# Patient Record
Sex: Male | Born: 1940 | Race: White | Hispanic: No | Marital: Married | Smoking: Former smoker
Health system: Southern US, Community
[De-identification: ages and names within clinical notes are randomized; demographics above are authoritative.]

## PROBLEM LIST (undated history)

## (undated) DIAGNOSIS — N400 Enlarged prostate without lower urinary tract symptoms: Secondary | ICD-10-CM

## (undated) DIAGNOSIS — K589 Irritable bowel syndrome without diarrhea: Secondary | ICD-10-CM

## (undated) DIAGNOSIS — K219 Gastro-esophageal reflux disease without esophagitis: Secondary | ICD-10-CM

## (undated) DIAGNOSIS — T7840XA Allergy, unspecified, initial encounter: Secondary | ICD-10-CM

## (undated) DIAGNOSIS — M109 Gout, unspecified: Secondary | ICD-10-CM

## (undated) DIAGNOSIS — F32A Depression, unspecified: Secondary | ICD-10-CM

## (undated) DIAGNOSIS — E785 Hyperlipidemia, unspecified: Secondary | ICD-10-CM

## (undated) DIAGNOSIS — M810 Age-related osteoporosis without current pathological fracture: Secondary | ICD-10-CM

## (undated) DIAGNOSIS — R718 Other abnormality of red blood cells: Secondary | ICD-10-CM

## (undated) DIAGNOSIS — F419 Anxiety disorder, unspecified: Secondary | ICD-10-CM

## (undated) DIAGNOSIS — Z8619 Personal history of other infectious and parasitic diseases: Secondary | ICD-10-CM

## (undated) DIAGNOSIS — N529 Male erectile dysfunction, unspecified: Secondary | ICD-10-CM

## (undated) DIAGNOSIS — M1990 Unspecified osteoarthritis, unspecified site: Secondary | ICD-10-CM

## (undated) DIAGNOSIS — H269 Unspecified cataract: Secondary | ICD-10-CM

## (undated) DIAGNOSIS — K635 Polyp of colon: Secondary | ICD-10-CM

## (undated) HISTORY — DX: Personal history of other infectious and parasitic diseases: Z86.19

## (undated) HISTORY — DX: Polyp of colon: K63.5

## (undated) HISTORY — DX: Anxiety disorder, unspecified: F41.9

## (undated) HISTORY — DX: Unspecified osteoarthritis, unspecified site: M19.90

## (undated) HISTORY — PX: POLYPECTOMY: SHX149

## (undated) HISTORY — DX: Gout, unspecified: M10.9

## (undated) HISTORY — DX: Unspecified cataract: H26.9

## (undated) HISTORY — PX: FRACTURE SURGERY: SHX138

## (undated) HISTORY — PX: BUNIONECTOMY: SHX129

## (undated) HISTORY — PX: CATARACT EXTRACTION, BILATERAL: SHX1313

## (undated) HISTORY — DX: Age-related osteoporosis without current pathological fracture: M81.0

## (undated) HISTORY — PX: COSMETIC SURGERY: SHX468

## (undated) HISTORY — PX: SHOULDER ARTHROSCOPY: SHX128

## (undated) HISTORY — PX: JOINT REPLACEMENT: SHX530

## (undated) HISTORY — PX: EYE SURGERY: SHX253

## (undated) HISTORY — DX: Irritable bowel syndrome, unspecified: K58.9

## (undated) HISTORY — DX: Hyperlipidemia, unspecified: E78.5

## (undated) HISTORY — PX: PROSTATE SURGERY: SHX751

## (undated) HISTORY — DX: Gastro-esophageal reflux disease without esophagitis: K21.9

## (undated) HISTORY — DX: Allergy, unspecified, initial encounter: T78.40XA

## (undated) HISTORY — DX: Depression, unspecified: F32.A

## (undated) HISTORY — PX: UMBILICAL HERNIA REPAIR: SHX196

## (undated) HISTORY — PX: INGUINAL HERNIA REPAIR: SUR1180

## (undated) HISTORY — PX: NOSE SURGERY: SHX723

## (undated) HISTORY — DX: Benign prostatic hyperplasia without lower urinary tract symptoms: N40.0

## (undated) HISTORY — PX: COLONOSCOPY: SHX174

## (undated) HISTORY — DX: Other abnormality of red blood cells: R71.8

---

## 1982-11-19 HISTORY — PX: OTHER SURGICAL HISTORY: SHX169

## 1985-11-19 HISTORY — PX: OTHER SURGICAL HISTORY: SHX169

## 1995-11-20 HISTORY — PX: CHOLECYSTECTOMY: SHX55

## 1998-11-19 DIAGNOSIS — A4902 Methicillin resistant Staphylococcus aureus infection, unspecified site: Secondary | ICD-10-CM

## 1998-11-19 HISTORY — DX: Methicillin resistant Staphylococcus aureus infection, unspecified site: A49.02

## 2000-11-19 HISTORY — PX: TONSILLECTOMY AND ADENOIDECTOMY: SUR1326

## 2006-02-05 ENCOUNTER — Ambulatory Visit: Payer: Self-pay | Admitting: Unknown Physician Specialty

## 2007-02-18 ENCOUNTER — Ambulatory Visit: Payer: Self-pay | Admitting: Otolaryngology

## 2007-02-18 ENCOUNTER — Other Ambulatory Visit: Payer: Self-pay

## 2007-02-25 ENCOUNTER — Ambulatory Visit: Payer: Self-pay | Admitting: Otolaryngology

## 2009-03-15 DIAGNOSIS — M109 Gout, unspecified: Secondary | ICD-10-CM | POA: Insufficient documentation

## 2009-03-29 ENCOUNTER — Ambulatory Visit: Payer: Self-pay | Admitting: Family Medicine

## 2009-03-29 DIAGNOSIS — R5381 Other malaise: Secondary | ICD-10-CM | POA: Insufficient documentation

## 2009-03-29 IMAGING — CR DG RIBS 2V*R*
1 series · 4 of 4 positions shown · non-contrast
Comparison: none

REASON FOR EXAM: Rib Pain
COMMENTS:

PROCEDURE:     KDR - KDXR RIBS RIGHT UNILATERAL  - [DATE] [DATE]
RESULT:     The images of the right ribs demonstrate no fracture,
dislocation or destructive bony lesion.

[Series 1: view not recorded · 0.17mm/px · 4 of 4 slices shown]
[im 1/4]
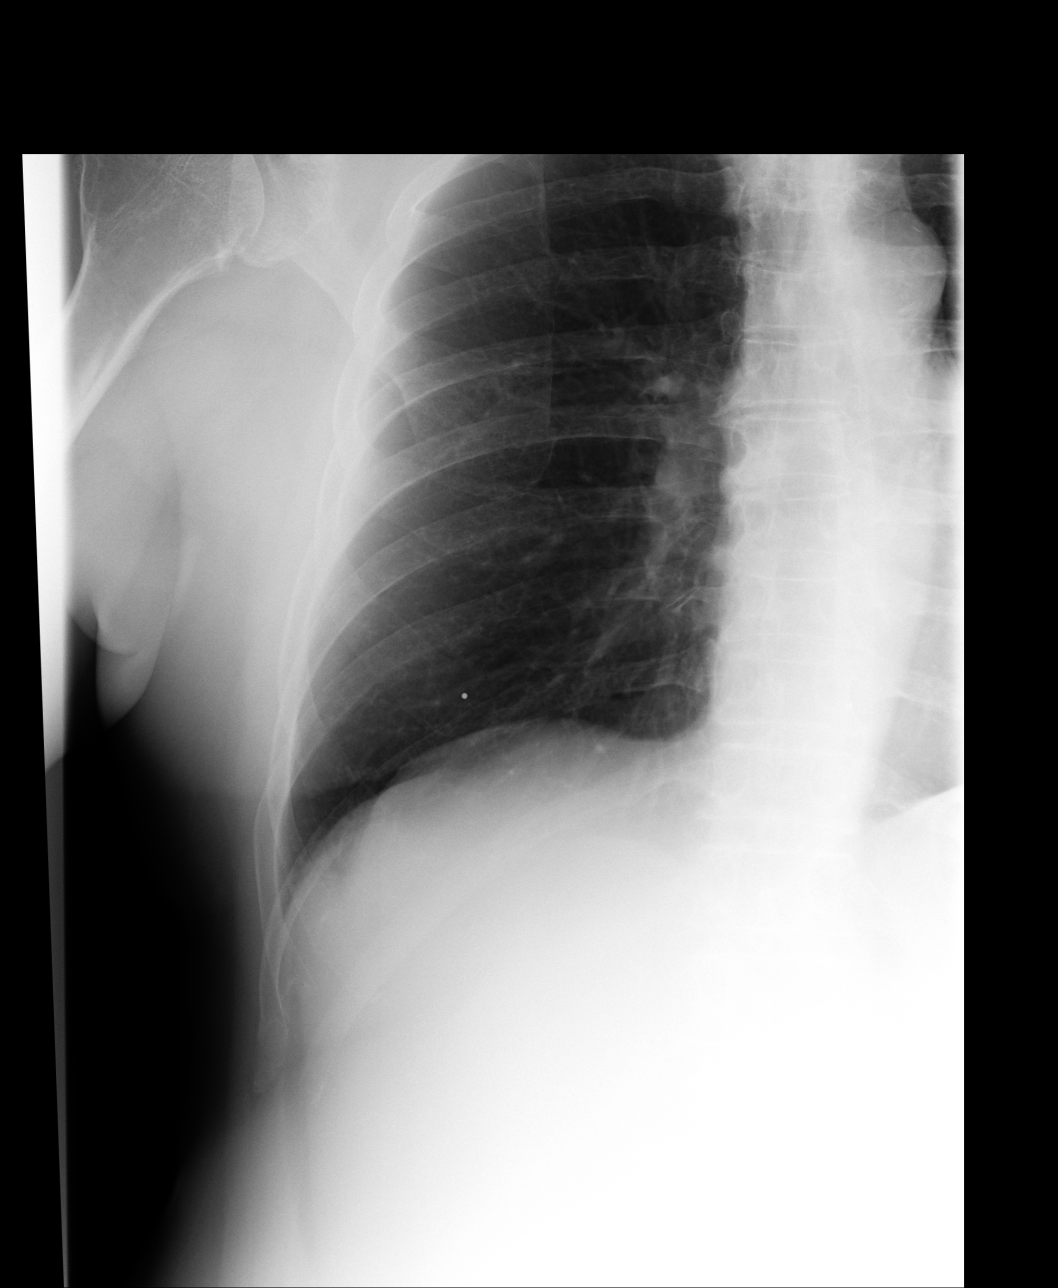
[im 2/4]
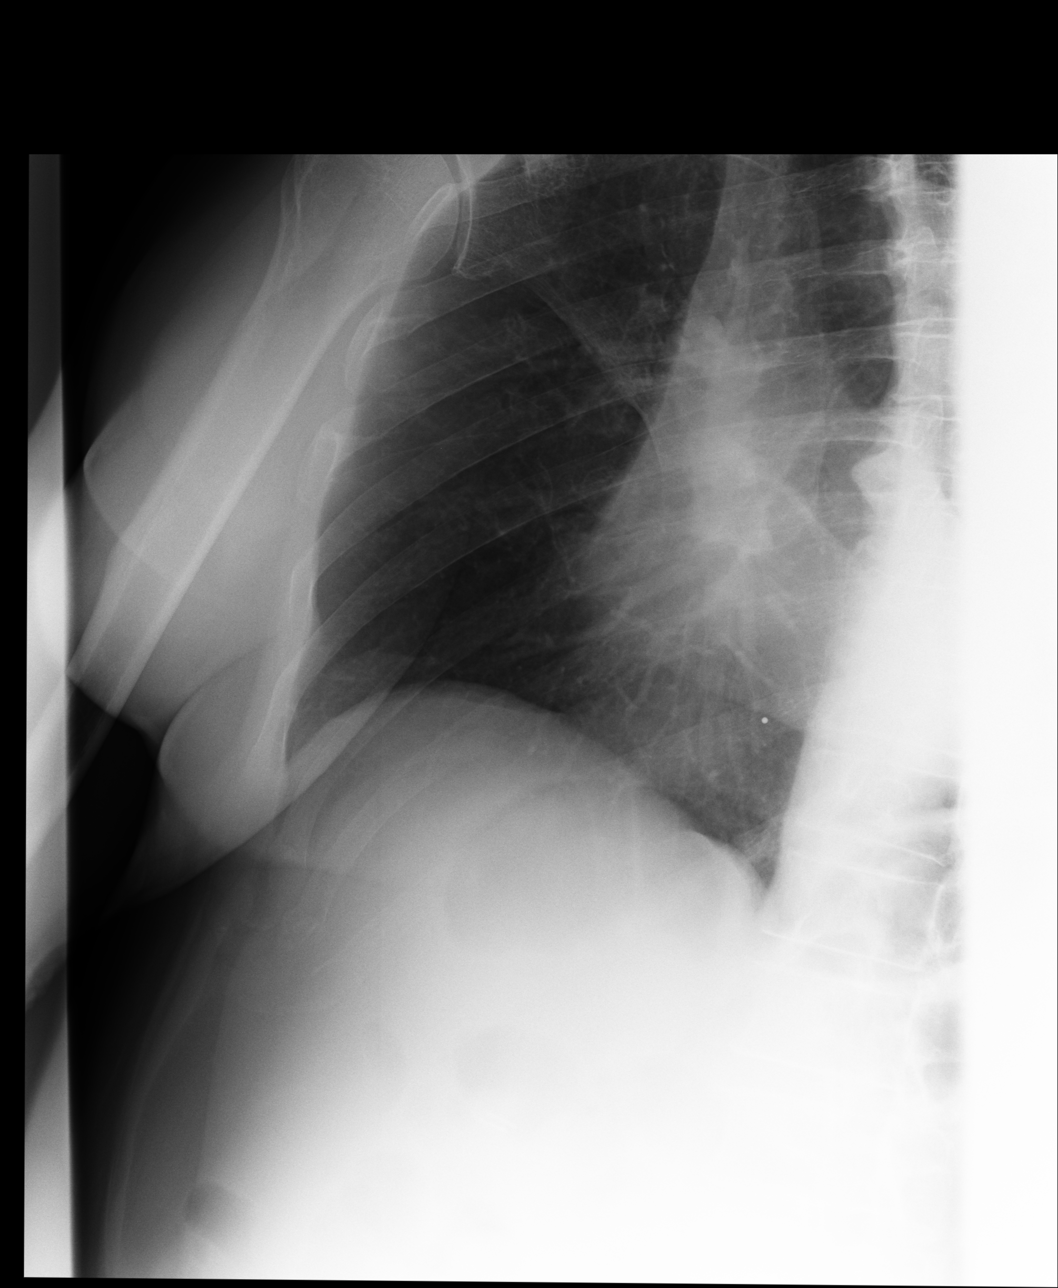
[im 3/4]
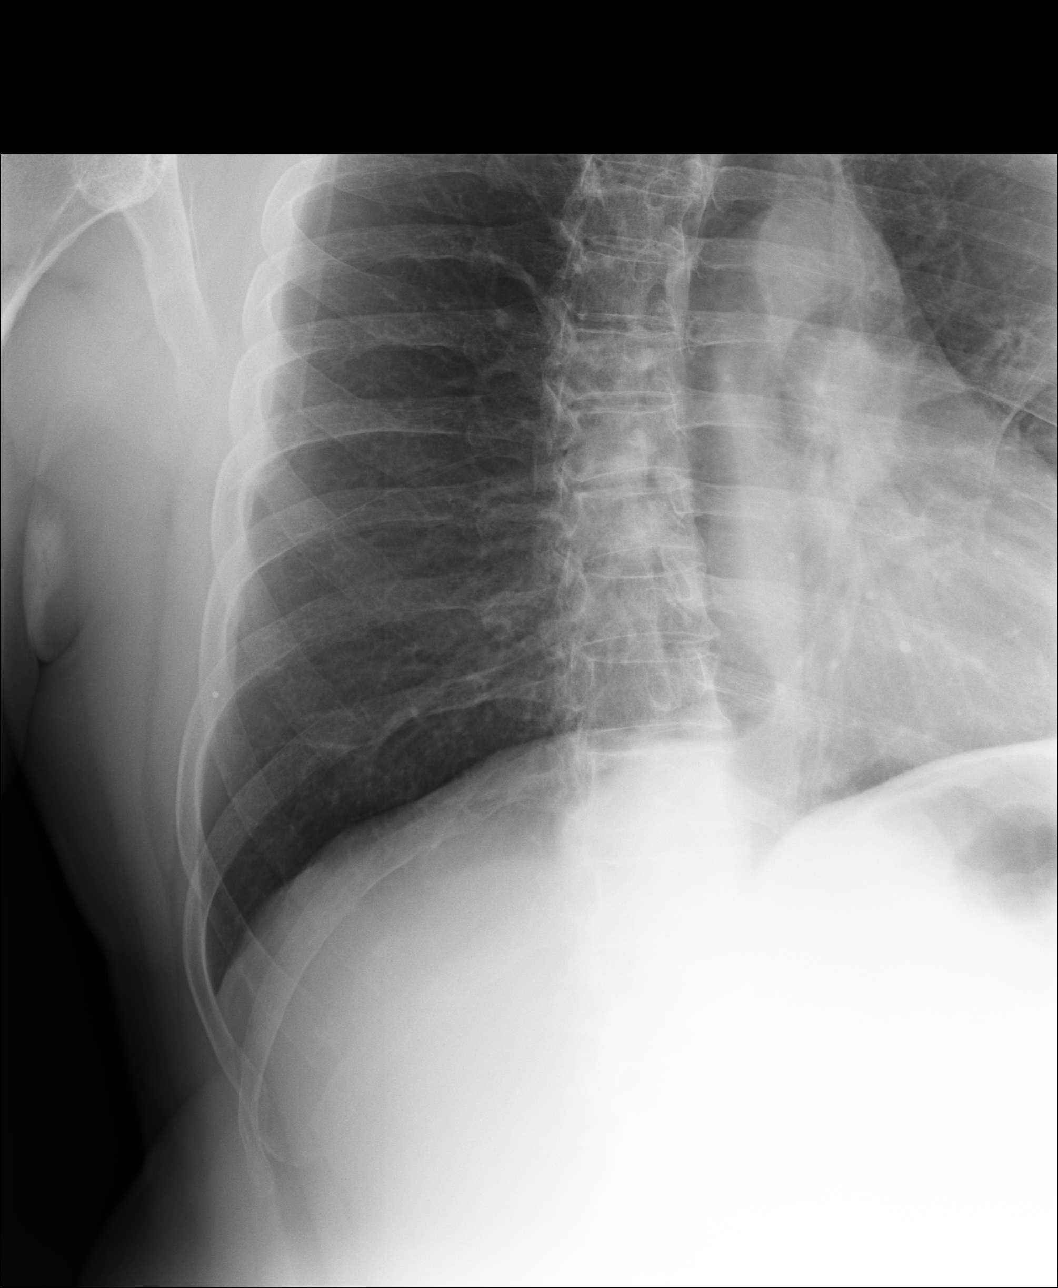
[im 4/4]
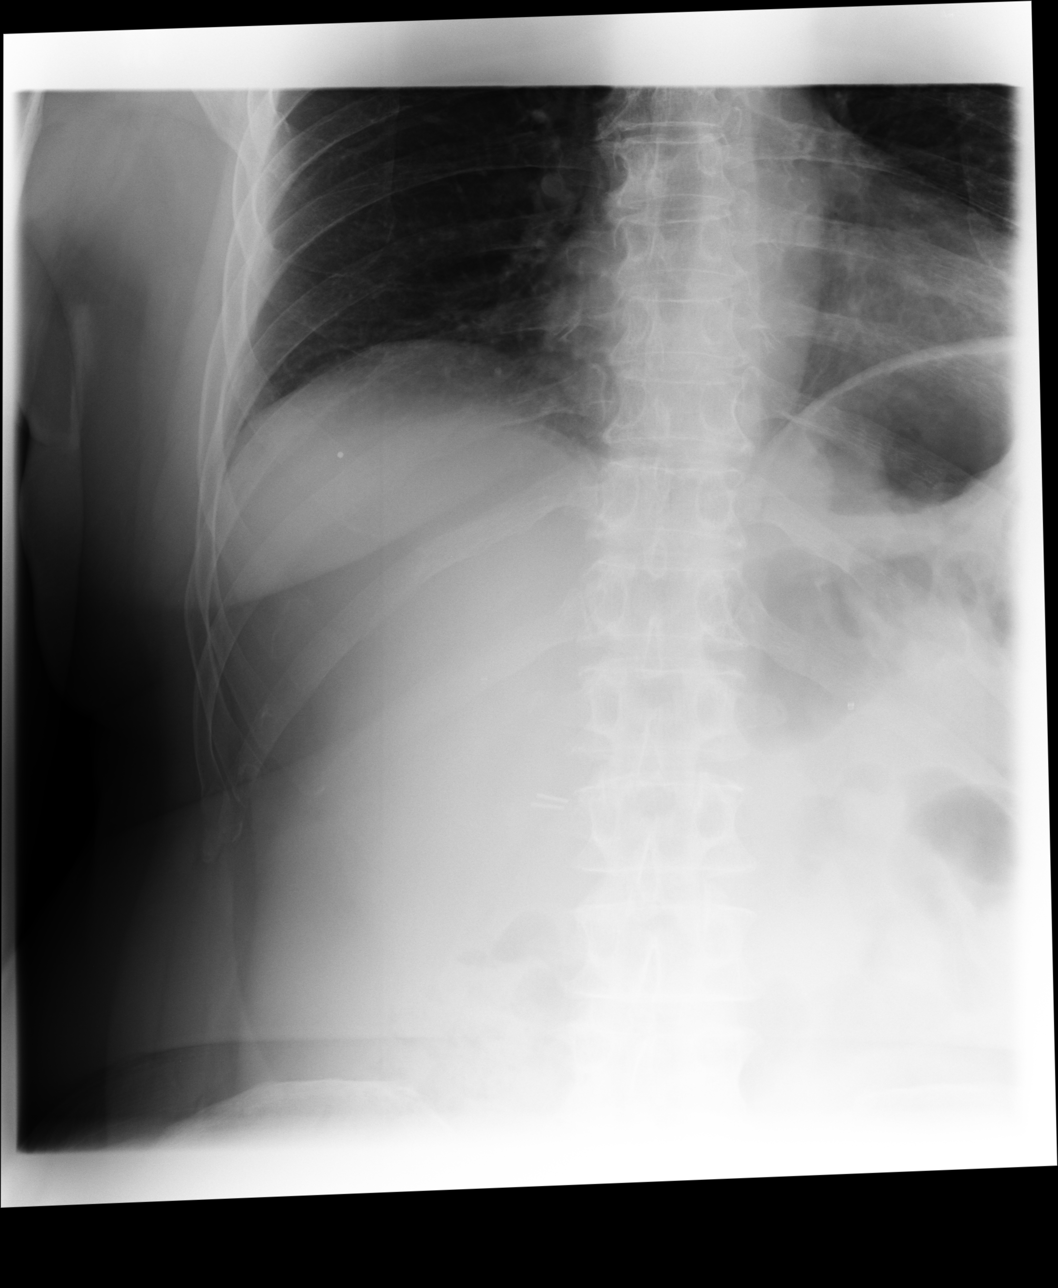

[4 of 4 positions shown; findings below may reference images not displayed]

IMPRESSION: No acute bony abnormality evident.

## 2009-03-29 IMAGING — CR DG THORACIC SPINE 2-3V
2 series · 3 of 3 positions shown · non-contrast
Comparison: none

REASON FOR EXAM: Back Pain
COMMENTS:

PROCEDURE:     KDR - KDXR THORACIC AP AND LATERAL  - [DATE] [DATE]
RESULT:     AP and lateral projections of the thoracic spine demonstrate no
compression fracture or subluxation. Hypertrophic degenerative endplate
spurring is demonstrated.

[view not recorded (1 of 2)]
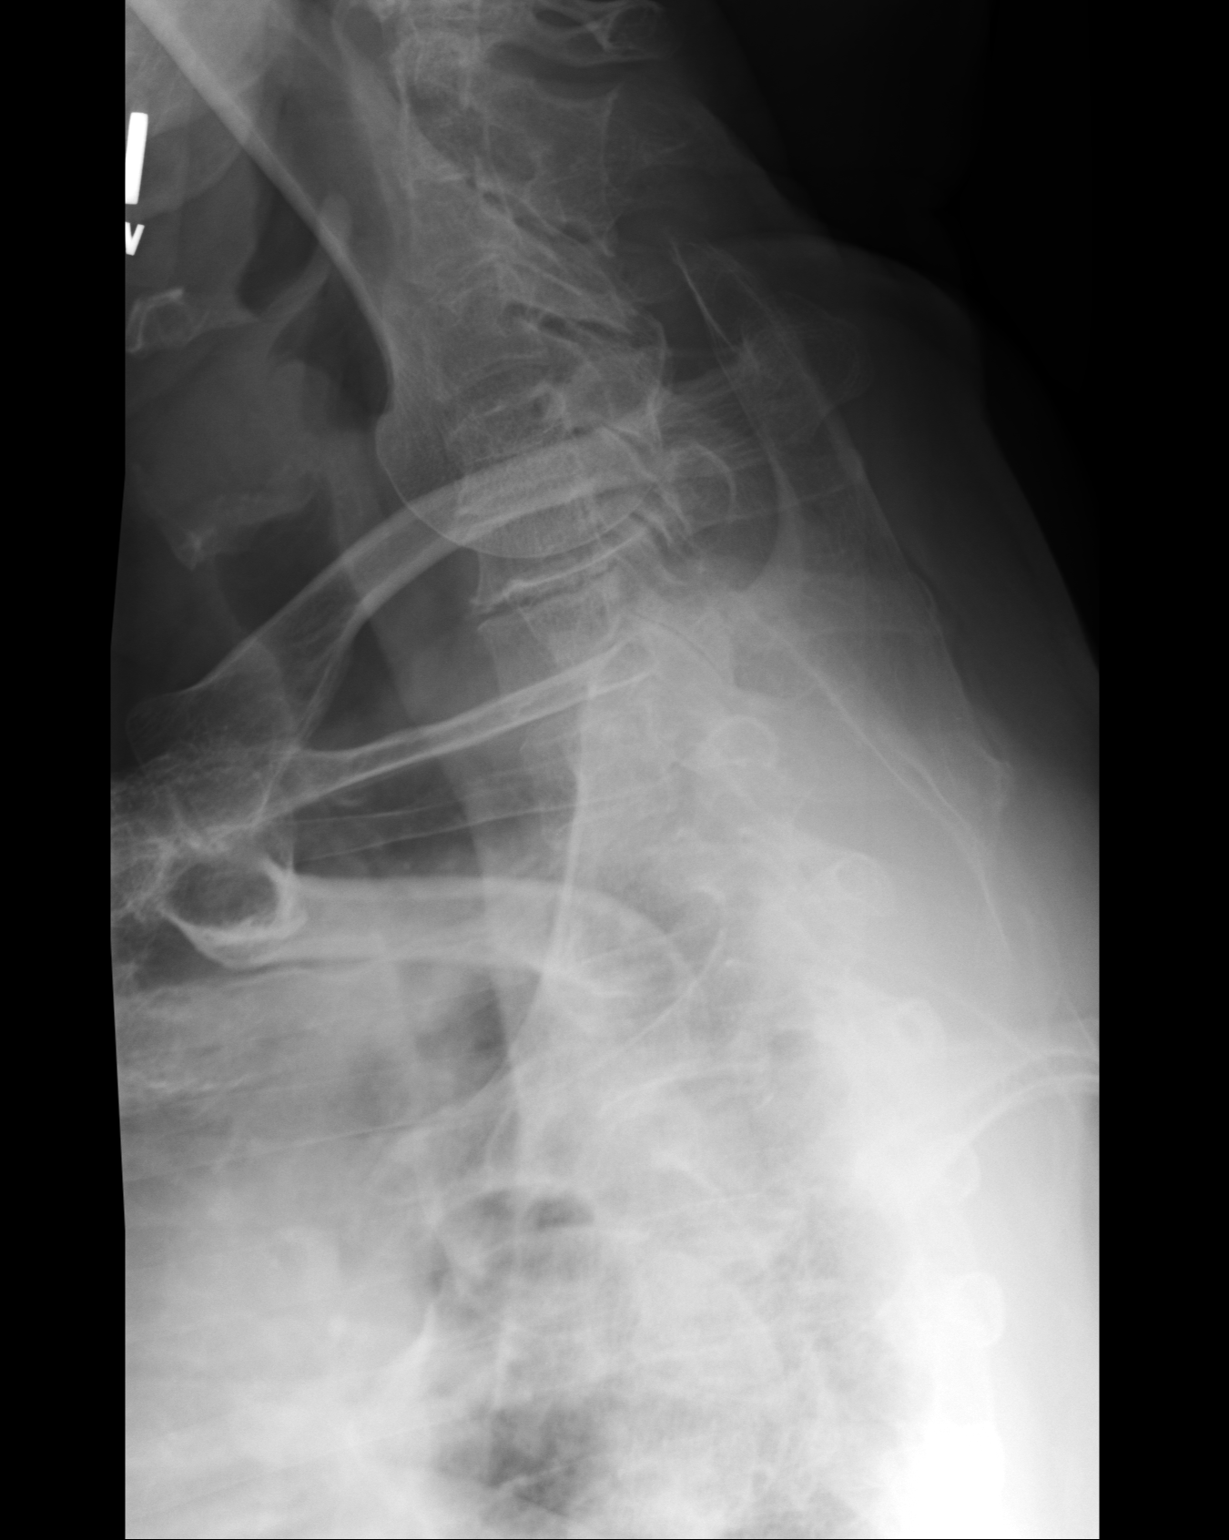

[Series 3: view not recorded · 0.17mm/px · 2 of 2 slices shown (2 of 2)]
[im 1/2]
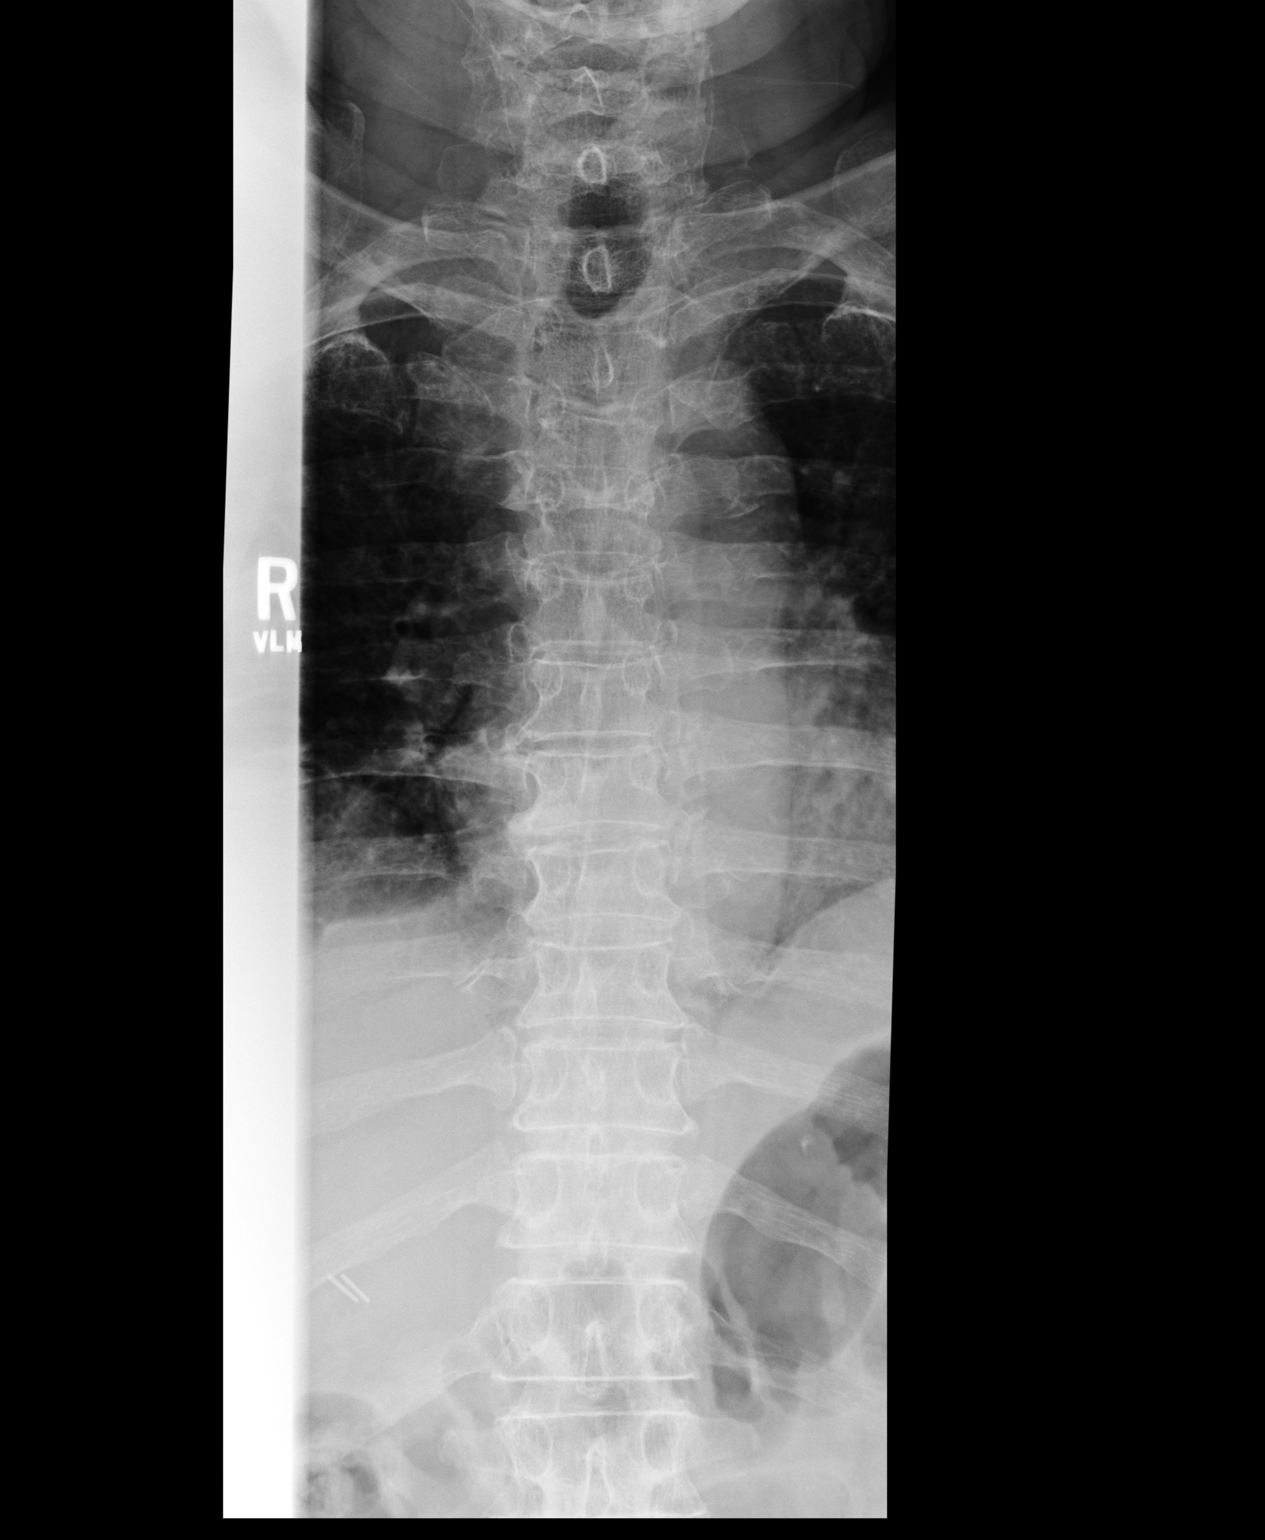
[im 2/2]
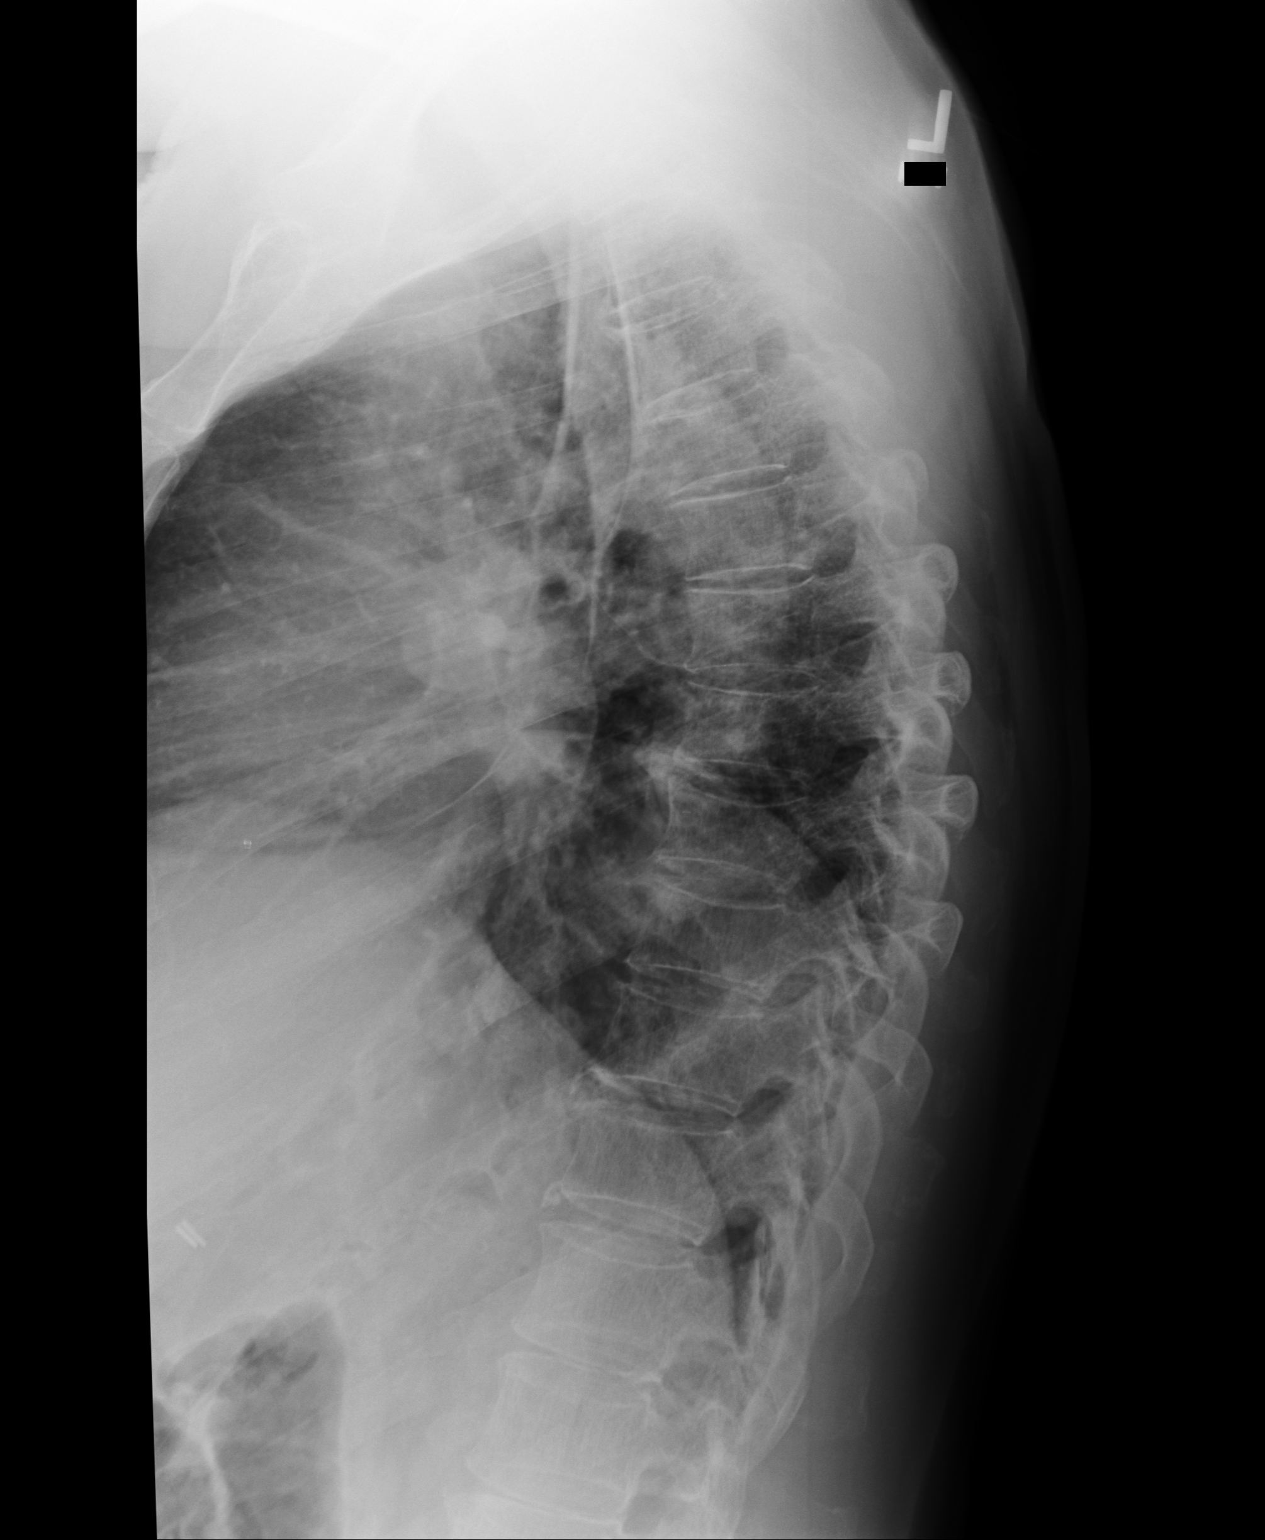

[3 of 3 positions shown; findings below may reference images not displayed]

IMPRESSION: Degenerative changes. No acute bony abnormality. MRI is
available for further investigation and to evaluate for early compression
fracture if this is a concern.

## 2009-03-31 DIAGNOSIS — E291 Testicular hypofunction: Secondary | ICD-10-CM | POA: Insufficient documentation

## 2009-06-16 DIAGNOSIS — N63 Unspecified lump in unspecified breast: Secondary | ICD-10-CM | POA: Insufficient documentation

## 2010-05-08 DIAGNOSIS — IMO0001 Reserved for inherently not codable concepts without codable children: Secondary | ICD-10-CM | POA: Insufficient documentation

## 2011-04-20 ENCOUNTER — Encounter: Payer: Self-pay | Admitting: Gastroenterology

## 2011-05-01 ENCOUNTER — Ambulatory Visit: Payer: Self-pay | Admitting: Urology

## 2011-05-18 ENCOUNTER — Ambulatory Visit (AMBULATORY_SURGERY_CENTER): Payer: Medicare Other | Admitting: *Deleted

## 2011-05-18 VITALS — Ht 68.0 in | Wt 188.0 lb

## 2011-05-18 DIAGNOSIS — Z86010 Personal history of colonic polyps: Secondary | ICD-10-CM

## 2011-05-18 MED ORDER — PEG-KCL-NACL-NASULF-NA ASC-C 100 G PO SOLR: ORAL | Status: DC

## 2011-05-18 NOTE — Progress Notes (Signed)
Request for health information form given to Marchelle Folks to obtain path report for colon done at [facility].

## 2011-06-05 ENCOUNTER — Telehealth: Payer: Self-pay | Admitting: Gastroenterology

## 2011-06-05 ENCOUNTER — Encounter: Payer: Self-pay | Admitting: Gastroenterology

## 2011-06-05 ENCOUNTER — Ambulatory Visit (AMBULATORY_SURGERY_CENTER): Payer: Medicare Other | Admitting: Gastroenterology

## 2011-06-05 VITALS — BP 157/79 | HR 70 | Temp 98.1°F | Resp 20 | Ht 68.0 in | Wt 185.0 lb

## 2011-06-05 DIAGNOSIS — K5730 Diverticulosis of large intestine without perforation or abscess without bleeding: Secondary | ICD-10-CM

## 2011-06-05 DIAGNOSIS — Z1211 Encounter for screening for malignant neoplasm of colon: Secondary | ICD-10-CM

## 2011-06-05 DIAGNOSIS — Z86010 Personal history of colonic polyps: Secondary | ICD-10-CM

## 2011-06-05 LAB — HM COLONOSCOPY

## 2011-06-05 MED ORDER — SODIUM CHLORIDE 0.9 % IV SOLN: 500.0000 mL | INTRAVENOUS | Status: DC

## 2011-06-05 NOTE — Patient Instructions (Signed)
Please follow the green and blue discharge instructions sheets the rest of the day.  See the picture page for your findings from the colon exam.  Call if any questions or concerns. MAW

## 2011-06-05 NOTE — Telephone Encounter (Signed)
Forwarded to Dr. Stark for Review. °

## 2011-06-05 NOTE — Progress Notes (Signed)
No complaints on discharge or in the recovery room. MAW

## 2011-06-06 ENCOUNTER — Telehealth: Payer: Self-pay

## 2011-06-06 NOTE — Telephone Encounter (Signed)

## 2011-11-20 HISTORY — PX: OTHER SURGICAL HISTORY: SHX169

## 2011-11-20 HISTORY — PX: LIPOMA EXCISION: SHX5283

## 2011-12-13 DIAGNOSIS — D1739 Benign lipomatous neoplasm of skin and subcutaneous tissue of other sites: Secondary | ICD-10-CM | POA: Diagnosis not present

## 2011-12-13 DIAGNOSIS — D235 Other benign neoplasm of skin of trunk: Secondary | ICD-10-CM | POA: Diagnosis not present

## 2011-12-13 DIAGNOSIS — D179 Benign lipomatous neoplasm, unspecified: Secondary | ICD-10-CM | POA: Diagnosis not present

## 2011-12-25 DIAGNOSIS — D485 Neoplasm of uncertain behavior of skin: Secondary | ICD-10-CM | POA: Diagnosis not present

## 2011-12-25 DIAGNOSIS — D235 Other benign neoplasm of skin of trunk: Secondary | ICD-10-CM | POA: Diagnosis not present

## 2011-12-25 DIAGNOSIS — L723 Sebaceous cyst: Secondary | ICD-10-CM | POA: Diagnosis not present

## 2012-01-14 DIAGNOSIS — N320 Bladder-neck obstruction: Secondary | ICD-10-CM | POA: Diagnosis not present

## 2012-01-14 DIAGNOSIS — R972 Elevated prostate specific antigen [PSA]: Secondary | ICD-10-CM | POA: Diagnosis not present

## 2012-01-14 DIAGNOSIS — N529 Male erectile dysfunction, unspecified: Secondary | ICD-10-CM | POA: Diagnosis not present

## 2012-04-21 DIAGNOSIS — N529 Male erectile dysfunction, unspecified: Secondary | ICD-10-CM | POA: Diagnosis not present

## 2012-04-21 DIAGNOSIS — R972 Elevated prostate specific antigen [PSA]: Secondary | ICD-10-CM | POA: Diagnosis not present

## 2012-04-21 DIAGNOSIS — N320 Bladder-neck obstruction: Secondary | ICD-10-CM | POA: Diagnosis not present

## 2012-04-24 DIAGNOSIS — H2510 Age-related nuclear cataract, unspecified eye: Secondary | ICD-10-CM | POA: Diagnosis not present

## 2012-05-19 DIAGNOSIS — L570 Actinic keratosis: Secondary | ICD-10-CM | POA: Diagnosis not present

## 2012-05-19 DIAGNOSIS — Z85828 Personal history of other malignant neoplasm of skin: Secondary | ICD-10-CM | POA: Diagnosis not present

## 2012-08-25 DIAGNOSIS — R972 Elevated prostate specific antigen [PSA]: Secondary | ICD-10-CM | POA: Insufficient documentation

## 2012-08-26 DIAGNOSIS — Z0000 Encounter for general adult medical examination without abnormal findings: Secondary | ICD-10-CM | POA: Diagnosis not present

## 2012-08-26 DIAGNOSIS — Z23 Encounter for immunization: Secondary | ICD-10-CM | POA: Diagnosis not present

## 2012-08-28 DIAGNOSIS — E781 Pure hyperglyceridemia: Secondary | ICD-10-CM | POA: Diagnosis not present

## 2012-08-28 DIAGNOSIS — E559 Vitamin D deficiency, unspecified: Secondary | ICD-10-CM | POA: Diagnosis not present

## 2013-02-05 DIAGNOSIS — J0100 Acute maxillary sinusitis, unspecified: Secondary | ICD-10-CM | POA: Diagnosis not present

## 2013-02-12 DIAGNOSIS — E781 Pure hyperglyceridemia: Secondary | ICD-10-CM | POA: Diagnosis not present

## 2013-02-16 DIAGNOSIS — E291 Testicular hypofunction: Secondary | ICD-10-CM | POA: Diagnosis not present

## 2013-02-16 DIAGNOSIS — R972 Elevated prostate specific antigen [PSA]: Secondary | ICD-10-CM | POA: Diagnosis not present

## 2013-02-18 DIAGNOSIS — F329 Major depressive disorder, single episode, unspecified: Secondary | ICD-10-CM | POA: Diagnosis not present

## 2013-02-18 DIAGNOSIS — E782 Mixed hyperlipidemia: Secondary | ICD-10-CM | POA: Diagnosis not present

## 2013-02-18 DIAGNOSIS — M109 Gout, unspecified: Secondary | ICD-10-CM | POA: Diagnosis not present

## 2013-03-11 DIAGNOSIS — M9981 Other biomechanical lesions of cervical region: Secondary | ICD-10-CM | POA: Diagnosis not present

## 2013-03-11 DIAGNOSIS — M5030 Other cervical disc degeneration, unspecified cervical region: Secondary | ICD-10-CM | POA: Diagnosis not present

## 2013-03-11 DIAGNOSIS — M999 Biomechanical lesion, unspecified: Secondary | ICD-10-CM | POA: Diagnosis not present

## 2013-03-11 DIAGNOSIS — IMO0002 Reserved for concepts with insufficient information to code with codable children: Secondary | ICD-10-CM | POA: Diagnosis not present

## 2013-03-18 DIAGNOSIS — M9981 Other biomechanical lesions of cervical region: Secondary | ICD-10-CM | POA: Diagnosis not present

## 2013-03-18 DIAGNOSIS — M999 Biomechanical lesion, unspecified: Secondary | ICD-10-CM | POA: Diagnosis not present

## 2013-03-18 DIAGNOSIS — IMO0002 Reserved for concepts with insufficient information to code with codable children: Secondary | ICD-10-CM | POA: Diagnosis not present

## 2013-03-18 DIAGNOSIS — M5030 Other cervical disc degeneration, unspecified cervical region: Secondary | ICD-10-CM | POA: Diagnosis not present

## 2013-04-21 DIAGNOSIS — H2510 Age-related nuclear cataract, unspecified eye: Secondary | ICD-10-CM | POA: Diagnosis not present

## 2013-05-14 DIAGNOSIS — L821 Other seborrheic keratosis: Secondary | ICD-10-CM | POA: Diagnosis not present

## 2013-05-14 DIAGNOSIS — D485 Neoplasm of uncertain behavior of skin: Secondary | ICD-10-CM | POA: Diagnosis not present

## 2013-05-14 DIAGNOSIS — L570 Actinic keratosis: Secondary | ICD-10-CM | POA: Diagnosis not present

## 2013-06-22 DIAGNOSIS — N400 Enlarged prostate without lower urinary tract symptoms: Secondary | ICD-10-CM | POA: Diagnosis not present

## 2013-06-22 DIAGNOSIS — M109 Gout, unspecified: Secondary | ICD-10-CM | POA: Diagnosis not present

## 2013-08-17 DIAGNOSIS — R972 Elevated prostate specific antigen [PSA]: Secondary | ICD-10-CM | POA: Diagnosis not present

## 2013-08-17 DIAGNOSIS — N529 Male erectile dysfunction, unspecified: Secondary | ICD-10-CM | POA: Diagnosis not present

## 2013-08-19 DIAGNOSIS — E291 Testicular hypofunction: Secondary | ICD-10-CM | POA: Diagnosis not present

## 2013-08-19 DIAGNOSIS — E559 Vitamin D deficiency, unspecified: Secondary | ICD-10-CM | POA: Diagnosis not present

## 2013-08-19 DIAGNOSIS — R5381 Other malaise: Secondary | ICD-10-CM | POA: Diagnosis not present

## 2013-08-19 DIAGNOSIS — M109 Gout, unspecified: Secondary | ICD-10-CM | POA: Diagnosis not present

## 2013-08-19 DIAGNOSIS — E782 Mixed hyperlipidemia: Secondary | ICD-10-CM | POA: Diagnosis not present

## 2013-08-19 DIAGNOSIS — Z23 Encounter for immunization: Secondary | ICD-10-CM | POA: Diagnosis not present

## 2013-09-08 DIAGNOSIS — L570 Actinic keratosis: Secondary | ICD-10-CM | POA: Diagnosis not present

## 2013-09-08 DIAGNOSIS — L538 Other specified erythematous conditions: Secondary | ICD-10-CM | POA: Diagnosis not present

## 2013-09-08 DIAGNOSIS — D235 Other benign neoplasm of skin of trunk: Secondary | ICD-10-CM | POA: Diagnosis not present

## 2013-10-28 DIAGNOSIS — R972 Elevated prostate specific antigen [PSA]: Secondary | ICD-10-CM | POA: Diagnosis not present

## 2013-10-28 DIAGNOSIS — N529 Male erectile dysfunction, unspecified: Secondary | ICD-10-CM | POA: Diagnosis not present

## 2013-11-27 DIAGNOSIS — N529 Male erectile dysfunction, unspecified: Secondary | ICD-10-CM | POA: Diagnosis not present

## 2013-11-27 DIAGNOSIS — N390 Urinary tract infection, site not specified: Secondary | ICD-10-CM | POA: Diagnosis not present

## 2013-12-10 DIAGNOSIS — Z5181 Encounter for therapeutic drug level monitoring: Secondary | ICD-10-CM | POA: Diagnosis not present

## 2013-12-10 DIAGNOSIS — Z79899 Other long term (current) drug therapy: Secondary | ICD-10-CM | POA: Diagnosis not present

## 2013-12-10 DIAGNOSIS — N529 Male erectile dysfunction, unspecified: Secondary | ICD-10-CM | POA: Diagnosis not present

## 2013-12-10 DIAGNOSIS — Z01818 Encounter for other preprocedural examination: Secondary | ICD-10-CM | POA: Diagnosis not present

## 2013-12-17 DIAGNOSIS — E291 Testicular hypofunction: Secondary | ICD-10-CM | POA: Diagnosis not present

## 2013-12-17 DIAGNOSIS — N529 Male erectile dysfunction, unspecified: Secondary | ICD-10-CM | POA: Diagnosis not present

## 2013-12-17 DIAGNOSIS — R972 Elevated prostate specific antigen [PSA]: Secondary | ICD-10-CM | POA: Diagnosis not present

## 2013-12-17 HISTORY — PX: PENILE PROSTHESIS IMPLANT: SHX240

## 2013-12-18 DIAGNOSIS — E291 Testicular hypofunction: Secondary | ICD-10-CM | POA: Diagnosis not present

## 2013-12-18 DIAGNOSIS — N529 Male erectile dysfunction, unspecified: Secondary | ICD-10-CM | POA: Diagnosis not present

## 2014-02-08 DIAGNOSIS — IMO0002 Reserved for concepts with insufficient information to code with codable children: Secondary | ICD-10-CM | POA: Diagnosis not present

## 2014-02-08 DIAGNOSIS — M75100 Unspecified rotator cuff tear or rupture of unspecified shoulder, not specified as traumatic: Secondary | ICD-10-CM | POA: Diagnosis not present

## 2014-02-12 DIAGNOSIS — M25519 Pain in unspecified shoulder: Secondary | ICD-10-CM | POA: Diagnosis not present

## 2014-02-12 DIAGNOSIS — M6281 Muscle weakness (generalized): Secondary | ICD-10-CM | POA: Diagnosis not present

## 2014-02-16 DIAGNOSIS — M25519 Pain in unspecified shoulder: Secondary | ICD-10-CM | POA: Diagnosis not present

## 2014-02-16 DIAGNOSIS — M6281 Muscle weakness (generalized): Secondary | ICD-10-CM | POA: Diagnosis not present

## 2014-02-18 DIAGNOSIS — M6281 Muscle weakness (generalized): Secondary | ICD-10-CM | POA: Diagnosis not present

## 2014-02-18 DIAGNOSIS — M25519 Pain in unspecified shoulder: Secondary | ICD-10-CM | POA: Diagnosis not present

## 2014-02-23 DIAGNOSIS — M6281 Muscle weakness (generalized): Secondary | ICD-10-CM | POA: Diagnosis not present

## 2014-02-23 DIAGNOSIS — M25519 Pain in unspecified shoulder: Secondary | ICD-10-CM | POA: Diagnosis not present

## 2014-04-20 DIAGNOSIS — H2510 Age-related nuclear cataract, unspecified eye: Secondary | ICD-10-CM | POA: Diagnosis not present

## 2014-04-26 DIAGNOSIS — H2510 Age-related nuclear cataract, unspecified eye: Secondary | ICD-10-CM | POA: Diagnosis not present

## 2014-04-26 DIAGNOSIS — H18419 Arcus senilis, unspecified eye: Secondary | ICD-10-CM | POA: Diagnosis not present

## 2014-04-26 DIAGNOSIS — H04129 Dry eye syndrome of unspecified lacrimal gland: Secondary | ICD-10-CM | POA: Diagnosis not present

## 2014-04-26 DIAGNOSIS — H02839 Dermatochalasis of unspecified eye, unspecified eyelid: Secondary | ICD-10-CM | POA: Diagnosis not present

## 2014-04-27 DIAGNOSIS — N529 Male erectile dysfunction, unspecified: Secondary | ICD-10-CM | POA: Diagnosis not present

## 2014-04-29 DIAGNOSIS — N529 Male erectile dysfunction, unspecified: Secondary | ICD-10-CM | POA: Diagnosis not present

## 2014-04-29 DIAGNOSIS — R972 Elevated prostate specific antigen [PSA]: Secondary | ICD-10-CM | POA: Diagnosis not present

## 2014-05-11 DIAGNOSIS — M109 Gout, unspecified: Secondary | ICD-10-CM | POA: Diagnosis not present

## 2014-05-11 DIAGNOSIS — R5381 Other malaise: Secondary | ICD-10-CM | POA: Diagnosis not present

## 2014-05-11 DIAGNOSIS — E559 Vitamin D deficiency, unspecified: Secondary | ICD-10-CM | POA: Diagnosis not present

## 2014-05-11 DIAGNOSIS — Z23 Encounter for immunization: Secondary | ICD-10-CM | POA: Diagnosis not present

## 2014-05-11 DIAGNOSIS — E782 Mixed hyperlipidemia: Secondary | ICD-10-CM | POA: Diagnosis not present

## 2014-05-11 DIAGNOSIS — Z0000 Encounter for general adult medical examination without abnormal findings: Secondary | ICD-10-CM | POA: Diagnosis not present

## 2014-05-11 DIAGNOSIS — R5383 Other fatigue: Secondary | ICD-10-CM | POA: Diagnosis not present

## 2014-05-11 DIAGNOSIS — F331 Major depressive disorder, recurrent, moderate: Secondary | ICD-10-CM | POA: Diagnosis not present

## 2014-05-13 DIAGNOSIS — E782 Mixed hyperlipidemia: Secondary | ICD-10-CM | POA: Diagnosis not present

## 2014-05-13 DIAGNOSIS — Z131 Encounter for screening for diabetes mellitus: Secondary | ICD-10-CM | POA: Diagnosis not present

## 2014-05-13 DIAGNOSIS — M109 Gout, unspecified: Secondary | ICD-10-CM | POA: Diagnosis not present

## 2014-05-13 DIAGNOSIS — E559 Vitamin D deficiency, unspecified: Secondary | ICD-10-CM | POA: Diagnosis not present

## 2014-05-13 LAB — LIPID PANEL
CHOLESTEROL: 222 mg/dL — AB (ref 0–200)
HDL: 43 mg/dL (ref 35–70)
LDL CALC: 149 mg/dL
TRIGLYCERIDES: 149 mg/dL (ref 40–160)

## 2014-05-25 DIAGNOSIS — IMO0001 Reserved for inherently not codable concepts without codable children: Secondary | ICD-10-CM | POA: Diagnosis not present

## 2014-05-25 DIAGNOSIS — E782 Mixed hyperlipidemia: Secondary | ICD-10-CM | POA: Diagnosis not present

## 2014-05-25 LAB — CBC AND DIFFERENTIAL
HEMATOCRIT: 38 % — AB (ref 41–53)
HEMOGLOBIN: 12.8 g/dL — AB (ref 13.5–17.5)
WBC: 7.9 10*3/mL

## 2014-05-25 LAB — BASIC METABOLIC PANEL
BUN: 22 mg/dL — AB (ref 4–21)
CREATININE: 1.3 mg/dL (ref 0.6–1.3)
Glucose: 85 mg/dL
Potassium: 4.8 mmol/L (ref 3.4–5.3)
Sodium: 134 mmol/L — AB (ref 137–147)

## 2014-05-25 LAB — HEPATIC FUNCTION PANEL
ALT: 17 U/L (ref 10–40)
AST: 19 U/L (ref 14–40)

## 2014-06-07 DIAGNOSIS — H269 Unspecified cataract: Secondary | ICD-10-CM | POA: Diagnosis not present

## 2014-06-07 DIAGNOSIS — H2510 Age-related nuclear cataract, unspecified eye: Secondary | ICD-10-CM | POA: Diagnosis not present

## 2014-06-08 DIAGNOSIS — H2510 Age-related nuclear cataract, unspecified eye: Secondary | ICD-10-CM | POA: Diagnosis not present

## 2014-06-21 DIAGNOSIS — H2510 Age-related nuclear cataract, unspecified eye: Secondary | ICD-10-CM | POA: Diagnosis not present

## 2014-06-21 DIAGNOSIS — H269 Unspecified cataract: Secondary | ICD-10-CM | POA: Diagnosis not present

## 2014-08-25 DIAGNOSIS — E782 Mixed hyperlipidemia: Secondary | ICD-10-CM | POA: Diagnosis not present

## 2014-08-25 DIAGNOSIS — Z23 Encounter for immunization: Secondary | ICD-10-CM | POA: Diagnosis not present

## 2014-08-25 DIAGNOSIS — F331 Major depressive disorder, recurrent, moderate: Secondary | ICD-10-CM | POA: Diagnosis not present

## 2014-09-21 DIAGNOSIS — Z79899 Other long term (current) drug therapy: Secondary | ICD-10-CM | POA: Diagnosis not present

## 2014-09-21 DIAGNOSIS — Z960 Presence of urogenital implants: Secondary | ICD-10-CM | POA: Diagnosis not present

## 2014-09-21 DIAGNOSIS — N342 Other urethritis: Secondary | ICD-10-CM | POA: Diagnosis not present

## 2014-11-25 ENCOUNTER — Ambulatory Visit: Payer: Self-pay | Admitting: Family Medicine

## 2014-11-25 DIAGNOSIS — M19041 Primary osteoarthritis, right hand: Secondary | ICD-10-CM | POA: Diagnosis not present

## 2014-11-25 DIAGNOSIS — H9319 Tinnitus, unspecified ear: Secondary | ICD-10-CM | POA: Diagnosis not present

## 2014-11-25 DIAGNOSIS — Z23 Encounter for immunization: Secondary | ICD-10-CM | POA: Diagnosis not present

## 2014-11-25 DIAGNOSIS — M79641 Pain in right hand: Secondary | ICD-10-CM | POA: Diagnosis not present

## 2014-11-25 DIAGNOSIS — F331 Major depressive disorder, recurrent, moderate: Secondary | ICD-10-CM | POA: Diagnosis not present

## 2014-12-23 DIAGNOSIS — L570 Actinic keratosis: Secondary | ICD-10-CM | POA: Diagnosis not present

## 2014-12-23 DIAGNOSIS — L821 Other seborrheic keratosis: Secondary | ICD-10-CM | POA: Diagnosis not present

## 2014-12-23 DIAGNOSIS — L814 Other melanin hyperpigmentation: Secondary | ICD-10-CM | POA: Diagnosis not present

## 2014-12-23 DIAGNOSIS — D225 Melanocytic nevi of trunk: Secondary | ICD-10-CM | POA: Diagnosis not present

## 2015-02-04 IMAGING — CR RIGHT HAND - COMPLETE 3+ VIEW
1 series · 3 of 3 positions shown · non-contrast
Comparison: None.

CLINICAL DATA: Right hand pain mostly in the second digit, no known
injury, initial encounter

EXAM:
RIGHT HAND - COMPLETE 3+ VIEW

[Series 1: kdxr hand rt complete w/obliques · 0.14mm/px · 3 of 3 slices shown]
[im 1/3]
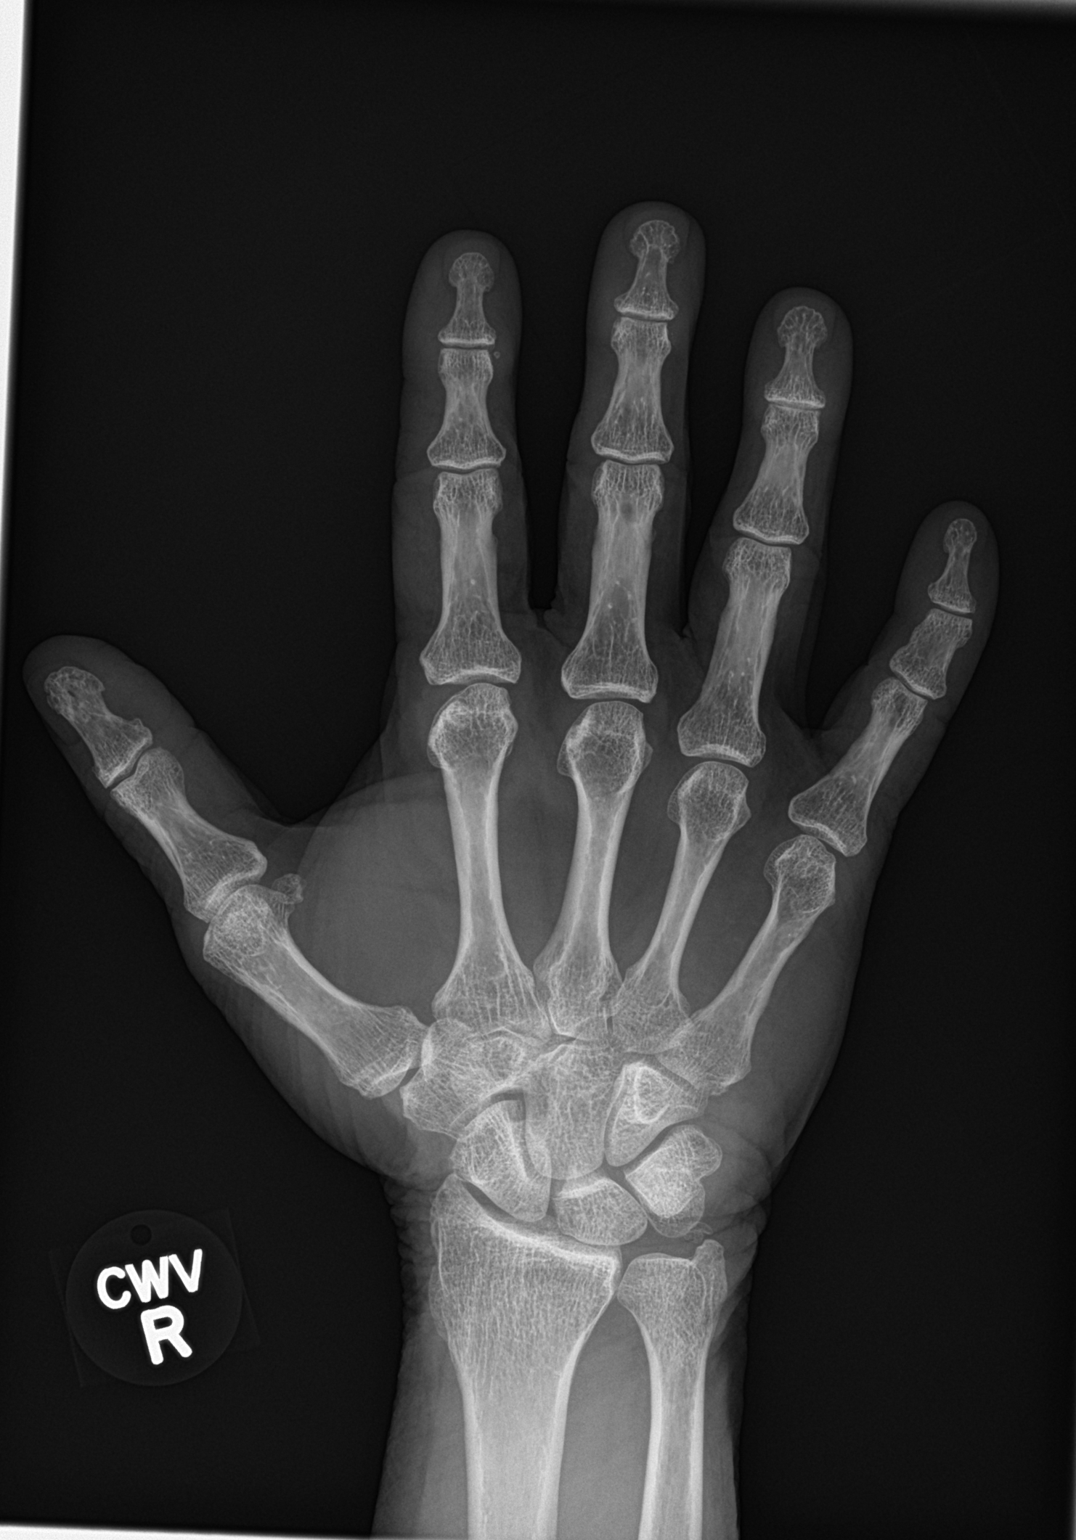
[im 2/3]
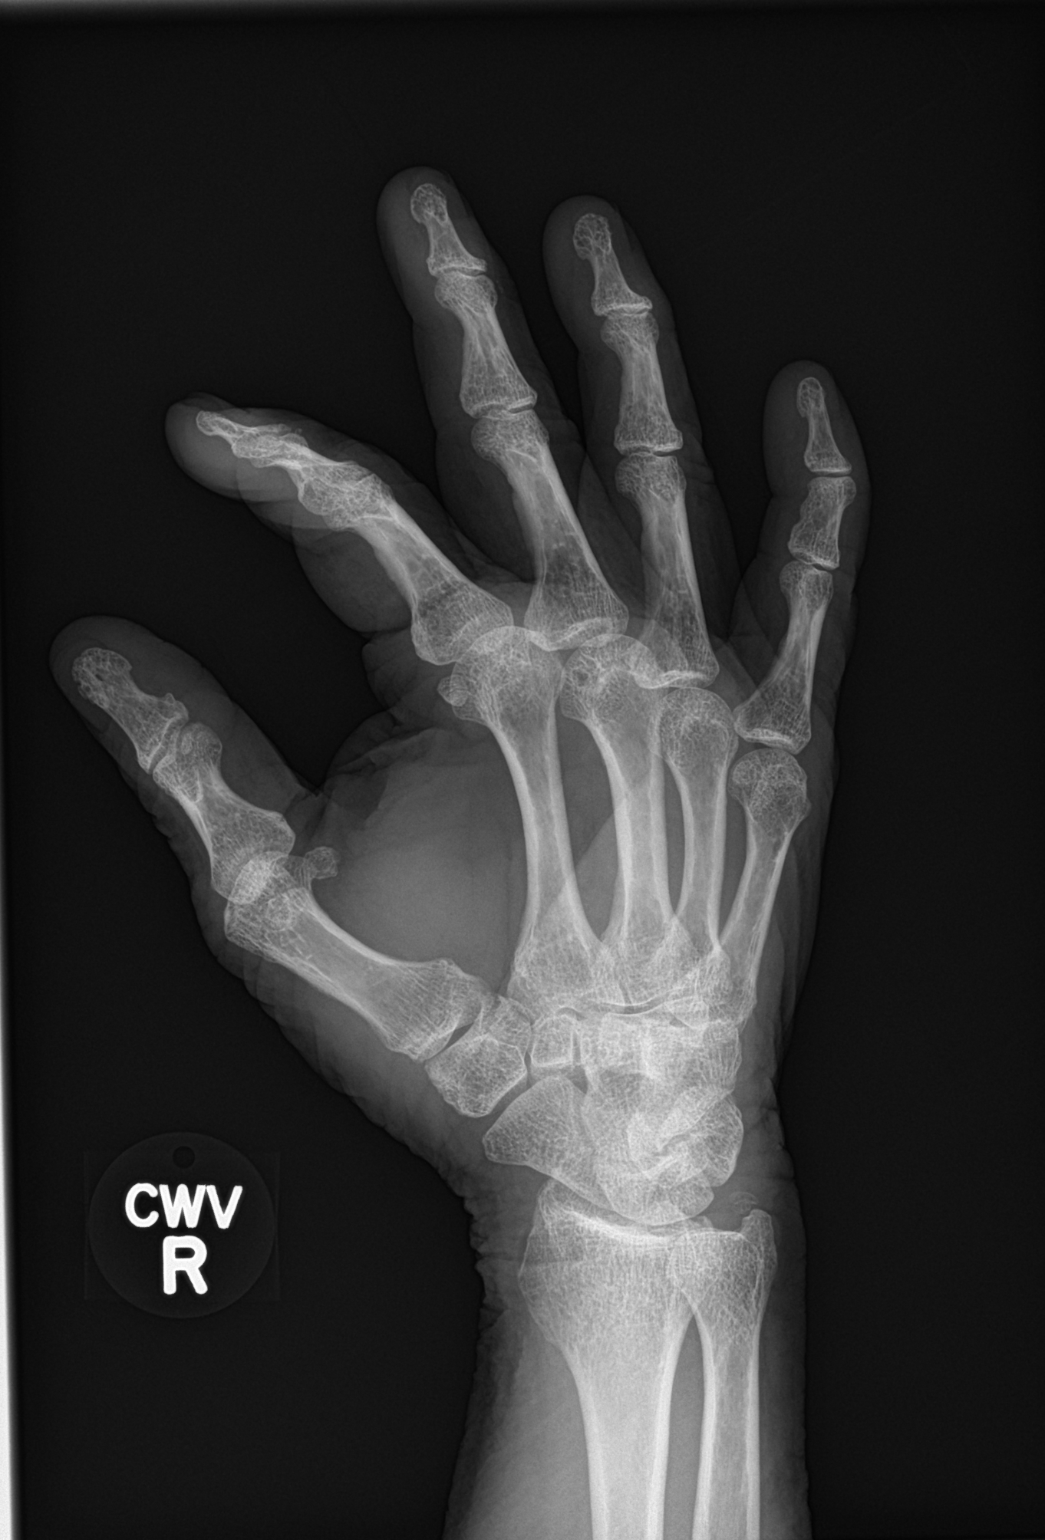
[im 3/3]
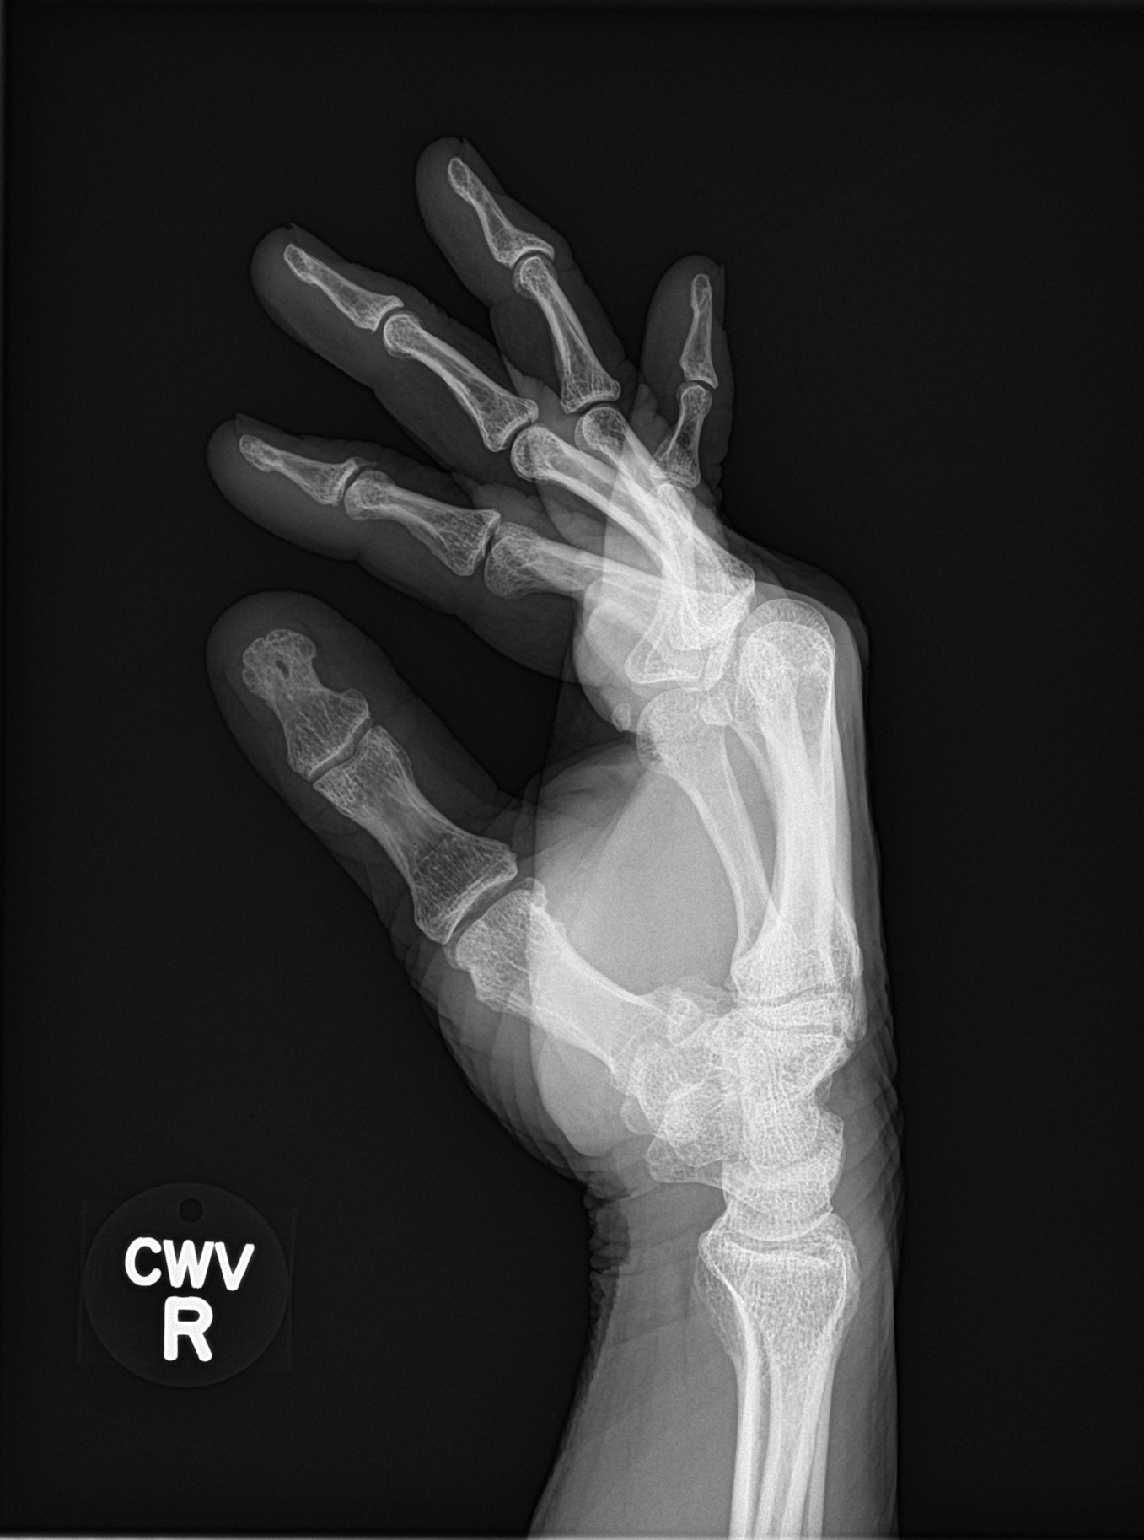

[3 of 3 positions shown; findings below may reference images not displayed]

FINDINGS: No acute fracture or dislocation is noted. Mild degenerative changes
are seen in the interphalangeal joints particularly of the fourth
DIP joint. No gross soft tissue abnormality is seen.
IMPRESSION: No acute abnormality noted.

## 2015-02-17 DIAGNOSIS — M545 Low back pain: Secondary | ICD-10-CM | POA: Diagnosis not present

## 2015-02-17 DIAGNOSIS — M5416 Radiculopathy, lumbar region: Secondary | ICD-10-CM | POA: Diagnosis not present

## 2015-02-21 DIAGNOSIS — M545 Low back pain: Secondary | ICD-10-CM | POA: Diagnosis not present

## 2015-02-23 DIAGNOSIS — M545 Low back pain: Secondary | ICD-10-CM | POA: Diagnosis not present

## 2015-03-01 DIAGNOSIS — M545 Low back pain: Secondary | ICD-10-CM | POA: Diagnosis not present

## 2015-03-03 DIAGNOSIS — M545 Low back pain: Secondary | ICD-10-CM | POA: Diagnosis not present

## 2015-03-07 DIAGNOSIS — M545 Low back pain: Secondary | ICD-10-CM | POA: Diagnosis not present

## 2015-03-10 DIAGNOSIS — M545 Low back pain: Secondary | ICD-10-CM | POA: Diagnosis not present

## 2015-03-14 DIAGNOSIS — M545 Low back pain: Secondary | ICD-10-CM | POA: Diagnosis not present

## 2015-03-17 DIAGNOSIS — M545 Low back pain: Secondary | ICD-10-CM | POA: Diagnosis not present

## 2015-03-20 DIAGNOSIS — M545 Low back pain: Secondary | ICD-10-CM | POA: Diagnosis not present

## 2015-03-22 DIAGNOSIS — M545 Low back pain: Secondary | ICD-10-CM | POA: Diagnosis not present

## 2015-04-11 DIAGNOSIS — N342 Other urethritis: Secondary | ICD-10-CM | POA: Diagnosis not present

## 2015-04-11 DIAGNOSIS — R399 Unspecified symptoms and signs involving the genitourinary system: Secondary | ICD-10-CM | POA: Diagnosis not present

## 2015-04-14 DIAGNOSIS — M8580 Other specified disorders of bone density and structure, unspecified site: Secondary | ICD-10-CM | POA: Diagnosis not present

## 2015-04-14 DIAGNOSIS — M545 Low back pain: Secondary | ICD-10-CM | POA: Diagnosis not present

## 2015-04-14 DIAGNOSIS — M25552 Pain in left hip: Secondary | ICD-10-CM | POA: Diagnosis not present

## 2015-04-14 DIAGNOSIS — M5136 Other intervertebral disc degeneration, lumbar region: Secondary | ICD-10-CM | POA: Diagnosis not present

## 2015-05-10 DIAGNOSIS — Z961 Presence of intraocular lens: Secondary | ICD-10-CM | POA: Diagnosis not present

## 2015-05-10 DIAGNOSIS — H35341 Macular cyst, hole, or pseudohole, right eye: Secondary | ICD-10-CM | POA: Diagnosis not present

## 2015-05-11 DIAGNOSIS — M47817 Spondylosis without myelopathy or radiculopathy, lumbosacral region: Secondary | ICD-10-CM | POA: Diagnosis not present

## 2015-09-26 DIAGNOSIS — M25541 Pain in joints of right hand: Secondary | ICD-10-CM | POA: Insufficient documentation

## 2015-09-26 DIAGNOSIS — Z8659 Personal history of other mental and behavioral disorders: Secondary | ICD-10-CM | POA: Insufficient documentation

## 2015-09-26 DIAGNOSIS — M7552 Bursitis of left shoulder: Secondary | ICD-10-CM | POA: Insufficient documentation

## 2015-09-26 DIAGNOSIS — E559 Vitamin D deficiency, unspecified: Secondary | ICD-10-CM | POA: Insufficient documentation

## 2015-09-26 DIAGNOSIS — N400 Enlarged prostate without lower urinary tract symptoms: Secondary | ICD-10-CM | POA: Insufficient documentation

## 2015-09-26 DIAGNOSIS — H9319 Tinnitus, unspecified ear: Secondary | ICD-10-CM | POA: Insufficient documentation

## 2015-09-26 DIAGNOSIS — F331 Major depressive disorder, recurrent, moderate: Secondary | ICD-10-CM | POA: Insufficient documentation

## 2015-09-26 DIAGNOSIS — L659 Nonscarring hair loss, unspecified: Secondary | ICD-10-CM | POA: Insufficient documentation

## 2015-09-28 ENCOUNTER — Encounter: Payer: Self-pay | Admitting: Family Medicine

## 2015-09-28 ENCOUNTER — Ambulatory Visit
Admission: RE | Admit: 2015-09-28 | Discharge: 2015-09-28 | Disposition: A | Discharge status: Home / Self Care | Payer: Medicare Other | Source: Ambulatory Visit | Attending: Family Medicine | Admitting: Family Medicine

## 2015-09-28 ENCOUNTER — Ambulatory Visit (INDEPENDENT_AMBULATORY_CARE_PROVIDER_SITE_OTHER): Payer: Medicare Other | Admitting: Family Medicine

## 2015-09-28 VITALS — BP 100/64 | HR 97 | Temp 97.6°F | Resp 16 | Wt 190.0 lb

## 2015-09-28 DIAGNOSIS — J309 Allergic rhinitis, unspecified: Secondary | ICD-10-CM | POA: Diagnosis not present

## 2015-09-28 DIAGNOSIS — M25561 Pain in right knee: Secondary | ICD-10-CM | POA: Diagnosis not present

## 2015-09-28 DIAGNOSIS — M179 Osteoarthritis of knee, unspecified: Secondary | ICD-10-CM | POA: Diagnosis not present

## 2015-09-28 DIAGNOSIS — Z23 Encounter for immunization: Secondary | ICD-10-CM

## 2015-09-28 IMAGING — CR DG KNEE COMPLETE 4+V*R*
1 series · 4 of 4 positions shown · non-contrast
Comparison: No prior exams available for comparison.

CLINICAL DATA: Chronic right knee pain. No known injury. Initial
evaluation.

EXAM:
RIGHT KNEE - COMPLETE 4+ VIEW

[Series 1: dg knee complete 4 views right · 0.14mm/px · 4 of 4 slices shown]
[im 1/4]
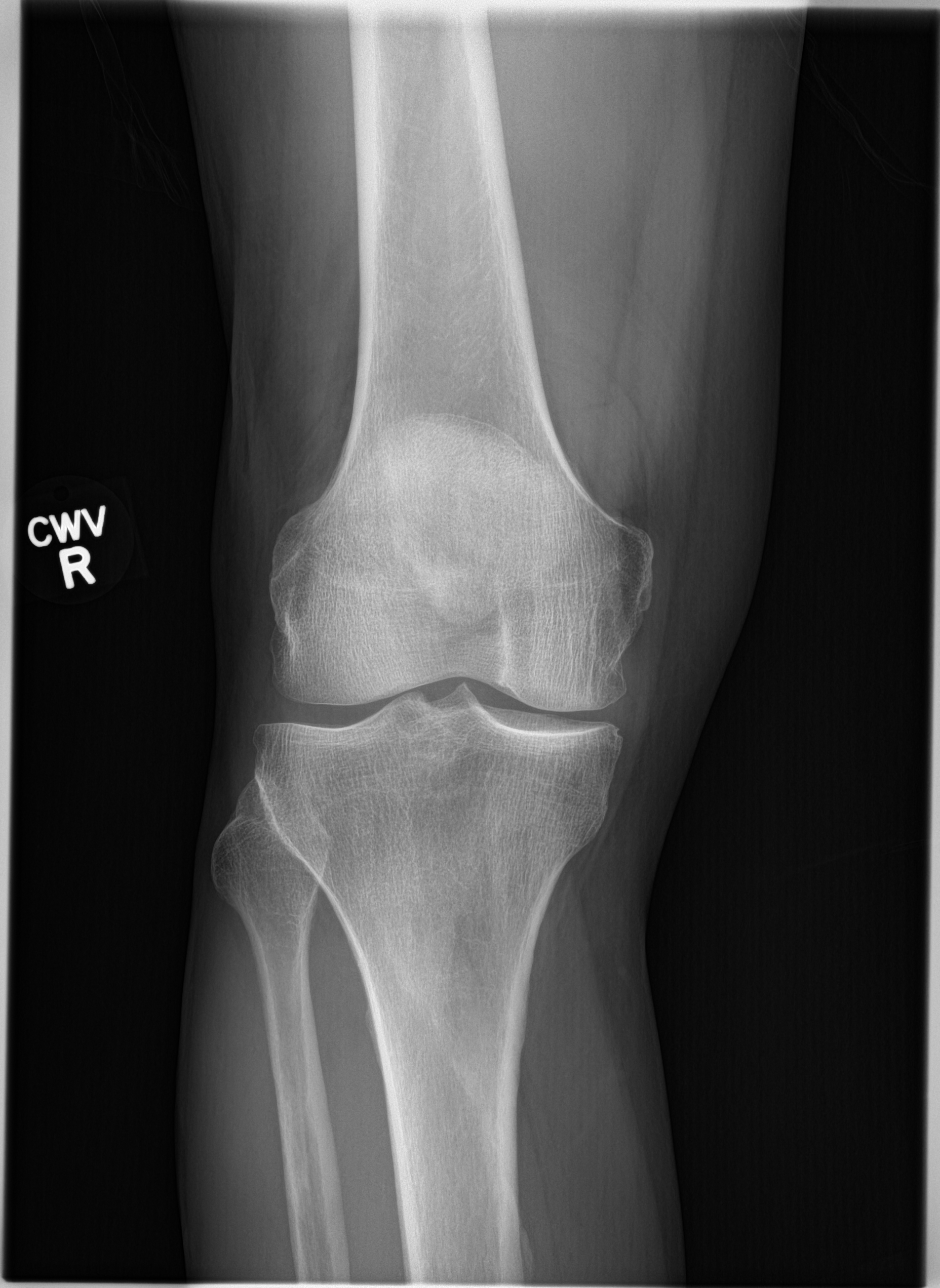
[im 2/4]
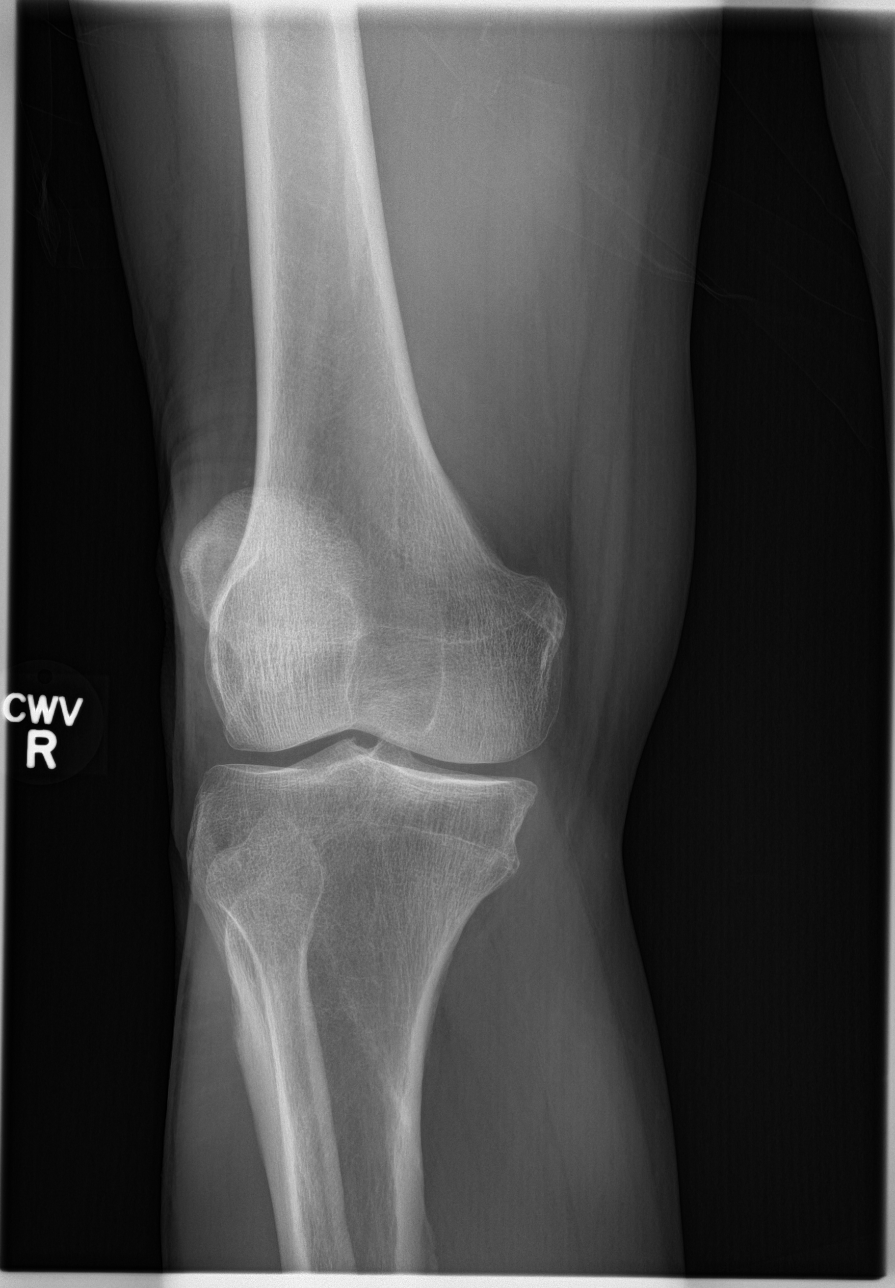
[im 3/4]
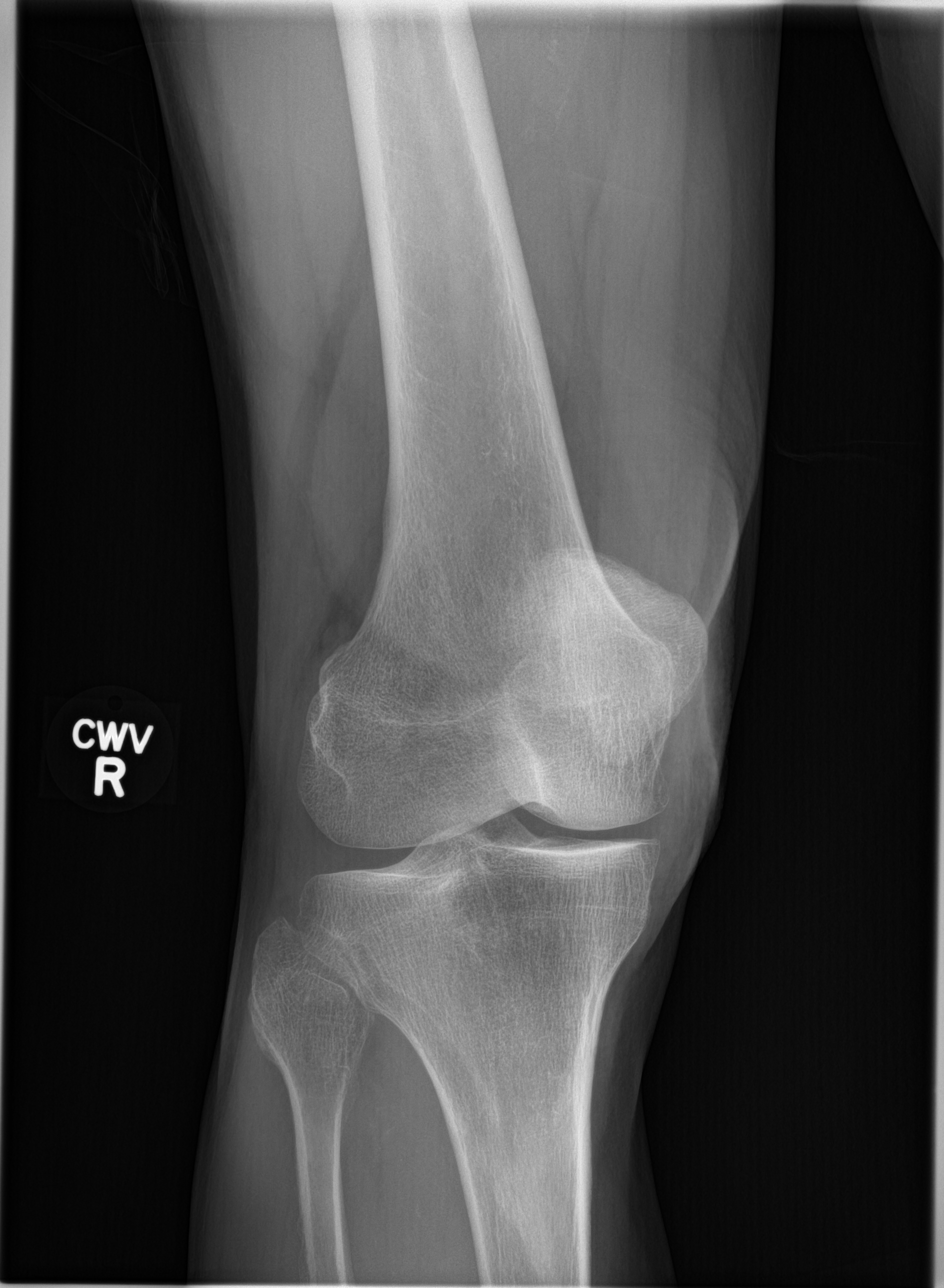
[im 4/4]
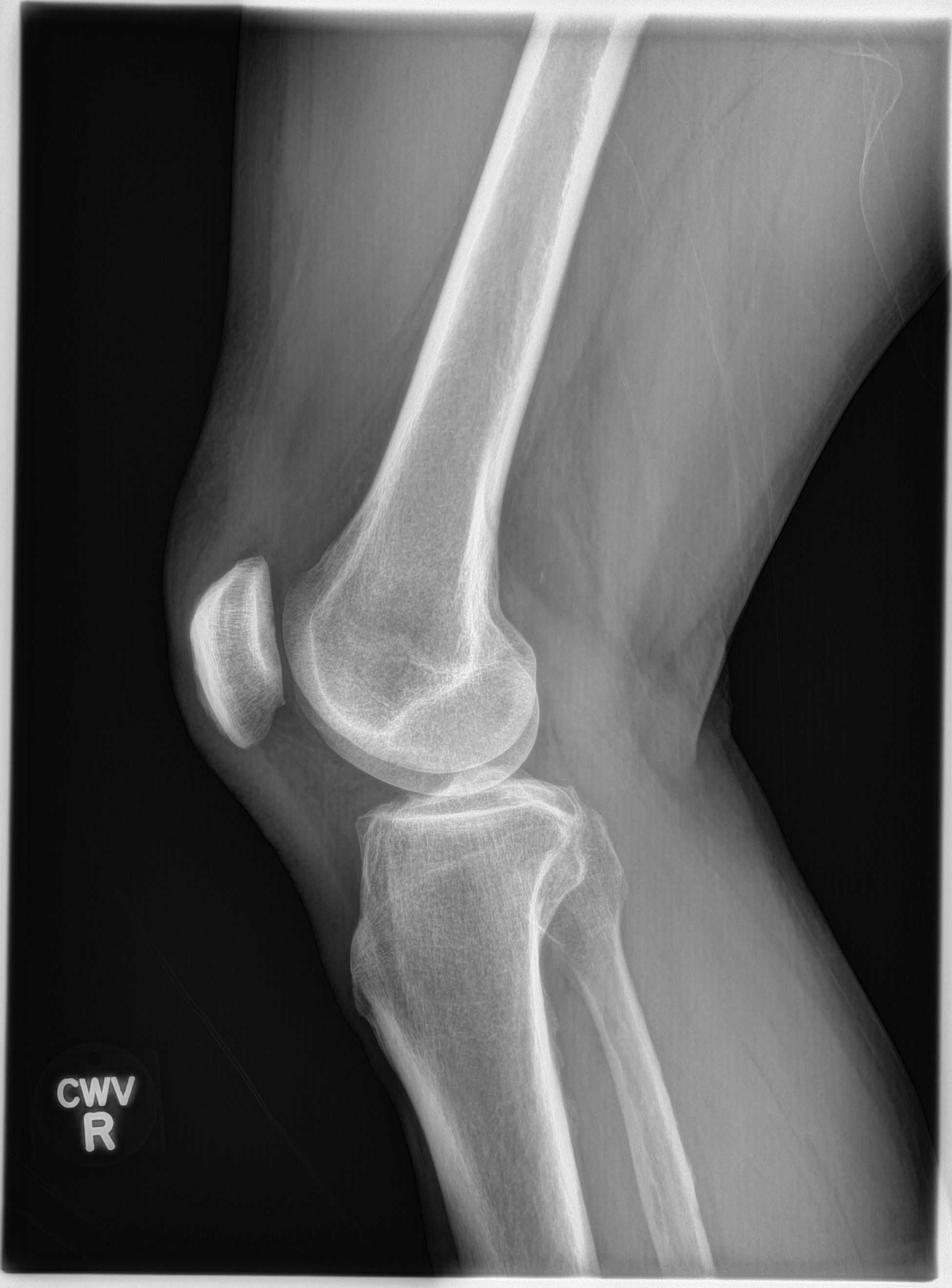

[4 of 4 positions shown; findings below may reference images not displayed]

FINDINGS: Small knee joint effusion cannot be excluded. Mild tricompartment
degenerative change. No acute abnormality. Atherosclerotic vascular
calcification.
IMPRESSION: 1. Mild tricompartment degenerative change, most prominent about the
medial compartment. Small knee joint effusion cannot be excluded. No
acute bony abnormality identified.

2. Atherosclerotic vascular disease.

## 2015-09-28 MED ORDER — NAPROXEN 500 MG PO TABS: 500.0000 mg | ORAL_TABLET | Freq: Two times a day (BID) | ORAL | Status: DC

## 2015-09-28 NOTE — Progress Notes (Signed)
Patient: Larry Crawford Male    DOB: 08-30-1941   74 y.o.   MRN: [id number] Visit Date: 09/28/2015  Today's Provider: Lelon Huh, MD   Chief Complaint  Patient presents with  . Cough   Subjective:    Cough This is a new problem. The current episode started more than 1 year ago. The problem has been unchanged. The cough is productive of sputum. Associated symptoms include wheezing. Pertinent negatives include no chest pain, ear pain, fever, headaches or shortness of breath. The symptoms are aggravated by dust and lying down. Treatments tried: allergy medication.   Also complains of 2 weeks of persistent right knee pain especially when going up stairs. No specific injuries. No knee swelling. No fevers, chills or sweats. Has not taken any OTC medications.     Allergies  Allergen Reactions  . Tetracyclines & Related Shortness Of Breath and Rash  . Colestipol Hcl     Other reaction(s): Diarrhea Fatigue  . Pravastatin Sodium     Other reaction(s): Muscle Pain  . Shellfish Allergy   . Sulfa Antibiotics Hives   Previous Medications   ACETYLCYSTEINE 600 MG CAPS    Take 1 capsule by mouth daily.   ALLOPURINOL (ZYLOPRIM) 300 MG TABLET    Take 300 mg by mouth daily.     ARGININE 500 MG TABLET    Take 1 tablet by mouth 2 (two) times daily.   CETIRIZINE (ZYRTEC) 10 MG TABLET    Take 10 mg by mouth 2 (two) times a week. Takes 2-3 times a week    CHOLECALCIFEROL (VITAMIN D) 1000 UNITS CAPSULE    Take 1,000 Units by mouth 2 (two) times daily.     COENZYME Q10 (CO Q 10) 100 MG CAPS    Take 1 capsule by mouth daily.     CYCLOSPORINE (RESTASIS) 0.05 % OPHTHALMIC EMULSION    Place 2 drops into both eyes 2 (two) times daily.   FISH OIL-OMEGA-3 FATTY ACIDS 1000 MG CAPSULE    Take 1 g by mouth 2 (two) times daily.     INOSITOL PO    Take 600 mg by mouth daily.     KELP 225 MCG TABS    Take 1 tablet by mouth 2 (two) times a week. Takes 2-3 times a week    LUTEIN 20 MG CAPS    Take 1  capsule by mouth daily.     MIRTAZAPINE (REMERON) 15 MG TABLET    Take 1 tablet by mouth at bedtime.   MULTIPLE VITAMINS-MINERALS (MULTIVITAMIN WITH MINERALS) TABLET    Take 1 tablet by mouth daily.     NIACIN 500 MG TABLET    Take 500 mg by mouth every other day.     PEG 3350 POWDER (MOVIPREP) 100 G SOLR    MOVI PREP take as directed   POMEGRANATE-CITRUS BIOFLAV PO    Take 2 capsules by mouth daily.   POTASSIUM CITRATE PO    Take 99 mg by mouth once a week. Takes 1-2 a week    PROBIOTIC CAPS    Take 1 capsule by mouth daily.   TADALAFIL (CIALIS) 5 MG TABLET    Take 1 tablet by mouth daily.   VITAMIN B-12 (CYANOCOBALAMIN) 250 MCG TABLET    Take 1 tablet by mouth daily.   WHEY PROTEIN POWD    Take 4 g by mouth daily.    Review of Systems  Constitutional: Negative for fever.  HENT: Negative for  ear pain.   Respiratory: Positive for cough and wheezing. Negative for shortness of breath.   Cardiovascular: Negative for chest pain.  Neurological: Negative for dizziness, light-headedness and headaches.    Social History  Substance Use Topics  . Smoking status: Former Smoker -- 0.75 packs/day for 5 years    Types: Cigarettes    Quit date: 11/19/1958  . Smokeless tobacco: Never Used  . Alcohol Use: 0.0 oz/week    0 Standard drinks or equivalent per week     Comment: 1 wine drink a month   Objective:   BP 100/64 mmHg  Pulse 97  Temp(Src) 97.6 F (36.4 C) (Oral)  Resp 16  Wt 190 lb (86.183 kg)  SpO2 97%  Physical Exam  General Appearance:    Alert, cooperative, no distress  HENT:   bilateral TM normal without fluid or infection, neck without nodes, pharynx erythematous without exudate, post nasal drip noted and nasal mucosa pale and congested  Eyes:    PERRL, conjunctiva/corneas clear, EOM's intact       Lungs:     Clear to auscultation bilaterally, respirations unlabored  Heart:    Regular rate and rhythm  Neurologic:   Awake, alert, oriented x 3. No apparent focal neurological            defect.   MS:     Tender over right medial joint line and anterior patella. Pain with active knee extension against resistance.    Dg Knee Complete 4 Views Right  09/28/2015  CLINICAL DATA:  Chronic right knee pain. No known injury. Initial evaluation. EXAM: RIGHT KNEE - COMPLETE 4+ VIEW COMPARISON:  No prior exams available for comparison. FINDINGS: Small knee joint effusion cannot be excluded. Mild tricompartment degenerative change. No acute abnormality. Atherosclerotic vascular calcification. IMPRESSION: 1. Mild tricompartment degenerative change, most prominent about the medial compartment. Small knee joint effusion cannot be excluded. No acute bony abnormality identified. 2. Atherosclerotic vascular disease. Electronically Signed   By: Marcello Moores  Register   On: 09/28/2015 10:55       Assessment & Plan:     1. Allergic rhinitis, unspecified allergic rhinitis type  Patient Instructions  Try taking OTC fexofenadine (Allegra) for cough and post nasal drainage If not effective then call for prescription for Singulair  Call if symptoms change or if not rapidly improving.    2. Knee pain, acute, right - DG Knee Complete 4 Views Right; Future - naproxen (NAPROSYN) 500 MG tablet; Take 1 tablet (500 mg total) by mouth 2 (two) times daily with a meal.  Dispense: 30 tablet; Refill: 0  3. Need for prophylactic vaccination and inoculation against influenza  - Flu vaccine HIGH DOSE PF       Lelon Huh, MD  [facility]

## 2015-09-28 NOTE — Patient Instructions (Signed)
Try taking OTC fexofenadine (Allegra) for cough and post nasal drainage If not effective then call for prescription for Singulair

## 2015-09-29 ENCOUNTER — Telehealth: Payer: Self-pay

## 2015-09-29 NOTE — Telephone Encounter (Signed)
Patient was notified of results. Patient expressed understanding. 

## 2015-09-29 NOTE — Telephone Encounter (Signed)
Left message to call back  

## 2015-09-29 NOTE — Telephone Encounter (Signed)
-----   Message from Birdie Sons, MD sent at 09/28/2015  4:35 PM EST ----- xrays show some mild arthritis, otherwise normal. Pain is probably from tendonitis. Need to start naprosyn 500mg  twice a day, have sent rx to pharmacy. Need to wear OTC elastic knee brace whenever walking. If not much better in 10-14 days then he needs to call for referral to orthopedics.

## 2015-09-30 ENCOUNTER — Encounter: Payer: Self-pay | Admitting: Family Medicine

## 2015-10-04 ENCOUNTER — Ambulatory Visit (INDEPENDENT_AMBULATORY_CARE_PROVIDER_SITE_OTHER): Payer: Medicare Other | Admitting: Family Medicine

## 2015-10-04 ENCOUNTER — Encounter: Payer: Self-pay | Admitting: Family Medicine

## 2015-10-04 VITALS — BP 102/70 | HR 60 | Temp 98.0°F | Resp 16 | Ht 67.5 in | Wt 192.0 lb

## 2015-10-04 DIAGNOSIS — E559 Vitamin D deficiency, unspecified: Secondary | ICD-10-CM | POA: Diagnosis not present

## 2015-10-04 DIAGNOSIS — E291 Testicular hypofunction: Secondary | ICD-10-CM

## 2015-10-04 DIAGNOSIS — R5382 Chronic fatigue, unspecified: Secondary | ICD-10-CM | POA: Diagnosis not present

## 2015-10-04 DIAGNOSIS — Z0000 Encounter for general adult medical examination without abnormal findings: Secondary | ICD-10-CM | POA: Diagnosis not present

## 2015-10-04 DIAGNOSIS — E782 Mixed hyperlipidemia: Secondary | ICD-10-CM | POA: Diagnosis not present

## 2015-10-04 DIAGNOSIS — Z125 Encounter for screening for malignant neoplasm of prostate: Secondary | ICD-10-CM | POA: Diagnosis not present

## 2015-10-04 NOTE — Progress Notes (Signed)
Patient: Larry Crawford, Male    DOB: 1941/08/20, 74 y.o.   MRN: [id number] Visit Date: 10/04/2015  Today's Provider: Lelon Huh, MD   Chief Complaint  Patient presents with  . Medicare Wellness  . Hyperlipidemia  . Gout  . Depression   Subjective:    Annual wellness visit Larry Crawford is a 74 y.o. male. He feels well. He reports exercising yes. He reports he is sleeping well.  ----------------------------------------------------------- Follow-up for gout from 05/13/2014; no changes. No recent gout flares.    Follow-up for depressive disorder from 11/25/2014,  He did not tolerate fluoxetine or bupropion in the past. He read an article about Adderall which he is interested in trying. He states he stays tired all the time and thinks Adderall will help.    Lipid/Cholesterol, Follow-up:   Last seen for this 08/25/2014.  Management changes since that visit include; discontinued cloestid due to diarrhea. Patient refused to start another cholesterol medication due to potential adverse effects. . Last Lipid Panel:    Component Value Date/Time   CHOL 222* 05/13/2014   TRIG 149 05/13/2014   HDL 43 05/13/2014   LDLCALC 149 05/13/2014    Risk factors for vascular disease include n/a  He reports good compliance with treatment. He is not having side effects.  Current symptoms include none and have been unchanged. Weight trend: stable Prior visit with dietician: no Current diet: well balanced Current exercise: walking  Wt Readings from Last 3 Encounters:  10/04/15 192 lb (87.091 kg)  09/28/15 190 lb (86.183 kg)  11/25/14 180 lb (81.647 kg)   -------------------------------------------------------------------  Hypogonadism. Was followed by Dr. Eliberto Ivory several years ago. Is on no medications.    Review of Systems  Constitutional: Negative.   HENT: Positive for sneezing and tinnitus.   Eyes: Positive for redness and itching.  Respiratory: Negative.     Cardiovascular: Negative.   Gastrointestinal: Negative.   Endocrine: Positive for cold intolerance.  Genitourinary: Negative.   Musculoskeletal: Positive for back pain and arthralgias.  Skin: Negative.   Allergic/Immunologic: Positive for food allergies.  Neurological: Negative.   Hematological: Bruises/bleeds easily.  Psychiatric/Behavioral: Negative.     Social History   Social History  . Marital Status: Married    Spouse Name: N/A  . Number of Children: 4  . Years of Education: N/A   Occupational History  . Not on file.   Social History Main Topics  . Smoking status: Former Smoker -- 0.75 packs/day for 5 years    Types: Cigarettes    Quit date: 11/19/1958  . Smokeless tobacco: Never Used  . Alcohol Use: 0.0 oz/week    0 Standard drinks or equivalent per week     Comment: 1 wine drink a month  . Drug Use: No  . Sexual Activity: Not on file   Other Topics Concern  . Not on file   Social History Narrative    Patient Active Problem List   Diagnosis Date Noted  .   09/28/2015  . Alopecia 09/26/2015  . Arthralgia of right hand 09/26/2015  . 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 Bursitis of left shoulder 09/26/2015  . Moderate episode of recurrent major depressive disorder ([place]) 09/26/2015  . Tinnitus 09/26/2015  . Vitamin D deficiency 09/26/2015  . 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 Elevated prostate specific antigen (PSA) 08/25/2012  . Myalgia and myositis 05/08/2010  . History of colon polyps 02/21/2010  . Breast lump 06/16/2009  .  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 IBS (irritable bowel syndrome) 03/29/2009  . Malaise and fatigue 03/29/2009  . Gout 03/15/2009  . Mixed hyperlipidemia 03/15/2009    Past Surgical History  Procedure Laterality Date  . Shoulder arthroscopy  1984 and 1987    left X2 for spurs  . Umbilical hernia repair    . Tonsillectomy and adenoidectomy  2002  . Nose surgery      divided septum  .  Bunionectomy      left  . Colonoscopy    . Polypectomy    . Lipoma excision  2013  . Cholecystectomy  1997  . 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 [place]  . Inguinal hernia repair  [phone]    2 on right/1 on left  . Aortic ultrasound  2013    normal. Screening ultrasound at [facility] per pt report   Family Status  Relation Status Death Age  . Mother Deceased 69    Cause of Death: Brain Tumor  . Father Deceased 21    Unknown cause of death    His family history includes Hypothyroidism in his mother.    Previous Medications   ACETYLCYSTEINE 600 MG CAPS    Take 1 capsule by mouth daily.   ALLOPURINOL (ZYLOPRIM) 300 MG TABLET    Take 300 mg by mouth daily.     ARGININE 500 MG TABLET    Take 1 tablet by mouth 2 (two) times daily.   CETIRIZINE (ZYRTEC) 10 MG TABLET    Take 10 mg by mouth 2 (two) times a week. Takes 2-3 times a week    CHOLECALCIFEROL (VITAMIN D) 1000 UNITS CAPSULE    Take 1,000 Units by mouth 2 (two) times daily.     CYCLOSPORINE (RESTASIS) 0.05 % OPHTHALMIC EMULSION    Place 2 drops into both eyes 2 (two) times daily.   FISH OIL-OMEGA-3 FATTY ACIDS 1000 MG CAPSULE    Take 1 g by mouth 2 (two) times daily.     INOSITOL PO    Take 600 mg by mouth daily.     KELP 225 MCG TABS    Take 1 tablet by mouth 2 (two) times a week. Takes 2-3 times a week    LUTEIN 20 MG CAPS    Take 1 capsule by mouth daily.     MULTIPLE VITAMINS-MINERALS (MULTIVITAMIN WITH MINERALS) TABLET    Take 1 tablet by mouth daily.     NAPROXEN (NAPROSYN) 500 MG TABLET    Take 1 tablet (500 mg total) by mouth 2 (two) times daily with a meal.   NIACIN 500 MG TABLET    Take 500 mg by mouth every other day.     PEG 3350 POWDER (MOVIPREP) 100 G SOLR    MOVI PREP take as directed   POMEGRANATE-CITRUS BIOFLAV PO    Take 2 capsules by mouth daily.   POTASSIUM CITRATE PO    Take 99 mg by mouth once a week. Takes 1-2 a week    PROBIOTIC CAPS    Take 1 capsule by mouth daily.   TADALAFIL (CIALIS) 5 MG TABLET     Take 1 tablet by mouth daily.   VITAMIN B-12 (CYANOCOBALAMIN) 250 MCG TABLET    Take 1 tablet by mouth daily.   WHEY PROTEIN POWD    Take 4 g by mouth daily.    Patient Care Team: Birdie Sons, MD as PCP - General (Family Medicine)  Objective:   Vitals: BP 102/70 mmHg  Pulse 60  Temp(Src) 98 F (36.7 C) (Oral)  Resp 16  Ht 5' 7.5" (1.715 m)  Wt 192 lb (87.091 kg)  BMI 29.61 kg/m2  SpO2 99%  Physical Exam   General Appearance:    Alert, cooperative, no distress, appears stated age  Head:    Normocephalic, without obvious abnormality, atraumatic  Eyes:    PERRL, conjunctiva/corneas clear, EOM's intact, fundi    benign, both eyes       Ears:    Normal TM's and external ear canals, both ears  Nose:   Nares normal, septum midline, mucosa normal, no drainage   or sinus tenderness  Throat:   Lips, mucosa, and tongue normal; teeth and gums normal  Neck:   Supple, symmetrical, trachea midline, no adenopathy;       thyroid:  No enlargement/tenderness/nodules; no carotid   bruit or JVD  Back:     Symmetric, no curvature, ROM normal, no CVA tenderness  Lungs:     Clear to auscultation bilaterally, respirations unlabored  Chest wall:    No tenderness or deformity  Heart:    Regular rate and rhythm, S1 and S2 normal, no murmur, rub   or gallop  Abdomen:     Soft, non-tender, bowel sounds active all four quadrants,    no masses, no organomegaly  Genitalia:    deferred  Rectal:    the prostate is of normal size and consistency, nontender, & without nodules  Extremities:   Extremities normal, atraumatic, no cyanosis or edema  Pulses:   2+ and symmetric all extremities  Skin:   Skin color, texture, turgor normal, no rashes or lesions  Lymph nodes:   Cervical, supraclavicular, and axillary nodes normal  Neurologic:   CNII-XII intact. Normal strength, sensation and reflexes      throughout      Activities of Daily Living In your present state of health, do you have any  difficulty performing the following activities: 10/04/2015  Hearing? Y  Vision? N  Difficulty concentrating or making decisions? Y  Walking or climbing stairs? N  Dressing or bathing? N  Doing errands, shopping? N    Fall Risk Assessment Fall Risk  10/04/2015  Falls in the past year? No     Depression Screen PHQ 2/9 Scores 10/04/2015  PHQ - 2 Score 4  PHQ- 9 Score 9    Cognitive Testing - 6-CIT  Correct? Score   What year is it? yes 0 0 or 4  What month is it? yes 0 0 or 3  Memorize:    Pia Mau,  41,  [place],      What time is it? (within 1 hour) yes 0 0 or 3  Count backwards from 20 yes 0 0, 2, or 4  Name the months of the year yes 0 0, 2, or 4  Repeat name & address above yes 3 0, 2, 4, 6, 8, or 10       TOTAL SCORE  3/28   Interpretation:  Normal  Normal (0-7) Abnormal (8-28)   Audit-C Alcohol Use Screening  Question Answer Points  How often do you have alcoholic drink? 2-4 times monthly 2  On days you do drink alcohol, how many drinks do you typically consume? 1 to 2 0  How oftey will you drink 6 or more in a total? never 0  Total Score:  2   A score of 3 or  more in women, and 4 or more in men indicates increased risk for alcohol abuse, EXCEPT if all of the points are from question 1.      Assessment & Plan:     Annual Wellness Visit  Reviewed patient's Family Medical History Reviewed and updated list of patient's medical providers Assessment of cognitive impairment was done Assessed patient's functional ability Established a written schedule for health screening [place] Completed and Reviewed  Exercise Activities and Dietary recommendations Goals    None      Immunization History  Administered Date(s) Administered  . Influenza, High Dose Seasonal PF 09/28/2015  . Pneumococcal Conjugate-13 05/11/2014  . Pneumococcal Polysaccharide-23 11/19/2004  . Tdap 08/25/2014  . Zoster 02/05/2013    Health  Maintenance  Topic Date Due  . COLONOSCOPY  06/04/2016  . INFLUENZA VACCINE  06/19/2016  . TETANUS/TDAP  08/25/2024  . ZOSTAVAX  Completed      Discussed health benefits of physical activity, and encouraged him to engage in regular exercise appropriate for his age and condition.    --------------------------------------------------------------------------------  1. Annual wellness visit  2. Vitamin D deficiency  - VITAMIN D 25 Hydroxy (Vit-D Deficiency, Fractures)  3. Mixed hyperlipidemia Intolerant to statins and cholestid. See how lipids look, consider metamucil or zetia.  - Lipid panel  4. Chronic fatigue Advised patient of cardiac risks of stimulant medications such as Adderall. Considering that he will not take statins or ASA (which I recommended he take) Adderall would present an unacceptablet cardiac risk . - CBC - T3 - T3, free - T4, free - TSH - Comprehensive metabolic panel  5. Prostate cancer screening  - PSA  6. Hypogonadism, testicular  - Testosterone,Free and Total

## 2015-10-05 LAB — COMPREHENSIVE METABOLIC PANEL
A/G RATIO: 2.2 (ref 1.1–2.5)
ALBUMIN: 4.7 g/dL (ref 3.5–4.8)
ALK PHOS: 77 IU/L (ref 39–117)
ALT: 17 IU/L (ref 0–44)
AST: 20 IU/L (ref 0–40)
BUN / CREAT RATIO: 12 (ref 10–22)
BUN: 14 mg/dL (ref 8–27)
Bilirubin Total: 0.4 mg/dL (ref 0.0–1.2)
CO2: 24 mmol/L (ref 18–29)
CREATININE: 1.2 mg/dL (ref 0.76–1.27)
Calcium: 10.1 mg/dL (ref 8.6–10.2)
Chloride: 98 mmol/L (ref 97–106)
GFR calc Af Amer: 68 mL/min/{1.73_m2} (ref >59)
GFR, EST NON AFRICAN AMERICAN: 59 mL/min/{1.73_m2} — AB (ref >59)
GLOBULIN, TOTAL: 2.1 g/dL (ref 1.5–4.5)
Glucose: 101 mg/dL — ABNORMAL HIGH (ref 65–99)
POTASSIUM: 5.3 mmol/L — AB (ref 3.5–5.2)
SODIUM: 139 mmol/L (ref 136–144)
Total Protein: 6.8 g/dL (ref 6.0–8.5)

## 2015-10-05 LAB — LIPID PANEL
CHOL/HDL RATIO: 5.7 ratio — AB (ref 0.0–5.0)
Cholesterol, Total: 239 mg/dL — ABNORMAL HIGH (ref 100–199)
HDL: 42 mg/dL (ref >39)
LDL CALC: 153 mg/dL — AB (ref 0–99)
Triglycerides: 221 mg/dL — ABNORMAL HIGH (ref 0–149)
VLDL CHOLESTEROL CAL: 44 mg/dL — AB (ref 5–40)

## 2015-10-05 LAB — CBC
HEMATOCRIT: 41.5 % (ref 37.5–51.0)
Hemoglobin: 14.3 g/dL (ref 12.6–17.7)
MCH: 31.3 pg (ref 26.6–33.0)
MCHC: 34.5 g/dL (ref 31.5–35.7)
MCV: 91 fL (ref 79–97)
PLATELETS: 412 10*3/uL — AB (ref 150–379)
RBC: 4.57 x10E6/uL (ref 4.14–5.80)
RDW: 13.4 % (ref 12.3–15.4)
WBC: 7.2 10*3/uL (ref 3.4–10.8)

## 2015-10-05 LAB — PSA: PROSTATE SPECIFIC AG, SERUM: 10.2 ng/mL — AB (ref 0.0–4.0)

## 2015-10-05 LAB — T3, FREE: T3 FREE: 2.9 pg/mL (ref 2.0–4.4)

## 2015-10-05 LAB — T4, FREE: FREE T4: 1.1 ng/dL (ref 0.82–1.77)

## 2015-10-05 LAB — TSH: TSH: 1.74 u[IU]/mL (ref 0.450–4.500)

## 2015-10-05 LAB — TESTOSTERONE,FREE AND TOTAL
Testosterone, Free: 7.9 pg/mL (ref 6.6–18.1)
Testosterone: 447 ng/dL (ref 348–1197)

## 2015-10-05 LAB — VITAMIN D 25 HYDROXY (VIT D DEFICIENCY, FRACTURES): VIT D 25 HYDROXY: 49.1 ng/mL (ref 30.0–100.0)

## 2015-10-05 LAB — COMMENT

## 2015-10-05 LAB — T3: T3, Total: 89 ng/dL (ref 71–180)

## 2015-10-18 ENCOUNTER — Telehealth: Payer: Self-pay

## 2015-10-18 DIAGNOSIS — E782 Mixed hyperlipidemia: Secondary | ICD-10-CM

## 2015-10-18 MED ORDER — EZETIMIBE 10 MG PO TABS: 10.0000 mg | ORAL_TABLET | Freq: Every day | ORAL | Status: DC

## 2015-10-18 NOTE — Telephone Encounter (Signed)
Advised patient as below. Sent medication into pharmacy. Patient will call in 3 mos to schedule F/U appt.

## 2015-10-18 NOTE — Telephone Encounter (Signed)
-----   Message from Birdie Sons, MD sent at 10/18/2015  8:10 AM EST ----- Testosterone level is normal. PSA is stable at 10.3. Cholesterol is high at 239. Recommend a trial of Zetia 10mg  daily, #30, rf x 3. And follow up in 3 months. All other labs normal.

## 2015-10-25 DIAGNOSIS — R300 Dysuria: Secondary | ICD-10-CM | POA: Diagnosis not present

## 2015-10-25 DIAGNOSIS — N390 Urinary tract infection, site not specified: Secondary | ICD-10-CM | POA: Diagnosis not present

## 2015-11-02 ENCOUNTER — Other Ambulatory Visit: Payer: Self-pay | Admitting: Family Medicine

## 2016-01-04 DIAGNOSIS — L821 Other seborrheic keratosis: Secondary | ICD-10-CM | POA: Diagnosis not present

## 2016-01-04 DIAGNOSIS — L570 Actinic keratosis: Secondary | ICD-10-CM | POA: Diagnosis not present

## 2016-01-04 DIAGNOSIS — L812 Freckles: Secondary | ICD-10-CM | POA: Diagnosis not present

## 2016-03-19 DIAGNOSIS — H353131 Nonexudative age-related macular degeneration, bilateral, early dry stage: Secondary | ICD-10-CM | POA: Diagnosis not present

## 2016-03-26 ENCOUNTER — Encounter: Payer: Self-pay | Admitting: Gastroenterology

## 2016-04-13 ENCOUNTER — Ambulatory Visit (INDEPENDENT_AMBULATORY_CARE_PROVIDER_SITE_OTHER): Payer: Medicare Other | Admitting: Family Medicine

## 2016-04-13 ENCOUNTER — Encounter: Payer: Self-pay | Admitting: Family Medicine

## 2016-04-13 VITALS — BP 120/60 | HR 58 | Temp 98.1°F | Resp 16 | Wt 191.0 lb

## 2016-04-13 DIAGNOSIS — A938 Other specified arthropod-borne viral fevers: Secondary | ICD-10-CM | POA: Diagnosis not present

## 2016-04-13 DIAGNOSIS — M2550 Pain in unspecified joint: Secondary | ICD-10-CM

## 2016-04-13 MED ORDER — AMOXICILLIN 500 MG PO CAPS: 1000.0000 mg | ORAL_CAPSULE | Freq: Two times a day (BID) | ORAL | Status: AC

## 2016-04-13 NOTE — Progress Notes (Signed)
Patient: Larry Crawford Male    DOB: 1941/01/17   75 y.o.   MRN: [id number] Visit Date: 04/13/2016  Today's Provider: Lelon Huh, MD   Chief Complaint  Patient presents with  . Insect Bite    Tick bite  . Headache   Subjective:    HPI Tick Bite: Patient comes in today reporting he has been bitten twice by ticks in the past 2 weeks. The first tick bite was on 04/02/2016 and the second was on 04/08/2016. Patient has started developing headaches, dizziness and joint pain this week. Patient was bitten behind his left leg and between his legs. Due to the location, patient is unable to see if there is any redness. Patient does have mild sorness and itching in the locations where he was bitten.  One was a deer tick present for about a day, the other was larger and attached for a few days. Some sweats. No rash. Headache are bitemporal and posterior. Mild to moderate in intensity.No other associated neurological symptoms.      Allergies  Allergen Reactions  . Tetracyclines & Related Shortness Of Breath and Rash  . Colestipol Hcl     Other reaction(s): Diarrhea Fatigue  . Pravastatin Sodium     Other reaction(s): Muscle Pain  . Shellfish Allergy   . Sulfa Antibiotics Hives   Previous Medications   ACETYLCYSTEINE 600 MG CAPS    Take 1 capsule by mouth daily.   ALLOPURINOL (ZYLOPRIM) 300 MG TABLET    Take 300 mg by mouth daily.     ARGININE 500 MG TABLET    Take 1 tablet by mouth 2 (two) times daily.   CETIRIZINE (ZYRTEC) 10 MG TABLET    Take 10 mg by mouth 2 (two) times a week. Takes 2-3 times a week    CHOLECALCIFEROL (VITAMIN D) 1000 UNITS CAPSULE    Take 1,000 Units by mouth 2 (two) times daily.     CIALIS 5 MG TABLET    TAKE 1 TABLET DAILY   CYCLOSPORINE (RESTASIS) 0.05 % OPHTHALMIC EMULSION    Place 2 drops into both eyes 2 (two) times daily.   EZETIMIBE (ZETIA) 10 MG TABLET    Take 1 tablet (10 mg total) by mouth daily.   FISH OIL-OMEGA-3 FATTY ACIDS 1000 MG CAPSULE     Take 1 g by mouth 2 (two) times daily.     INOSITOL PO    Take 600 mg by mouth daily.     KELP 225 MCG TABS    Take 1 tablet by mouth 2 (two) times a week. Takes 2-3 times a week    LUTEIN 20 MG CAPS    Take 1 capsule by mouth daily.     MULTIPLE VITAMINS-MINERALS (MULTIVITAMIN WITH MINERALS) TABLET    Take 1 tablet by mouth daily.     NAPROXEN (NAPROSYN) 500 MG TABLET    Take 1 tablet (500 mg total) by mouth 2 (two) times daily with a meal.   NIACIN 500 MG TABLET    Take 500 mg by mouth every other day.     POMEGRANATE-CITRUS BIOFLAV PO    Take 2 capsules by mouth daily.   POTASSIUM CITRATE PO    Take 99 mg by mouth once a week. Takes 1-2 a week    PROBIOTIC CAPS    Take 1 capsule by mouth daily.   VITAMIN B-12 (CYANOCOBALAMIN) 250 MCG TABLET    Take 1 tablet by mouth daily.  WHEY PROTEIN POWD    Take 4 g by mouth daily.    Review of Systems  Constitutional: Positive for fatigue. Negative for fever, chills, diaphoresis and appetite change.  Respiratory: Negative for chest tightness, shortness of breath and wheezing.   Cardiovascular: Negative for chest pain and palpitations.  Gastrointestinal: Negative for nausea, vomiting and abdominal pain.  Musculoskeletal: Positive for arthralgias.  Neurological: Positive for dizziness and light-headedness.    Social History  Substance Use Topics  . Smoking status: Former Smoker -- 0.75 packs/day for 5 years    Types: Cigarettes    Quit date: 11/19/1958  . Smokeless tobacco: Never Used  . Alcohol Use: 0.0 oz/week    0 Standard drinks or equivalent per week     Comment: 1 wine drink a month   Objective:   BP 120/60 mmHg  Pulse 58  Temp(Src) 98.1 F (36.7 C) (Oral)  Resp 16  Wt 191 lb (86.637 kg)  SpO2 96%  Physical Exam   General Appearance:    Alert, cooperative, no distress  Eyes:    PERRL, conjunctiva/corneas clear, EOM's intact       Lungs:     Clear to auscultation bilaterally, respirations unlabored  Heart:    Regular rate  and rhythm  Neurologic:   Awake, alert, oriented x 3. No apparent focal neurological           defect.   MS:   No joint erythema or swelling. Slight tenderness along joint lines of knees, elbows ans wrist.   Skin:   Small punctures consistent with tick bites, no surrounding erythema or rashes.        Assessment & Plan:     1. Arthralgia   2. Tick fever  - amoxicillin (AMOXIL) 500 MG capsule; Take 2 capsules (1,000 mg total) by mouth 2 (two) times daily.  Dispense: 28 capsule; Refill: 0  Call if symptoms change or if not rapidly improving.          Lelon Huh, MD  [facility]

## 2016-05-02 ENCOUNTER — Other Ambulatory Visit: Payer: Self-pay | Admitting: Family Medicine

## 2016-05-29 ENCOUNTER — Encounter: Payer: Self-pay | Admitting: Gastroenterology

## 2016-07-13 ENCOUNTER — Ambulatory Visit (AMBULATORY_SURGERY_CENTER): Payer: Self-pay

## 2016-07-13 VITALS — Ht 67.0 in | Wt 196.2 lb

## 2016-07-13 DIAGNOSIS — Z860100 Personal history of colon polyps, unspecified: Secondary | ICD-10-CM

## 2016-07-13 MED ORDER — SUPREP BOWEL PREP KIT 17.5-3.13-1.6 GM/177ML PO SOLN: 1.0000 | Freq: Once | ORAL | 0 refills | Status: AC

## 2016-07-13 NOTE — Progress Notes (Signed)
No allergies to eggs or soy No past problems with anesthesia No home oxygen No diet meds  Declined emmi 

## 2016-07-16 ENCOUNTER — Encounter: Payer: Self-pay | Admitting: Family Medicine

## 2016-07-16 ENCOUNTER — Ambulatory Visit (INDEPENDENT_AMBULATORY_CARE_PROVIDER_SITE_OTHER): Payer: Medicare Other | Admitting: Family Medicine

## 2016-07-16 VITALS — BP 120/68 | HR 64 | Temp 98.2°F | Resp 16 | Wt 196.0 lb

## 2016-07-16 DIAGNOSIS — M170 Bilateral primary osteoarthritis of knee: Secondary | ICD-10-CM

## 2016-07-16 DIAGNOSIS — M546 Pain in thoracic spine: Secondary | ICD-10-CM

## 2016-07-16 DIAGNOSIS — M1990 Unspecified osteoarthritis, unspecified site: Secondary | ICD-10-CM | POA: Insufficient documentation

## 2016-07-16 DIAGNOSIS — M7552 Bursitis of left shoulder: Secondary | ICD-10-CM

## 2016-07-16 DIAGNOSIS — M129 Arthropathy, unspecified: Secondary | ICD-10-CM | POA: Diagnosis not present

## 2016-07-16 MED ORDER — CARISOPRODOL 350 MG PO TABS: 350.0000 mg | ORAL_TABLET | Freq: Four times a day (QID) | ORAL | 0 refills | Status: DC | PRN

## 2016-07-16 MED ORDER — HYDROCODONE-ACETAMINOPHEN 5-325 MG PO TABS: 1.0000 | ORAL_TABLET | Freq: Four times a day (QID) | ORAL | 0 refills | Status: DC | PRN

## 2016-07-16 NOTE — Progress Notes (Signed)
Patient: Larry Crawford Male    DOB: [birth year]   75 y.o.   MRN: [id number] Visit Date: 07/16/2016  Today's Provider: Lelon Huh, MD   Chief Complaint  Patient presents with  . Knee Pain    Right worse than left  . Back Pain   Subjective:    Knee Pain   Incident onset: Bilateral knee pain that has been going on for several years.   There was no injury mechanism. The pain is present in the right knee and left knee. The quality of the pain is described as aching. The pain has been worsening since onset. The symptoms are aggravated by movement and weight bearing (Climbing stairs ). He has tried NSAIDs for the symptoms. The treatment provided mild relief.  Back Pain  This is a chronic problem. The current episode started more than 1 year ago. The problem has been gradually worsening (Pt has had to do a lot of physical work recently. ) since onset. The pain is present in the thoracic spine (Mostly on the right side. ). The quality of the pain is described as cramping. The pain does not radiate.   Has a long history of OA previously followed by Dr. Mack Guise who he  No longer sees. Has had steroid injections in knees with minimal improvement. He generally tolerates pain, but is going on 2 week trip to [place] starting on 07-20-2016 and is concerned pain is going to slow him down. He has take Soma for back and hydrocodone/apap in the past which he tolerated well and was effective.     Allergies  Allergen Reactions  . Tetracyclines & Related Shortness Of Breath and Rash  . Colestipol Hcl     Other reaction(s): Diarrhea Fatigue  . Corn-Containing Products     Diarrhea and "makes my insides hurt"   . Pravastatin Sodium     Other reaction(s): Muscle Pain  . Shellfish Allergy   . Sulfa Antibiotics Hives   Current Meds  Medication Sig  . allopurinol (ZYLOPRIM) 300 MG tablet TAKE 1 TABLET DAILY  . arginine 500 MG tablet Take 1 tablet by mouth 2 (two) times daily.  . cetirizine  (ZYRTEC) 10 MG tablet Take 10 mg by mouth 2 (two) times a week. Takes 2-3 times a week   . Cholecalciferol (VITAMIN D) 1000 UNITS capsule Take 1,000 Units by mouth 2 (two) times daily.    Marland Kitchen CIALIS 5 MG tablet TAKE 1 TABLET DAILY  . cycloSPORINE (RESTASIS) 0.05 % ophthalmic emulsion Place 2 drops into both eyes 2 (two) times daily.  Marland Kitchen GINSENG EXTRACT PO Take by mouth.  . INOSITOL PO Take 600 mg by mouth daily.    Marland Kitchen Kelp 225 MCG TABS Take 1 tablet by mouth 2 (two) times a week. Takes 2-3 times a week   . Lutein 20 MG CAPS Take 1 capsule by mouth daily.    . Multiple Vitamins-Minerals (MULTIVITAMIN WITH MINERALS) tablet Take 1 tablet by mouth daily.    . niacin 500 MG tablet Take 500 mg by mouth every other day.    Marland Kitchen POMEGRANATE-CITRUS BIOFLAV PO Take 2 capsules by mouth daily.  Marland Kitchen POTASSIUM CITRATE PO Take 99 mg by mouth once a week. Takes 1-2 a week   . Probiotic CAPS Take 1 capsule by mouth daily.  . vitamin B-12 (CYANOCOBALAMIN) 250 MCG tablet Take 1 tablet by mouth daily.  . Whey Protein POWD Take 4 g by mouth daily.   Current  Facility-Administered Medications for the 07/16/16 encounter (Office Visit) with Birdie Sons, MD  Medication  . 0.9 %  sodium chloride infusion    Review of Systems  Constitutional: Negative.   Respiratory: Negative.   Cardiovascular: Negative.   Musculoskeletal: Positive for arthralgias and back pain. Negative for gait problem, joint swelling, myalgias, neck pain and neck stiffness.    Social History  Substance Use Topics  . Smoking status: Former Smoker    Packs/day: 0.75    Years: 5.00    Types: Cigarettes    Quit date: 11/19/1958  . Smokeless tobacco: Never Used  . Alcohol use 0.0 oz/week     Comment: 1 wine drink a month   Objective:   BP 120/68 (BP Location: Left Arm, Patient Position: Sitting, Cuff Size: Normal)   Pulse 64   Temp 98.2 F (36.8 C) (Oral)   Resp 16   Wt 196 lb (88.9 kg)   BMI 30.70 kg/m   Physical Exam   General  Appearance:    Alert, cooperative, no distress  Eyes:    PERRL, conjunctiva/corneas clear, EOM's intact       Lungs:     Clear to auscultation bilaterally, respirations unlabored  Heart:    Regular rate and rhythm  Neurologic:   Awake, alert, oriented x 3. No apparent focal neurological           defect.           Assessment & Plan:     1. Bursitis of left shoulder  - HYDROcodone-acetaminophen (NORCO/VICODIN) 5-325 MG tablet; Take 1 tablet by mouth every 6 (six) hours as needed for moderate pain.  Dispense: 60 tablet; Refill: 0  2. Arthritis of both knees  - HYDROcodone-acetaminophen (NORCO/VICODIN) 5-325 MG tablet; Take 1 tablet by mouth every 6 (six) hours as needed for moderate pain.  Dispense: 60 tablet; Refill: 0  3. Right-sided thoracic back pain  - carisoprodol (SOMA) 350 MG tablet; Take 1 tablet (350 mg total) by mouth 4 (four) times daily as needed for muscle spasms.  Dispense: 60 tablet; Refill: 0        Lelon Huh, MD  [facility]

## 2016-08-01 ENCOUNTER — Telehealth: Payer: Self-pay | Admitting: Gastroenterology

## 2016-08-01 NOTE — Telephone Encounter (Signed)
No charge this time. 

## 2016-08-01 NOTE — Telephone Encounter (Signed)
Do you want to charge? 

## 2016-08-03 ENCOUNTER — Encounter: Payer: Medicare Other | Admitting: Gastroenterology

## 2016-10-15 DIAGNOSIS — L570 Actinic keratosis: Secondary | ICD-10-CM | POA: Diagnosis not present

## 2016-10-31 ENCOUNTER — Telehealth: Payer: Self-pay | Admitting: Family Medicine

## 2016-10-31 NOTE — Telephone Encounter (Signed)
Called Pt to schedule AWV with [facility] - knb °

## 2016-10-31 NOTE — Telephone Encounter (Signed)
Pt returned call and is scheduled for AWV with [facility] on 11/21/15 @ 3 pm. Thanks TNP

## 2016-11-20 ENCOUNTER — Ambulatory Visit (INDEPENDENT_AMBULATORY_CARE_PROVIDER_SITE_OTHER): Payer: Medicare Other

## 2016-11-20 VITALS — BP 131/62 | HR 72 | Temp 98.4°F | Ht 67.0 in | Wt 201.2 lb

## 2016-11-20 DIAGNOSIS — Z0000 Encounter for general adult medical examination without abnormal findings: Secondary | ICD-10-CM | POA: Diagnosis not present

## 2016-11-20 NOTE — Progress Notes (Signed)
Subjective:   Larry Crawford is a 76 y.o. male who presents for Medicare Annual/Subsequent preventive examination.  Review of Systems:  N/A  Cardiac Risk Factors include: advanced age (>33men, >104 women);hypertension;male gender;obesity (BMI >30kg/m2)     Objective:    Vitals: BP 131/62 (BP Location: Right Arm)   Pulse 72   Temp 98.4 F (36.9 C) (Oral)   Ht 5\' 7"  (1.702 m)   Wt 201 lb 4 oz (91.3 kg)   BMI 31.52 kg/m   Body mass index is 31.52 kg/m.  Tobacco History  Smoking Status  . Former Smoker  . Packs/day: 0.75  . Years: 5.00  . Types: Cigarettes  . Quit date: 11/19/1958  Smokeless Tobacco  . Never Used     Counseling given: Not Answered   Past Medical History:  Diagnosis Date  . BPH (benign prostatic hypertrophy)   . Colon polyp   . Elevated red blood cell count   . Gout   . History of chicken pox   . History of measles   . History of mumps   . IBS (irritable bowel syndrome)    Past Surgical History:  Procedure Laterality Date  . aortic ultrasound  2013   normal. Screening ultrasound at [facility] per pt report  . 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 BUNIONECTOMY     left  . CHOLECYSTECTOMY  1997  . COLONOSCOPY    . INGUINAL HERNIA REPAIR  [phone]   2 on right/1 on left  . LIPOMA EXCISION  2013  . NOSE SURGERY     divided septum  . POLYPECTOMY    . [place]  . SHOULDER ARTHROSCOPY  1984 and 1987   left X2 for spurs  . TONSILLECTOMY AND ADENOIDECTOMY  2002  . UMBILICAL HERNIA REPAIR     Family History  Problem Relation Age of Onset  . Hypothyroidism Mother    History  Sexual Activity  . Sexual activity: Not on file    Outpatient Encounter Prescriptions as of 11/20/2016  Medication Sig  . allopurinol (ZYLOPRIM) 300 MG tablet TAKE 1 TABLET DAILY  . arginine 500 MG tablet Take 1 tablet by mouth 2 (two) times daily.  . carisoprodol (SOMA) 350 MG tablet Take 1 tablet (350 mg total) by mouth 4 (four) times daily as needed for muscle spasms.    . cetirizine (ZYRTEC) 10 MG tablet Take 10 mg by mouth 2 (two) times a week. Takes 2-3 times a week   . Cholecalciferol (VITAMIN D) 1000 UNITS capsule Take 1,000 Units by mouth 2 (two) times daily.    Marland Kitchen CIALIS 5 MG tablet TAKE 1 TABLET DAILY  . cycloSPORINE (RESTASIS) 0.05 % ophthalmic emulsion Place 2 drops into both eyes 2 (two) times daily.  Marland Kitchen HYDROcodone-acetaminophen (NORCO/VICODIN) 5-325 MG tablet Take 1 tablet by mouth every 6 (six) hours as needed for moderate pain.  Marland Kitchen Kelp 225 MCG TABS Take 1 tablet by mouth 2 (two) times a week. Takes 2-3 times a week   . Lutein 20 MG CAPS Take 1 capsule by mouth daily.    . Multiple Vitamins-Minerals (MULTIVITAMIN WITH MINERALS) tablet Take 1 tablet by mouth daily.    . naproxen (NAPROSYN) 500 MG tablet Take 1 tablet (500 mg total) by mouth 2 (two) times daily with a meal. (Patient taking differently: Take 500 mg by mouth. )  . niacin 500 MG tablet Take 500 mg by mouth every other day.    Marland Kitchen  POMEGRANATE-CITRUS BIOFLAV PO Take 2 capsules by mouth daily.  Marland Kitchen POTASSIUM CITRATE PO Take 99 mg by mouth once a week. Takes 1-2 a week   . Probiotic CAPS Take 1 capsule by mouth daily.  . vitamin B-12 (CYANOCOBALAMIN) 250 MCG tablet Take 1 tablet by mouth daily.  . Whey Protein POWD Take 4 g by mouth daily.   . Acetylcysteine 600 MG CAPS Take 1 capsule by mouth daily.  . fish oil-omega-3 fatty acids 1000 MG capsule Take 1 g by mouth 2 (two) times daily.    Marland Kitchen GINSENG EXTRACT PO Take by mouth.  . INOSITOL PO Take 600 mg by mouth daily.     Facility-Administered Encounter Medications as of 11/20/2016  Medication  . 0.9 %  sodium chloride infusion    Activities of Daily Living In your present state of health, do you have any difficulty performing the following activities: 11/20/2016  Hearing? Y  Vision? N  Difficulty concentrating or making decisions? N  Walking or climbing stairs? N  Dressing or bathing? N  Doing errands, shopping? N  Preparing Food and  eating ? N  Using the Toilet? N  In the past six months, have you accidently leaked urine? N  Do you have problems with loss of bowel control? N  Managing your Medications? N  Managing your Finances? N  Housekeeping or managing your Housekeeping? N  Some recent data might be hidden    Patient Care Team: Birdie Sons, MD as PCP - General (Family Medicine) Crista Curb. Gloriann Loan, MD as Consulting Physician (Ophthalmology)   Assessment:     Exercise Activities and Dietary recommendations Current Exercise Habits: The patient does not participate in regular exercise at present, Exercise limited by: None identified  Goals    . Exercise          Starting in March 2018, I will start exercising/walking 3 times a week for 30 minutes.       Fall Risk Fall Risk  11/20/2016 10/04/2015  Falls in the past year? No No   Depression Screen PHQ 2/9 Scores 11/20/2016 10/04/2015  PHQ - 2 Score 0 4  PHQ- 9 Score - 9    Cognitive Function     6CIT Screen 11/20/2016  What Year? 0 points  What month? 0 points  What time? 0 points  Count back from 20 0 points  Months in reverse 0 points  Repeat phrase 2 points  Total Score 2    Immunization History  Administered Date(s) Administered  . Influenza, High Dose Seasonal PF 09/28/2015  . Pneumococcal Conjugate-13 05/11/2014  . Pneumococcal Polysaccharide-23 11/19/2004  . Tdap 08/25/2014  . Zoster 02/05/2013   Screening Tests Health Maintenance  Topic Date Due  . PNA vac Low Risk Adult (2 of 2 - PPSV23) 12/20/2016 (Originally 05/12/2015)  . COLONOSCOPY  11/20/2017 (Originally 06/04/2016)  . TETANUS/TDAP  08/25/2024  . INFLUENZA VACCINE  Completed  . ZOSTAVAX  Completed      Plan:  I have personally reviewed and addressed the Medicare Annual Wellness questionnaire and have noted the following in the patient's chart:  A. Medical and social history B. Use of alcohol, tobacco or illicit drugs  C. Current medications and  supplements D. Functional ability and status E.  Nutritional status F.  Physical activity G. Advance directives H. List of other physicians I.  Hospitalizations, surgeries, and ER visits in previous 12 months J.  [place] such as hearing and vision if needed, cognitive and depression  L. Referrals and appointments - none  In addition, I have reviewed and discussed with patient certain preventive protocols, quality metrics, and best practice recommendations. A written personalized care plan for preventive services as well as general preventive health recommendations were provided to patient.  See attached scanned questionnaire for additional information.   Signed,  Fabio Neighbors, LPN Nurse Health Advisor   MD Recommendations: Follow up on colonoscopy and Pneumovax 23. Pt declined today.   I have reviewed the health advisor's note, was available for consultation, and agree with documentation and plan  Lelon Huh, MD

## 2016-11-20 NOTE — Patient Instructions (Signed)
Larry Crawford , Thank you for taking time to come for your Medicare Wellness Visit. I appreciate your ongoing commitment to your health goals. Please review the following plan we discussed and let me know if I can assist you in the future.   These are the goals we discussed: Goals    . Exercise          Starting in March 2018, I will start exercising/walking 3 times a week for 30 minutes.        This is a list of the screening recommended for you and due dates:  Health Maintenance  Topic Date Due  . Pneumonia vaccines (2 of 2 - PPSV23) 05/12/2015  . Colon Cancer Screening  06/04/2016  . Flu Shot  06/19/2016  . Tetanus Vaccine  08/25/2024  . Shingles Vaccine  Completed   Preventive Care for Adults  A healthy lifestyle and preventive care can promote health and wellness. Preventive health guidelines for adults include the following key practices.  . A routine yearly physical is a good way to check with your health care provider about your health and preventive screening. It is a chance to share any concerns and updates on your health and to receive a thorough exam.  . Visit your dentist for a routine exam and preventive care every 6 months. Brush your teeth twice a day and floss once a day. Good oral hygiene prevents tooth decay and gum disease.  . The frequency of eye exams is based on your age, health, family medical history, use  of contact lenses, and other factors. Follow your health care provider's ecommendations for frequency of eye exams.  . Eat a healthy diet. Foods like vegetables, fruits, whole grains, low-fat dairy products, and lean protein foods contain the nutrients you need without too many calories. Decrease your intake of foods high in solid fats, added sugars, and salt. Eat the right amount of calories for you. Get information about a proper diet from your health care provider, if necessary.  . Regular physical exercise is one of the most important things you can do  for your health. Most adults should get at least 150 minutes of moderate-intensity exercise (any activity that increases your heart rate and causes you to sweat) each week. In addition, most adults need muscle-strengthening exercises on 2 or more days a week.  Silver Sneakers may be a benefit available to you. To determine eligibility, you may visit the website: www.silversneakers.com or contact program at 475-136-5346 Mon-Fri between 8AM-8PM.   . Maintain a healthy weight. The body mass index (BMI) is a screening tool to identify possible weight problems. It provides an estimate of body fat based on height and weight. Your health care provider can find your BMI and can help you achieve or maintain a healthy weight.   For adults 20 years and older: ? A BMI below 18.5 is considered underweight. ? A BMI of 18.5 to 24.9 is normal. ? A BMI of 25 to 29.9 is considered overweight. ? A BMI of 30 and above is considered obese.   . Maintain normal blood lipids and cholesterol levels by exercising and minimizing your intake of saturated fat. Eat a balanced diet with plenty of fruit and vegetables. Blood tests for lipids and cholesterol should begin at age 35 and be repeated every 5 years. If your lipid or cholesterol levels are high, you are over 50, or you are at high risk for heart disease, you may need your cholesterol levels checked  more frequently. Ongoing high lipid and cholesterol levels should be treated with medicines if diet and exercise are not working.  . If you smoke, find out from your health care provider how to quit. If you do not use tobacco, please do not start.  . If you choose to drink alcohol, please do not consume more than 2 drinks per day. One drink is considered to be 12 ounces (355 mL) of beer, 5 ounces (148 mL) of wine, or 1.5 ounces (44 mL) of liquor.  . If you are 64-66 years old, ask your health care provider if you should take aspirin to prevent strokes.  . Use sunscreen.  Apply sunscreen liberally and repeatedly throughout the day. You should seek shade when your shadow is shorter than you. Protect yourself by wearing long sleeves, pants, a wide-brimmed hat, and sunglasses year round, whenever you are outdoors.  . Once a month, do a whole body skin exam, using a mirror to look at the skin on your back. Tell your health care provider of new moles, moles that have irregular borders, moles that are larger than a pencil eraser, or moles that have changed in shape or color.

## 2016-12-11 ENCOUNTER — Encounter: Payer: Self-pay | Admitting: Family Medicine

## 2016-12-11 ENCOUNTER — Ambulatory Visit (INDEPENDENT_AMBULATORY_CARE_PROVIDER_SITE_OTHER): Payer: Medicare Other | Admitting: Family Medicine

## 2016-12-11 VITALS — BP 118/72 | HR 92 | Temp 98.1°F | Resp 16 | Wt 201.0 lb

## 2016-12-11 DIAGNOSIS — N529 Male erectile dysfunction, unspecified: Secondary | ICD-10-CM

## 2016-12-11 DIAGNOSIS — E559 Vitamin D deficiency, unspecified: Secondary | ICD-10-CM | POA: Diagnosis not present

## 2016-12-11 DIAGNOSIS — N400 Enlarged prostate without lower urinary tract symptoms: Secondary | ICD-10-CM

## 2016-12-11 DIAGNOSIS — E782 Mixed hyperlipidemia: Secondary | ICD-10-CM

## 2016-12-11 NOTE — Progress Notes (Signed)
Patient: Larry Crawford Male    DOB: [birth year]   76 y.o.   MRN: [id number] Visit Date: 12/11/2016  Today's Provider: Lelon Huh, MD   Chief Complaint  Patient presents with  . Back Pain    follow up  . Hypogonadism    follow up  . Arthritis    follow up  . Hyperlipidemia    follow up   Subjective:    HPI Follow up Thoracic back pain:  Patient was last seen for this problem 5 months ago and was prescribed as needed Soma for muscle spasms. Patient reports fair compliance with treatment, good tolerance and fair symptom control.    Follow up Hypogonadism:  Patient was last seen for this problem over 1 year ago and no changes were made. Patient reports good compliance with treatment.    Follow up Arthritis:  Patient was last seen for this problem 5 months ago and was prescribed Hydrocodone- Acetaminophen to take as needed.Patient has been out of medication for a few weeks, but reports it was helping to relieve pain. Patient is interested in taking something other than Hydrocodone-Acetaminophen.    Lipid/Cholesterol, Follow-up:   Last seen for this 1.5 years ago.  Management changes since that visit include starting samples of Zetia. . Last Lipid Panel:    Component Value Date/Time   CHOL 239 (H) 10/04/2015 1014   TRIG 221 (H) 10/04/2015 1014   HDL 42 10/04/2015 1014   CHOLHDL 5.7 (H) 10/04/2015 1014   LDLCALC 153 (H) 10/04/2015 1014    Risk factors for vascular disease include hypercholesterolemia  He reports poor compliance with treatment. Patient only took medication for a few weeks, then ran out of medication. He is not having side effects.  Current symptoms include none and have been stable. Weight trend: stable Prior visit with dietician: no Current diet: in general, a "healthy" diet   Current exercise: none  Wt Readings from Last 3 Encounters:  11/20/16 201 lb 4 oz (91.3 kg)  07/16/16 196 lb (88.9 kg)  07/13/16 196 lb 3.2 oz (89 kg)     ------------------------------------------------------------------- Follow up of Vitamin D Deficiency:  Patient was last seen for this problem over 1 year ago and no changes were made. Patient reports good compliance with treatment.     Allergies  Allergen Reactions  . Tetracyclines & Related Shortness Of Breath and Rash  . Colestipol Hcl     Other reaction(s): Diarrhea Fatigue  . Corn-Containing Products     Diarrhea and "makes my insides hurt"   . Pravastatin Sodium     Other reaction(s): Muscle Pain  . Shellfish Allergy   . Sulfa Antibiotics Hives     Current Outpatient Prescriptions:  .  Acetylcysteine 600 MG CAPS, Take 1 capsule by mouth daily., Disp: , Rfl:  .  allopurinol (ZYLOPRIM) 300 MG tablet, TAKE 1 TABLET DAILY, Disp: 90 tablet, Rfl: 4 .  arginine 500 MG tablet, Take 1 tablet by mouth 2 (two) times daily., Disp: , Rfl:  .  carisoprodol (SOMA) 350 MG tablet, Take 1 tablet (350 mg total) by mouth 4 (four) times daily as needed for muscle spasms., Disp: 60 tablet, Rfl: 0 .  cetirizine (ZYRTEC) 10 MG tablet, Take 10 mg by mouth 2 (two) times a week. Takes 2-3 times a week , Disp: , Rfl:  .  Cholecalciferol (VITAMIN D3) 5000 units CAPS, Take 1 capsule by mouth daily., Disp: , Rfl:  .  CIALIS 5 MG  tablet, TAKE 1 TABLET DAILY, Disp: 90 tablet, Rfl: 3 .  cycloSPORINE (RESTASIS) 0.05 % ophthalmic emulsion, Place 2 drops into both eyes 2 (two) times daily., Disp: , Rfl:  .  GINSENG EXTRACT PO, Take by mouth., Disp: , Rfl:  .  HYDROcodone-acetaminophen (NORCO/VICODIN) 5-325 MG tablet, Take 1 tablet by mouth every 6 (six) hours as needed for moderate pain., Disp: 60 tablet, Rfl: 0 .  INOSITOL PO, Take 600 mg by mouth daily.  , Disp: , Rfl:  .  Kelp 225 MCG TABS, Take 1 tablet by mouth 2 (two) times a week. Takes 2-3 times a week , Disp: , Rfl:  .  Lutein 20 MG CAPS, Take 1 capsule by mouth daily.  , Disp: , Rfl:  .  Multiple Vitamins-Minerals (MULTIVITAMIN WITH MINERALS)  tablet, Take 1 tablet by mouth daily.  , Disp: , Rfl:  .  naproxen (NAPROSYN) 500 MG tablet, Take 1 tablet (500 mg total) by mouth 2 (two) times daily with a meal. (Patient taking differently: Take 500 mg by mouth. ), Disp: 30 tablet, Rfl: 0 .  niacin 500 MG tablet, Take 500 mg by mouth every other day.  , Disp: , Rfl:  .  POMEGRANATE-CITRUS BIOFLAV PO, Take 2 capsules by mouth daily., Disp: , Rfl:  .  POTASSIUM CITRATE PO, Take 99 mg by mouth once a week. Takes 1-2 a week , Disp: , Rfl:  .  Probiotic CAPS, Take 1 capsule by mouth daily., Disp: , Rfl:  .  vitamin B-12 (CYANOCOBALAMIN) 250 MCG tablet, Take 1 tablet by mouth daily., Disp: , Rfl:  .  Whey Protein POWD, Take 4 g by mouth daily. , Disp: , Rfl:   Current Facility-Administered Medications:  .  0.9 %  sodium chloride infusion, 500 mL, Intravenous, Continuous, Ladene Artist, MD  Review of Systems  Constitutional: Positive for fatigue. Negative for appetite change, chills and fever.  Respiratory: Positive for shortness of breath. Negative for chest tightness and wheezing.   Cardiovascular: Negative for chest pain and palpitations.  Gastrointestinal: Negative for abdominal pain, nausea and vomiting.  Musculoskeletal: Positive for arthralgias and back pain.  Skin: Positive for color change (on his scalp and chest).    Social History  Substance Use Topics  . Smoking status: Former Smoker    Packs/day: 0.75    Years: 5.00    Types: Cigarettes    Quit date: 11/19/1958  . Smokeless tobacco: Never Used  . Alcohol use 0.0 oz/week     Comment: 1 wine drink a month   Objective:   BP 118/72 (BP Location: Left Arm, Patient Position: Sitting, Cuff Size: Large)   Pulse 92   Temp 98.1 F (36.7 C)   Resp 16   Wt 201 lb (91.2 kg)   SpO2 98% Comment: room air  BMI 31.48 kg/m   Physical Exam   General Appearance:    Alert, cooperative, no distress  Eyes:    PERRL, conjunctiva/corneas clear, EOM's intact       Lungs:     Clear to  auscultation bilaterally, respirations unlabored  Heart:    Regular rate and rhythm  Neurologic:   Awake, alert, oriented x 3. No apparent focal neurological           defect.           Assessment & Plan:     1. Benign prostatic hyperplasia without lower urinary tract symptoms Well controlled on Cialis 5mg  daily - PSA  2. 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 Mixed hyperlipidemia Intolerant to multiple statins and Zetia. Intolerant to ASA due to upset stomach and excessive bruising.  - TSH - Lipid panel - Glucose  4. Vitamin D deficiency Doing well on OTC vitamin D supplements.  - VITAMIN D 25 Hydroxy (Vit-D Deficiency, Fractures)       Lelon Huh, MD  [facility]

## 2016-12-13 DIAGNOSIS — N400 Enlarged prostate without lower urinary tract symptoms: Secondary | ICD-10-CM | POA: Diagnosis not present

## 2016-12-13 DIAGNOSIS — E782 Mixed hyperlipidemia: Secondary | ICD-10-CM | POA: Diagnosis not present

## 2016-12-13 DIAGNOSIS — E559 Vitamin D deficiency, unspecified: Secondary | ICD-10-CM | POA: Diagnosis not present

## 2016-12-14 ENCOUNTER — Telehealth: Payer: Self-pay

## 2016-12-14 ENCOUNTER — Encounter: Payer: Self-pay | Admitting: Family Medicine

## 2016-12-14 DIAGNOSIS — R739 Hyperglycemia, unspecified: Secondary | ICD-10-CM | POA: Insufficient documentation

## 2016-12-14 LAB — TSH: TSH: 1.95 u[IU]/mL (ref 0.450–4.500)

## 2016-12-14 LAB — VITAMIN D 25 HYDROXY (VIT D DEFICIENCY, FRACTURES): Vit D, 25-Hydroxy: 47.6 ng/mL (ref 30.0–100.0)

## 2016-12-14 LAB — PSA: PROSTATE SPECIFIC AG, SERUM: 11.6 ng/mL — AB (ref 0.0–4.0)

## 2016-12-14 LAB — LIPID PANEL
Chol/HDL Ratio: 5.7 ratio units — ABNORMAL HIGH (ref 0.0–5.0)
Cholesterol, Total: 262 mg/dL — ABNORMAL HIGH (ref 100–199)
HDL: 46 mg/dL (ref >39)
LDL Calculated: 185 mg/dL — ABNORMAL HIGH (ref 0–99)
Triglycerides: 154 mg/dL — ABNORMAL HIGH (ref 0–149)
VLDL CHOLESTEROL CAL: 31 mg/dL (ref 5–40)

## 2016-12-14 LAB — GLUCOSE, RANDOM: GLUCOSE: 117 mg/dL — AB (ref 65–99)

## 2016-12-14 NOTE — Telephone Encounter (Signed)
-----   Message from Birdie Sons, MD sent at 12/14/2016 12:30 PM EST ----- PSA is up from 10.2 last year to 11.6 this year.  Cholesterol and blood sugar are also high. Try samples of Livalo 2mg  daily for cholesterol. One month samples. Check PSA, lipids, and hgba1c one month

## 2016-12-14 NOTE — Telephone Encounter (Signed)
Pt advised appointment made and samples left up front-aa

## 2016-12-14 NOTE — Telephone Encounter (Signed)
[id number]-aa 

## 2016-12-19 ENCOUNTER — Telehealth: Payer: Self-pay | Admitting: Family Medicine

## 2016-12-19 DIAGNOSIS — N529 Male erectile dysfunction, unspecified: Secondary | ICD-10-CM

## 2016-12-19 DIAGNOSIS — R739 Hyperglycemia, unspecified: Secondary | ICD-10-CM

## 2016-12-19 NOTE — Telephone Encounter (Signed)
Pt stated that he was given samples of Livalo 2 mg last week and since starting the medication he has been having back, shoulder, leg, hand , & feet pain. Pt wanted to let Dr. Caryn Section know he took the medication last night but no longer plans to take the medication. Pt request a call back to discuss this. Please advise. Thanks TNP

## 2016-12-20 NOTE — Telephone Encounter (Signed)
LMOVM for pt to return call 

## 2016-12-20 NOTE — Telephone Encounter (Signed)
Suggest he try taking one every OTHER day along with 200mg  Coenzyme Q10

## 2016-12-20 NOTE — Telephone Encounter (Signed)
Patient stated that he does not want to take any cholesterol medication.

## 2016-12-31 DIAGNOSIS — N410 Acute prostatitis: Secondary | ICD-10-CM | POA: Diagnosis not present

## 2016-12-31 DIAGNOSIS — R300 Dysuria: Secondary | ICD-10-CM | POA: Diagnosis not present

## 2016-12-31 DIAGNOSIS — N390 Urinary tract infection, site not specified: Secondary | ICD-10-CM | POA: Diagnosis not present

## 2017-01-07 DIAGNOSIS — D225 Melanocytic nevi of trunk: Secondary | ICD-10-CM | POA: Diagnosis not present

## 2017-01-07 DIAGNOSIS — L578 Other skin changes due to chronic exposure to nonionizing radiation: Secondary | ICD-10-CM | POA: Diagnosis not present

## 2017-01-07 DIAGNOSIS — L814 Other melanin hyperpigmentation: Secondary | ICD-10-CM | POA: Diagnosis not present

## 2017-01-07 DIAGNOSIS — L570 Actinic keratosis: Secondary | ICD-10-CM | POA: Diagnosis not present

## 2017-01-07 DIAGNOSIS — L821 Other seborrheic keratosis: Secondary | ICD-10-CM | POA: Diagnosis not present

## 2017-01-08 ENCOUNTER — Telehealth: Payer: Self-pay | Admitting: Family Medicine

## 2017-01-08 NOTE — Telephone Encounter (Signed)
Please advise patient is time to recheck PSA and average blood sugar (hgba1c) order is at front desk for him to pick up.

## 2017-01-08 NOTE — Telephone Encounter (Signed)
Advised  ED 

## 2017-01-08 NOTE — Telephone Encounter (Signed)
Please advise patient it is time to recheck elevated PSA and average blood sugar (hgba1c) has has been left at front desk for him to pick up. Does not need to be fasting.

## 2017-01-09 DIAGNOSIS — Z125 Encounter for screening for malignant neoplasm of prostate: Secondary | ICD-10-CM | POA: Diagnosis not present

## 2017-01-09 DIAGNOSIS — N529 Male erectile dysfunction, unspecified: Secondary | ICD-10-CM | POA: Diagnosis not present

## 2017-01-09 DIAGNOSIS — R739 Hyperglycemia, unspecified: Secondary | ICD-10-CM | POA: Diagnosis not present

## 2017-01-10 LAB — PSA: PROSTATE SPECIFIC AG, SERUM: 13.9 ng/mL — AB (ref 0.0–4.0)

## 2017-01-10 LAB — HEMOGLOBIN A1C
ESTIMATED AVERAGE GLUCOSE: 117
HEMOGLOBIN A1C: 5.7 % — AB (ref 4.8–5.6)

## 2017-01-15 ENCOUNTER — Ambulatory Visit (INDEPENDENT_AMBULATORY_CARE_PROVIDER_SITE_OTHER): Payer: Medicare Other | Admitting: Family Medicine

## 2017-01-15 ENCOUNTER — Encounter: Payer: Self-pay | Admitting: Family Medicine

## 2017-01-15 VITALS — BP 122/68 | HR 68 | Temp 98.7°F | Resp 16 | Ht 67.0 in | Wt 201.0 lb

## 2017-01-15 DIAGNOSIS — E782 Mixed hyperlipidemia: Secondary | ICD-10-CM

## 2017-01-15 NOTE — Progress Notes (Signed)
Patient: Larry Crawford Male    DOB: 1941-09-11   76 y.o.   MRN: [id number] Visit Date: 01/15/2017  Today's Provider: Lelon Huh, MD   Chief Complaint  Patient presents with  . Hyperlipidemia   Subjective:    HPI  Lipid/Cholesterol, Follow-up:   Last seen for this1 months ago.  Management changes since that visit include starting Livalo once daily. Patient reports that he only took the medication for about 10 days. He reports that it gave him severe muscle and joint pain, and he did not want to continue taking the medication. It was recommended to the patient that he take Livalo every other day instead, but the patient declined. He has also tried Zetia and colestid in the past which he did not tolerate. He had been taking metamucll every day until about 6 months ago.  . Lab Results  Component Value Date   CHOL 262 (H) 12/13/2016   CHOL 239 (H) 10/04/2015   CHOL 222 (A) 05/13/2014   Lab Results  Component Value Date   HDL 46 12/13/2016   HDL 42 10/04/2015   HDL 43 05/13/2014   Lab Results  Component Value Date   LDLCALC 185 (H) 12/13/2016   LDLCALC 153 (H) 10/04/2015   LDLCALC 149 05/13/2014   Lab Results  Component Value Date   TRIG 154 (H) 12/13/2016   TRIG 221 (H) 10/04/2015   TRIG 149 05/13/2014   Lab Results  Component Value Date   CHOLHDL 5.7 (H) 12/13/2016   CHOLHDL 5.7 (H) 10/04/2015   No results found for: LDLDIRECT       Allergies  Allergen Reactions  . Tetracyclines & Related Shortness Of Breath and Rash  . Colestipol Hcl     Other reaction(s): Diarrhea Fatigue  . Corn-Containing Products     Diarrhea and "makes my insides hurt"   . Pravastatin Sodium     Other reaction(s): Muscle Pain  . Shellfish Allergy   . Sulfa Antibiotics Hives  . Zetia [Ezetimibe]     Didn't feel good when he was taking it.      Current Outpatient Prescriptions:  .  Acetylcysteine 600 MG CAPS, Take 1 capsule by mouth daily., Disp: , Rfl:  .   allopurinol (ZYLOPRIM) 300 MG tablet, TAKE 1 TABLET DAILY, Disp: 90 tablet, Rfl: 4 .  arginine 500 MG tablet, Take 1 tablet by mouth 2 (two) times daily., Disp: , Rfl:  .  carisoprodol (SOMA) 350 MG tablet, Take 1 tablet (350 mg total) by mouth 4 (four) times daily as needed for muscle spasms., Disp: 60 tablet, Rfl: 0 .  cetirizine (ZYRTEC) 10 MG tablet, Take 10 mg by mouth 2 (two) times a week. Takes 2-3 times a week , Disp: , Rfl:  .  Cholecalciferol (VITAMIN D3) 5000 units CAPS, Take 1 capsule by mouth daily., Disp: , Rfl:  .  CIALIS 5 MG tablet, TAKE 1 TABLET DAILY, Disp: 90 tablet, Rfl: 3 .  cycloSPORINE (RESTASIS) 0.05 % ophthalmic emulsion, Place 2 drops into both eyes 2 (two) times daily., Disp: , Rfl:  .  GINSENG EXTRACT PO, Take by mouth., Disp: , Rfl:  .  HYDROcodone-acetaminophen (NORCO/VICODIN) 5-325 MG tablet, Take 1 tablet by mouth every 6 (six) hours as needed for moderate pain., Disp: 60 tablet, Rfl: 0 .  INOSITOL PO, Take 600 mg by mouth daily.  , Disp: , Rfl:  .  Kelp 225 MCG TABS, Take 1 tablet by mouth 2 (two)  times a week. Takes 2-3 times a week , Disp: , Rfl:  .  Lutein 20 MG CAPS, Take 1 capsule by mouth daily.  , Disp: , Rfl:  .  Multiple Vitamins-Minerals (MULTIVITAMIN WITH MINERALS) tablet, Take 1 tablet by mouth daily.  , Disp: , Rfl:  .  naproxen (NAPROSYN) 500 MG tablet, Take 1 tablet (500 mg total) by mouth 2 (two) times daily with a meal. (Patient taking differently: Take 500 mg by mouth. ), Disp: 30 tablet, Rfl: 0 .  niacin 500 MG tablet, Take 500 mg by mouth every other day.  , Disp: , Rfl:  .  POMEGRANATE-CITRUS BIOFLAV PO, Take 2 capsules by mouth daily., Disp: , Rfl:  .  POTASSIUM CITRATE PO, Take 99 mg by mouth once a week. Takes 1-2 a week , Disp: , Rfl:  .  Probiotic CAPS, Take 1 capsule by mouth daily., Disp: , Rfl:  .  vitamin B-12 (CYANOCOBALAMIN) 250 MCG tablet, Take 1 tablet by mouth daily., Disp: , Rfl:  .  Whey Protein POWD, Take 4 g by mouth daily. ,  Disp: , Rfl:   Current Facility-Administered Medications:  .  0.9 %  sodium chloride infusion, 500 mL, Intravenous, Continuous, Ladene Artist, MD  Review of Systems  Constitutional: Negative.   Respiratory: Negative.   Cardiovascular: Negative.   Musculoskeletal: Negative.   Neurological: Negative.     Social History  Substance Use Topics  . Smoking status: Former Smoker    Packs/day: 0.75    Years: 5.00    Types: Cigarettes    Quit date: 11/19/1958  . Smokeless tobacco: Never Used  . Alcohol use 0.0 oz/week     Comment: 1 wine drink a month   Objective:   BP 122/68 (BP Location: Right Arm, Patient Position: Sitting, Cuff Size: Normal)   Pulse 68   Temp 98.7 F (37.1 C)   Resp 16   Ht 5\' 7"  (1.702 m)   Wt 201 lb (91.2 kg)   BMI 31.48 kg/m   Physical Exam      Assessment & Plan:     1. Mixed hyperlipidemia He is going to start back on daily metamucil. Will check lipids and follow up PSA in 3 months.        Lelon Huh, MD  [facility]

## 2017-01-15 NOTE — Patient Instructions (Signed)
   Start takiing metamucil original powder every day  Recheck PSA and cholesterol in May.

## 2017-04-19 ENCOUNTER — Ambulatory Visit
Admission: EM | Admit: 2017-04-19 | Discharge: 2017-04-19 | Disposition: A | Discharge status: Home / Self Care | Payer: Medicare Other | Attending: Emergency Medicine | Admitting: Emergency Medicine

## 2017-04-19 DIAGNOSIS — J029 Acute pharyngitis, unspecified: Secondary | ICD-10-CM | POA: Diagnosis not present

## 2017-04-19 DIAGNOSIS — J0110 Acute frontal sinusitis, unspecified: Secondary | ICD-10-CM | POA: Diagnosis not present

## 2017-04-19 DIAGNOSIS — M542 Cervicalgia: Secondary | ICD-10-CM | POA: Diagnosis not present

## 2017-04-19 DIAGNOSIS — M26621 Arthralgia of right temporomandibular joint: Secondary | ICD-10-CM | POA: Diagnosis not present

## 2017-04-19 MED ORDER — FLUTICASONE PROPIONATE 50 MCG/ACT NA SUSP: 2.0000 | Freq: Every day | NASAL | 0 refills | Status: DC

## 2017-04-19 MED ORDER — AMOXICILLIN-POT CLAVULANATE 875-125 MG PO TABS: 1.0000 | ORAL_TABLET | Freq: Two times a day (BID) | ORAL | 0 refills | Status: DC

## 2017-04-19 MED ORDER — TIZANIDINE HCL 2 MG PO TABS: 2.0000 mg | ORAL_TABLET | Freq: Three times a day (TID) | ORAL | 0 refills | Status: DC | PRN

## 2017-04-19 NOTE — ED Provider Notes (Signed)
HPI  SUBJECTIVE:  Larry Crawford is a 76 y.o. male who presents with right-sided sore throat with greenish nasal congestion, rhinorrhea, postnasal drip for the past 4 or 5 days. He reports a cough and wheezing. States that he is coughing the same material as his nasal congestion. Reports sinus pain and pressure during this time. He reports a right earache starting this morning, denies change in hearing, otorrhea. States that his ear feels "stopped up". He states that his ear pain is not associated with chewing or opening his mouth. He reports right-sided stiff neck/trapezial muscle and a right-sided headache pain also starting today. He is not sure if he slept on it wrong. States the headache was gradual onset, dull, achy. He denies fevers, chest pain, shortness of breath. No drooling, trismus, voice changes. No sensation of his throat swelling shut. No sick contacts. No GERD symptoms, allergy type symptoms. He is not sure if he grinds his teeth at night. No antibiotic in the past month. No antipyretic in the past 6-8 hours. He tried Sudafed, Tylenol, saltwater, half tabs of Soma, Norco. He is not taking any NSAIDs. No aggravating or alleviating factors. He has a past medical history seasonal allergies for which she takes Zyrtec, gout, BPH, he is status post tonsillectomy. He  is a remote smoker quit 50 years ago. No history of GERD, diabetes, hypertension, asthma, emphysema, COPD, renal disease. HRC:BULAGT, Kirstie Peri, MD     Past Medical History:  Diagnosis Date  . BPH (benign prostatic hypertrophy)   . Colon polyp   . Elevated red blood cell count   . Gout   . History of chicken pox   . History of measles   . History of mumps   . IBS (irritable bowel syndrome)     Past Surgical History:  Procedure Laterality Date  . aortic ultrasound  2013   normal. Screening ultrasound at [facility] per pt report  . 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 BUNIONECTOMY     left  . CHOLECYSTECTOMY  1997  . COLONOSCOPY     . INGUINAL HERNIA REPAIR  [phone]   2 on right/1 on left  . LIPOMA EXCISION  2013  . NOSE SURGERY     divided septum  . POLYPECTOMY    . [place]  . SHOULDER ARTHROSCOPY  1984 and 1987   left X2 for spurs  . TONSILLECTOMY AND ADENOIDECTOMY  2002  . UMBILICAL HERNIA REPAIR      Family History  Problem Relation Age of Onset  . Hypothyroidism Mother     Social History  Substance Use Topics  . Smoking status: Former Smoker    Packs/day: 0.75    Years: 5.00    Types: Cigarettes    Quit date: 11/19/1958  . Smokeless tobacco: Never Used  . Alcohol use 0.0 oz/week     Comment: 1 wine drink a month     Current Facility-Administered Medications:  .  0.9 %  sodium chloride infusion, 500 mL, Intravenous, Continuous, Ladene Artist, MD  Current Outpatient Prescriptions:  .  cetirizine (ZYRTEC) 10 MG tablet, Take 10 mg by mouth 2 (two) times a week. Takes 2-3 times a week , Disp: , Rfl:  .  Cholecalciferol (VITAMIN D3) 5000 units CAPS, Take 1 capsule by mouth daily., Disp: , Rfl:  .  GINSENG EXTRACT PO, Take by mouth., Disp: , Rfl:  .  HYDROcodone-acetaminophen (NORCO/VICODIN) 5-325 MG tablet, Take 1 tablet by mouth  every 6 (six) hours as needed for moderate pain., Disp: 60 tablet, Rfl: 0 .  Acetylcysteine 600 MG CAPS, Take 1 capsule by mouth daily., Disp: , Rfl:  .  allopurinol (ZYLOPRIM) 300 MG tablet, TAKE 1 TABLET DAILY, Disp: 90 tablet, Rfl: 4 .  amoxicillin-clavulanate (AUGMENTIN) 875-125 MG tablet, Take 1 tablet by mouth 2 (two) times daily. X 7 days, Disp: 14 tablet, Rfl: 0 .  arginine 500 MG tablet, Take 1 tablet by mouth 2 (two) times daily., Disp: , Rfl:  .  CIALIS 5 MG tablet, TAKE 1 TABLET DAILY, Disp: 90 tablet, Rfl: 3 .  cycloSPORINE (RESTASIS) 0.05 % ophthalmic emulsion, Place 2 drops into both eyes 2 (two) times daily., Disp: , Rfl:  .  fluticasone (FLONASE) 50 MCG/ACT nasal spray, Place 2 sprays into both nostrils daily., Disp: 16 g, Rfl: 0 .  INOSITOL  PO, Take 600 mg by mouth daily.  , Disp: , Rfl:  .  Kelp 225 MCG TABS, Take 1 tablet by mouth 2 (two) times a week. Takes 2-3 times a week , Disp: , Rfl:  .  Lutein 20 MG CAPS, Take 1 capsule by mouth daily.  , Disp: , Rfl:  .  Multiple Vitamins-Minerals (MULTIVITAMIN WITH MINERALS) tablet, Take 1 tablet by mouth daily.  , Disp: , Rfl:  .  niacin 500 MG tablet, Take 500 mg by mouth every other day.  , Disp: , Rfl:  .  POMEGRANATE-CITRUS BIOFLAV PO, Take 2 capsules by mouth daily., Disp: , Rfl:  .  POTASSIUM CITRATE PO, Take 99 mg by mouth once a week. Takes 1-2 a week , Disp: , Rfl:  .  Probiotic CAPS, Take 1 capsule by mouth daily., Disp: , Rfl:  .  tiZANidine (ZANAFLEX) 2 MG tablet, Take 1 tablet (2 mg total) by mouth every 8 (eight) hours as needed for muscle spasms., Disp: 30 tablet, Rfl: 0 .  vitamin B-12 (CYANOCOBALAMIN) 250 MCG tablet, Take 1 tablet by mouth daily., Disp: , Rfl:  .  Whey Protein POWD, Take 4 g by mouth daily. , Disp: , Rfl:   Allergies  Allergen Reactions  . Tetracyclines & Related Shortness Of Breath and Rash  . Colestipol Hcl     Other reaction(s): Diarrhea Fatigue  . Corn-Containing Products     Diarrhea and "makes my insides hurt"   . Pravastatin Sodium     Other reaction(s): Muscle Pain  . Shellfish Allergy   . Sulfa Antibiotics Hives  . Zetia [Ezetimibe]     Didn't feel good when he was taking it.      ROS  As noted in HPI.   Physical Exam  BP 140/79 (BP Location: Left Arm)   Pulse 68   Temp 98.1 F (36.7 C) (Oral)   Resp 18   Ht 5' 7.5" (1.715 m)   Wt 190 lb (86.2 kg)   SpO2 98%   BMI 29.32 kg/m   Constitutional: Well developed, well nourished, no acute distress Eyes:  EOMI, conjunctiva normal bilaterally HENT: Normocephalic, atraumatic,mucus membranes moist TMs normal bilaterally. Right ear: No pain with traction on pinna, no mastoid tenderness. Normal external ear right side. Positive tenderness at the right TMJ with crepitus with  opening and closing his mouth. Tonsils surgically absent. Slightly erythematous oropharynx. No obvious postnasal drip or cobblestoning. No nasal congestion, swollen and erythematous turbinates. Positive frontal sinus tenderness. Respiratory: Normal inspiratory effort lungs clear bilaterally.  Neck: Positive right-sided trapezial tenderness, muscle spasm. Tenderness at the insertion of  the trapezius at the occiput. No cervical lymphadenopathy, meningismus. Cardiovascular: Normal rate regular rhythm no murmurs rubs gallops  GI: nondistended skin: No rash, skin intact Musculoskeletal: no deformities Neurologic: Alert & oriented x 3, no focal neuro deficits Psychiatric: Speech and behavior appropriate   ED Course   Medications - No data to display  No orders of the defined types were placed in this encounter.   No results found for this or any previous visit (from the past 24 hour(s)). No results found.  ED Clinical Impression  Acute non-recurrent frontal sinusitis  Sore throat  Arthralgia of right temporomandibular joint  Neck pain on right side   ED Assessment/Plan  [facility] narcotic database reviewed. No opiate or controlled substances prescriptions in the past 6 months. Patient states that he is only taking half a Norco and Soma at a time. Since that this is not working for him, will have him discontinue the Soma and try Zanaflex. We'll start him on some Augmentin and Flonase for sinusitis, Benadryl Maalox mixture, ibuprofen 400 mg with 500 mg Tylenol 3-4 times a day for sore throat, neck pain, headache. Feel that headache is coming from his trapezial muscle spasm. Think that the otalgia is for his TMJ vs sore throat. Advised soft diet. Tylenol ibuprofen should help with this. Follow up with his primary care physician in several days. Discussed MDM, plan and followup with patient. Discussed sn/sx that should prompt return to the ED. Patient agrees with plan.   Meds ordered  this encounter  Medications  . tiZANidine (ZANAFLEX) 2 MG tablet    Sig: Take 1 tablet (2 mg total) by mouth every 8 (eight) hours as needed for muscle spasms.    Dispense:  30 tablet    Refill:  0  . fluticasone (FLONASE) 50 MCG/ACT nasal spray    Sig: Place 2 sprays into both nostrils daily.    Dispense:  16 g    Refill:  0  . amoxicillin-clavulanate (AUGMENTIN) 875-125 MG tablet    Sig: Take 1 tablet by mouth 2 (two) times daily. X 7 days    Dispense:  14 tablet    Refill:  0    *This clinic note was created using [employer]. Therefore, there may be occasional mistakes despite careful proofreading.  ?   Melynda Ripple, MD 04/19/17 1801

## 2017-04-19 NOTE — ED Triage Notes (Signed)
Pt c/o sore throat since Monday, and right sided neck, ear and head pain that started this morning.

## 2017-04-19 NOTE — Discharge Instructions (Signed)
Take the medication as written. Finish the Augmentin. Discontinue the Soma, try the Zanaflex. You may take 400 mg of motrin with 1 gram of tylenol up to 3-4 times a day as needed for pain. This is an effective combination for pain. Return to the ER if you get worse, have a fever >100.4, or for any concerns. Use a neti pot or the NeilMed sinus rinse as often as you want to to reduce nasal congestion. Follow the directions on the box.    Make sure you drink plenty of extra fluids.  Some people find salt water gargles and  Traditional Medicinal's "Throat Coat" tea helpful. Take 5 mL of liquid Benadryl and 5 mL of Maalox. Mix it together, and then hold it in your mouth for as long as you can and then swallow. You may do this 4 times a day.    Go to www.goodrx.com to look up your medications. This will give you a list of where you can find your prescriptions at the most affordable prices.

## 2017-06-10 ENCOUNTER — Telehealth: Payer: Self-pay | Admitting: Family Medicine

## 2017-06-10 DIAGNOSIS — E782 Mixed hyperlipidemia: Secondary | ICD-10-CM

## 2017-06-10 DIAGNOSIS — R972 Elevated prostate specific antigen [PSA]: Secondary | ICD-10-CM

## 2017-06-10 NOTE — Telephone Encounter (Signed)
Please advise patient it is time to checking fasting lipids and PSA. Please print order for him to pick up at front desk.

## 2017-06-10 NOTE — Telephone Encounter (Signed)
LMTCB

## 2017-06-12 NOTE — Telephone Encounter (Signed)
[email or web address] 

## 2017-06-14 NOTE — Telephone Encounter (Signed)
LMOVM for pt to return call 

## 2017-06-17 NOTE — Telephone Encounter (Signed)
Pt advised and lab slip  left up front-aa

## 2017-06-19 DIAGNOSIS — R972 Elevated prostate specific antigen [PSA]: Secondary | ICD-10-CM | POA: Diagnosis not present

## 2017-06-19 DIAGNOSIS — E782 Mixed hyperlipidemia: Secondary | ICD-10-CM | POA: Diagnosis not present

## 2017-06-20 LAB — LIPID PANEL
CHOL/HDL RATIO: 6.9 ratio — AB (ref 0.0–5.0)
Cholesterol, Total: 264 mg/dL — ABNORMAL HIGH (ref 100–199)
HDL: 38 mg/dL — AB (ref >39)
LDL Calculated: 185 mg/dL — ABNORMAL HIGH (ref 0–99)
Triglycerides: 204 mg/dL — ABNORMAL HIGH (ref 0–149)
VLDL CHOLESTEROL CAL: 41 mg/dL — AB (ref 5–40)

## 2017-06-20 LAB — PSA: PROSTATE SPECIFIC AG, SERUM: 12.4 ng/mL — AB (ref 0.0–4.0)

## 2017-07-08 DIAGNOSIS — L570 Actinic keratosis: Secondary | ICD-10-CM | POA: Diagnosis not present

## 2017-07-26 ENCOUNTER — Other Ambulatory Visit: Payer: Self-pay | Admitting: Family Medicine

## 2017-07-27 ENCOUNTER — Ambulatory Visit (INDEPENDENT_AMBULATORY_CARE_PROVIDER_SITE_OTHER): Payer: Medicare Other

## 2017-07-27 DIAGNOSIS — Z23 Encounter for immunization: Secondary | ICD-10-CM | POA: Diagnosis not present

## 2017-09-12 ENCOUNTER — Telehealth: Payer: Self-pay | Admitting: Family Medicine

## 2017-09-12 NOTE — Telephone Encounter (Signed)
Please follow up with patient regarding recommendation to try low dose of rosuvastatin based on labs done in August. He was going to research it and get back to [place]. If agreeable can send in prescription for 5mg  rosuvastatin daily, #30, rfx 3. Should take with OTC B complex vitamin.

## 2017-09-12 NOTE — Telephone Encounter (Signed)
No answer. Will try again later. 

## 2017-09-17 NOTE — Telephone Encounter (Signed)
Left message to call back in regards to the cholesterol medication.

## 2017-09-20 NOTE — Telephone Encounter (Signed)
Pt advised as below. Patient states he is allergic to Asprin. He broke out in red spots on his skin when he took it before and stated "it made him bleed but not internal bleeding."-Kris Mouton, RMA

## 2017-09-20 NOTE — Telephone Encounter (Signed)
OK. Then he needs to be sure to take 81mg  coated aspirin every day since he has high risk for heart attacks and strokes due to untreated cholesterol.

## 2017-09-20 NOTE — Telephone Encounter (Signed)
lmtcb-Kyran Whittier V Ryot Burrous, RMA  

## 2017-09-20 NOTE — Telephone Encounter (Signed)
Patient stated that he did research medication and decided not to take it, due to it being a statin.

## 2017-11-26 ENCOUNTER — Ambulatory Visit (INDEPENDENT_AMBULATORY_CARE_PROVIDER_SITE_OTHER): Payer: Medicare Other

## 2017-11-26 VITALS — BP 136/74 | HR 84 | Temp 97.8°F | Ht 68.0 in | Wt 207.0 lb

## 2017-11-26 DIAGNOSIS — Z0000 Encounter for general adult medical examination without abnormal findings: Secondary | ICD-10-CM | POA: Diagnosis not present

## 2017-11-26 NOTE — Progress Notes (Signed)
Subjective:   Larry Crawford is a 77 y.o. male who presents for Medicare Annual/Subsequent preventive examination.  Review of Systems:  N/A  Cardiac Risk Factors include: advanced age (>31men, >54 women);male gender     Objective:    Vitals: BP 136/74 (BP Location: Left Arm)   Pulse 84   Temp 97.8 F (36.6 C) (Oral)   Ht 5\' 8"  (1.727 m)   Wt 207 lb (93.9 kg)   BMI 31.47 kg/m   Body mass index is 31.47 kg/m.  Advanced Directives 11/26/2017 11/20/2016 07/13/2016  Does Patient Have a Medical Advance Directive? Yes Yes Yes  Type of Paramedic of [place];Living will [place];Living will [place];Living will  Copy of [place] in Chart? No - copy requested No - copy requested No - copy requested    Tobacco Social History   Tobacco Use  Smoking Status Former Smoker  . Packs/day: 0.75  . Years: 5.00  . Pack years: 3.75  . Types: Cigarettes  . Last attempt to quit: 11/19/1958  . Years since quitting: 59.0  Smokeless Tobacco Never Used     Counseling given: Not Answered   Clinical Intake:  Pre-visit preparation completed: Yes  Pain : 0-10 Pain Score: 3  Pain Location: Back Pain Orientation: Mid Pain Descriptors / Indicators: Aching Pain Frequency: Intermittent     Nutritional Status: BMI > 30  Obese Nutritional Risks: None Diabetes: No  How often do you need to have someone help you when you read instructions, pamphlets, or other written materials from your doctor or pharmacy?: 1 - Never  Interpreter Needed?: No  Information entered by :: [facility], LPN  Past Medical History:  Diagnosis Date  . BPH (benign prostatic hypertrophy)   . Colon polyp   . Elevated red blood cell count   . Gout   . History of chicken pox   . History of measles   . History of mumps   . IBS (irritable bowel syndrome)    Past Surgical History:  Procedure Laterality Date  . aortic ultrasound   2013   normal. Screening ultrasound at [facility] per pt report  . 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 BUNIONECTOMY     left  . CHOLECYSTECTOMY  1997  . COLONOSCOPY    . INGUINAL HERNIA REPAIR  [phone]   2 on right/1 on left  . LIPOMA EXCISION  2013  . NOSE SURGERY     divided septum  . POLYPECTOMY    . [place]  . SHOULDER ARTHROSCOPY  1984 and 1987   left X2 for spurs  . TONSILLECTOMY AND ADENOIDECTOMY  2002  . UMBILICAL HERNIA REPAIR     Family History  Problem Relation Age of Onset  . Hypothyroidism Mother   . Diabetes Brother   . Obesity Brother   . Cancer Brother        throat   Social History   Socioeconomic History  . Marital status: Married    Spouse name: None  . Number of children: 4  . Years of education: None  . Highest education level: 12th grade  Social Needs  . Financial resource strain: Not hard at all  . Food insecurity - worry: Never true  . Food insecurity - inability: Never true  . Transportation needs - medical: No  . Transportation needs - non-medical: No  Occupational History  . Occupation: retired  Tobacco Use  .  Smoking status: Former Smoker    Packs/day: 0.75    Years: 5.00    Pack years: 3.75    Types: Cigarettes    Last attempt to quit: 11/19/1958    Years since quitting: 59.0  . Smokeless tobacco: Never Used  Substance and Sexual Activity  . Alcohol use: Yes    Alcohol/week: 0.0 oz    Comment: 1 wine drink a month  . Drug use: No  . Sexual activity: None  Other Topics Concern  . None  Social History Narrative   Pt went to [employer] school through the [employer].     Outpatient Encounter Medications as of 11/26/2017  Medication Sig  . Acetylcysteine 600 MG CAPS Take 1 capsule by mouth daily.  Marland Kitchen allopurinol (ZYLOPRIM) 300 MG tablet TAKE 1 TABLET DAILY  . arginine 500 MG tablet Take 1 tablet by mouth 2 (two) times daily.  . Cholecalciferol (VITAMIN D3) 5000 units CAPS Take 1 capsule by mouth daily.  Marland Kitchen CIALIS 5 MG tablet TAKE 1 TABLET  DAILY  . fexofenadine (ALLEGRA) 180 MG tablet Take 180 mg by mouth daily.  Marland Kitchen GINSENG EXTRACT PO Take by mouth.  . INOSITOL PO Take 600 mg by mouth daily.    Marland Kitchen Kelp 225 MCG TABS Take 1 tablet by mouth 2 (two) times a week. Takes 2-3 times a week   . Lutein 20 MG CAPS Take 1 capsule by mouth daily.    . Multiple Vitamins-Minerals (MULTIVITAMIN WITH MINERALS) tablet Take 1 tablet by mouth daily.    . niacin 500 MG tablet Take 500 mg by mouth at bedtime.   . Probiotic CAPS Take 1 capsule by mouth daily.  . vitamin B-12 (CYANOCOBALAMIN) 250 MCG tablet Take 1 tablet by mouth daily.  . Whey Protein POWD Take 4 g by mouth once a week.   Marland Kitchen HYDROcodone-acetaminophen (NORCO/VICODIN) 5-325 MG tablet Take 1 tablet by mouth every 6 (six) hours as needed for moderate pain. (Patient not taking: Reported on 11/26/2017)  . [DISCONTINUED] amoxicillin-clavulanate (AUGMENTIN) 875-125 MG tablet Take 1 tablet by mouth 2 (two) times daily. X 7 days  . [DISCONTINUED] cetirizine (ZYRTEC) 10 MG tablet Take 10 mg by mouth 2 (two) times a week. Takes 2-3 times a week   . [DISCONTINUED] cycloSPORINE (RESTASIS) 0.05 % ophthalmic emulsion Place 2 drops into both eyes 2 (two) times daily.  . [DISCONTINUED] fluticasone (FLONASE) 50 MCG/ACT nasal spray Place 2 sprays into both nostrils daily.  . [DISCONTINUED] POMEGRANATE-CITRUS BIOFLAV PO Take 2 capsules by mouth daily.  . [DISCONTINUED] POTASSIUM CITRATE PO Take 99 mg by mouth once a week. Takes 1-2 a week   . [DISCONTINUED] tiZANidine (ZANAFLEX) 2 MG tablet Take 1 tablet (2 mg total) by mouth every 8 (eight) hours as needed for muscle spasms.   Facility-Administered Encounter Medications as of 11/26/2017  Medication  . 0.9 %  sodium chloride infusion    Activities of Daily Living In your present state of health, do you have any difficulty performing the following activities: 11/26/2017  Hearing? Y  Comment Pt has bilateral hearing aids but does not wear them.   Vision? N    Difficulty concentrating or making decisions? N  Walking or climbing stairs? N  Dressing or bathing? N  Doing errands, shopping? N  Preparing Food and eating ? N  Using the Toilet? N  In the past six months, have you accidently leaked urine? N  Do you have problems with loss of bowel control? N  Managing your  Medications? N  Managing your Finances? N  Housekeeping or managing your Housekeeping? N  Some recent data might be hidden    Patient Care Team: Birdie Sons, MD as PCP - General (Family Medicine) Lorelee Cover., MD as Consulting Physician (Ophthalmology)   Assessment:   This is a routine wellness examination for Rafael.  Exercise Activities and Dietary recommendations Current Exercise Habits: The patient does not participate in regular exercise at present, Exercise limited by: None identified  Goals    . DIET - REDUCE CALORIE INTAKE     Pt to start dieting and go on the HCG diet and lower calorie intake in daily diet.        Fall Risk Fall Risk  11/26/2017 11/20/2016 10/04/2015  Falls in the past year? No No No   Is the patient's home free of loose throw rugs in walkways, pet beds, electrical cords, etc?   yes      Grab bars in the bathroom? No, pt declines having these at this time.      Handrails on the stairs?  N/A      Adequate lighting?   yes  Timed Get Up and Go Performed: N/A  Depression Screen PHQ 2/9 Scores 11/26/2017 11/20/2016 10/04/2015  PHQ - 2 Score 0 0 4  PHQ- 9 Score - - 9    Cognitive Function: Pt declined screening today.     6CIT Screen 11/20/2016  What Year? 0 points  What month? 0 points  What time? 0 points  Count back from 20 0 points  Months in reverse 0 points  Repeat phrase 2 points  Total Score 2    Immunization History  Administered Date(s) Administered  . Influenza, High Dose Seasonal PF 09/28/2015, 07/27/2017  . Pneumococcal Conjugate-13 05/11/2014  . Pneumococcal Polysaccharide-23 07/29/2005  . Tdap 08/25/2014  .  Zoster 02/05/2013    Qualifies for Shingles Vaccine? Pt declines today.   Screening Tests Health Maintenance  Topic Date Due  . COLONOSCOPY  06/04/2016  . TETANUS/TDAP  08/25/2024  . INFLUENZA VACCINE  Completed  . PNA vac Low Risk Adult  Completed   Cancer Screenings: Lung: Low Dose CT Chest recommended if Age 48-80 years, 30 pack-year currently smoking OR have quit w/in 15years. Patient does not qualify. Colorectal: Last completed 05/2011. Pt is on a 5 year regimen. Pt declined referral today.  Additional Screenings:  Hepatitis B/HIV/Syphillis: Pt declines today.  Hepatitis C Screening: Pt declines today.     Plan:  I have personally reviewed and addressed the Medicare Annual Wellness questionnaire and have noted the following in the patient's chart:  A. Medical and social history B. Use of alcohol, tobacco or illicit drugs  C. Current medications and supplements D. Functional ability and status E.  Nutritional status F.  Physical activity G. Advance directives H. List of other physicians I.  Hospitalizations, surgeries, and ER visits in previous 12 months J.  [place] such as hearing and vision if needed, cognitive and depression L. Referrals and appointments - none  In addition, I have reviewed and discussed with patient certain preventive protocols, quality metrics, and best practice recommendations. A written personalized care plan for preventive services as well as general preventive health recommendations were provided to patient.  See attached scanned questionnaire for additional information.   Signed,  Fabio Neighbors, LPN Nurse Health Advisor   Nurse Recommendations: Pt declines referral for colonoscopy and states he does not plan to have anymore. Offered information regarding the  cologuard and he declined that as well.

## 2017-11-26 NOTE — Patient Instructions (Addendum)
Larry Crawford , Thank you for taking time to come for your Medicare Wellness Visit. I appreciate your ongoing commitment to your health goals. Please review the following plan we discussed and let me know if I can assist you in the future.   Screening recommendations/referrals: Colonoscopy: Pt declines referral and cologuard information today. Recommended yearly ophthalmology/optometry visit for glaucoma screening and checkup Recommended yearly dental visit for hygiene and checkup  Vaccinations: Influenza vaccine: Up to date Pneumococcal vaccine: Up to date Tdap vaccine: Up to date Shingles vaccine: Pt declines today.     Advanced directives: Please bring a copy of your POA (Power of Attorney) and/or Living Will to your next appointment.   Conditions/risks identified: Pt to start dieting and go on the HCG diet and lower calorie intake in daily diet.    Next appointment:   Preventive Care 17 Years and Older, Male Preventive care refers to lifestyle choices and visits with your health care provider that can promote health and wellness. What does preventive care include?  A yearly physical exam. This is also called an annual well check.  Dental exams once or twice a year.  Routine eye exams. Ask your health care provider how often you should have your eyes checked.  Personal lifestyle choices, including:  Daily care of your teeth and gums.  Regular physical activity.  Eating a healthy diet.  Avoiding tobacco and drug use.  Limiting alcohol use.  Practicing safe sex.  Taking low doses of aspirin every day.  Taking vitamin and mineral supplements as recommended by your health care provider. What happens during an annual well check? The services and screenings done by your health care provider during your annual well check will depend on your age, overall health, lifestyle risk factors, and family history of disease. Counseling  Your health care provider may ask you  questions about your:  Alcohol use.  Tobacco use.  Drug use.  Emotional well-being.  Home and relationship well-being.  Sexual activity.  Eating habits.  History of falls.  Memory and ability to understand (cognition).  Work and work Statistician. Screening  You may have the following tests or measurements:  Height, weight, and BMI.  Blood pressure.  Lipid and cholesterol levels. These may be checked every 5 years, or more frequently if you are over 67 years old.  Skin check.  Lung cancer screening. You may have this screening every year starting at age 65 if you have a 30-pack-year history of smoking and currently smoke or have quit within the past 15 years.  Fecal occult blood test (FOBT) of the stool. You may have this test every year starting at age 84.  Flexible sigmoidoscopy or colonoscopy. You may have a sigmoidoscopy every 5 years or a colonoscopy every 10 years starting at age 22.  Prostate cancer screening. Recommendations will vary depending on your family history and other risks.  Hepatitis C blood test.  Hepatitis B blood test.  Sexually transmitted disease (STD) testing.  Diabetes screening. This is done by checking your blood sugar (glucose) after you have not eaten for a while (fasting). You may have this done every 1-3 years.  Abdominal aortic aneurysm (AAA) screening. You may need this if you are a current or former smoker.  Osteoporosis. You may be screened starting at age 52 if you are at high risk. Talk with your health care provider about your test results, treatment options, and if necessary, the need for more tests. Vaccines  Your health care provider  may recommend certain vaccines, such as:  Influenza vaccine. This is recommended every year.  Tetanus, diphtheria, and acellular pertussis (Tdap, Td) vaccine. You may need a Td booster every 10 years.  Zoster vaccine. You may need this after age 79.  Pneumococcal 13-valent conjugate  (PCV13) vaccine. One dose is recommended after age 24.  Pneumococcal polysaccharide (PPSV23) vaccine. One dose is recommended after age 10. Talk to your health care provider about which screenings and vaccines you need and how often you need them. This information is not intended to replace advice given to you by your health care provider. Make sure you discuss any questions you have with your health care provider. Document Released: 12/02/2015 Document Revised: 07/25/2016 Document Reviewed: 09/06/2015 Elsevier Interactive Patient Education  2017 [employer] Prevention in the Home Falls can cause injuries. They can happen to people of all ages. There are many things you can do to make your home safe and to help prevent falls. What can I do on the outside of my home?  Regularly fix the edges of walkways and driveways and fix any cracks.  Remove anything that might make you trip as you walk through a door, such as a raised step or threshold.  Trim any bushes or trees on the path to your home.  Use bright outdoor lighting.  Clear any walking paths of anything that might make someone trip, such as rocks or tools.  Regularly check to see if handrails are loose or broken. Make sure that both sides of any steps have handrails.  Any raised decks and porches should have guardrails on the edges.  Have any leaves, snow, or ice cleared regularly.  Use sand or salt on walking paths during winter.  Clean up any spills in your garage right away. This includes oil or grease spills. What can I do in the bathroom?  Use night lights.  Install grab bars by the toilet and in the tub and shower. Do not use towel bars as grab bars.  Use non-skid mats or decals in the tub or shower.  If you need to sit down in the shower, use a plastic, non-slip stool.  Keep the floor dry. Clean up any water that spills on the floor as soon as it happens.  Remove soap buildup in the tub or shower  regularly.  Attach bath mats securely with double-sided non-slip rug tape.  Do not have throw rugs and other things on the floor that can make you trip. What can I do in the bedroom?  Use night lights.  Make sure that you have a light by your bed that is easy to reach.  Do not use any sheets or blankets that are too big for your bed. They should not hang down onto the floor.  Have a firm chair that has side arms. You can use this for support while you get dressed.  Do not have throw rugs and other things on the floor that can make you trip. What can I do in the kitchen?  Clean up any spills right away.  Avoid walking on wet floors.  Keep items that you use a lot in easy-to-reach places.  If you need to reach something above you, use a strong step stool that has a grab bar.  Keep electrical cords out of the way.  Do not use floor polish or wax that makes floors slippery. If you must use wax, use non-skid floor wax.  Do not have throw rugs  and other things on the floor that can make you trip. What can I do with my stairs?  Do not leave any items on the stairs.  Make sure that there are handrails on both sides of the stairs and use them. Fix handrails that are broken or loose. Make sure that handrails are as long as the stairways.  Check any carpeting to make sure that it is firmly attached to the stairs. Fix any carpet that is loose or worn.  Avoid having throw rugs at the top or bottom of the stairs. If you do have throw rugs, attach them to the floor with carpet tape.  Make sure that you have a light switch at the top of the stairs and the bottom of the stairs. If you do not have them, ask someone to add them for you. What else can I do to help prevent falls?  Wear shoes that:  Do not have high heels.  Have rubber bottoms.  Are comfortable and fit you well.  Are closed at the toe. Do not wear sandals.  If you use a stepladder:  Make sure that it is fully  opened. Do not climb a closed stepladder.  Make sure that both sides of the stepladder are locked into place.  Ask someone to hold it for you, if possible.  Clearly mark and make sure that you can see:  Any grab bars or handrails.  First and last steps.  Where the edge of each step is.  Use tools that help you move around (mobility aids) if they are needed. These include:  Canes.  Walkers.  Scooters.  Crutches.  Turn on the lights when you go into a dark area. Replace any light bulbs as soon as they burn out.  Set up your furniture so you have a clear path. Avoid moving your furniture around.  If any of your floors are uneven, fix them.  If there are any pets around you, be aware of where they are.  Review your medicines with your doctor. Some medicines can make you feel dizzy. This can increase your chance of falling. Ask your doctor what other things that you can do to help prevent falls. This information is not intended to replace advice given to you by your health care provider. Make sure you discuss any questions you have with your health care provider. Document Released: 09/01/2009 Document Revised: 04/12/2016 Document Reviewed: 12/10/2014 Elsevier Interactive Patient Education  2017 [employer].

## 2017-11-29 ENCOUNTER — Encounter: Payer: Medicare Other | Admitting: Family Medicine

## 2017-11-29 ENCOUNTER — Ambulatory Visit (INDEPENDENT_AMBULATORY_CARE_PROVIDER_SITE_OTHER): Payer: Medicare Other | Admitting: Family Medicine

## 2017-11-29 ENCOUNTER — Encounter: Payer: Self-pay | Admitting: Family Medicine

## 2017-11-29 VITALS — BP 120/68 | HR 61 | Temp 98.0°F | Resp 16 | Wt 204.0 lb

## 2017-11-29 DIAGNOSIS — N400 Enlarged prostate without lower urinary tract symptoms: Secondary | ICD-10-CM | POA: Diagnosis not present

## 2017-11-29 DIAGNOSIS — R739 Hyperglycemia, unspecified: Secondary | ICD-10-CM

## 2017-11-29 DIAGNOSIS — N529 Male erectile dysfunction, unspecified: Secondary | ICD-10-CM | POA: Diagnosis not present

## 2017-11-29 DIAGNOSIS — E782 Mixed hyperlipidemia: Secondary | ICD-10-CM

## 2017-11-29 MED ORDER — TADALAFIL 5 MG PO TABS: 5.0000 mg | ORAL_TABLET | Freq: Every day | ORAL | 3 refills | Status: DC

## 2017-11-29 NOTE — Progress Notes (Deleted)
Patient: Larry Crawford Male    DOB: Mar 21, 1941   77 y.o.   MRN: [id number] Visit Date: 11/29/2017  Today's Provider: Lelon Huh, MD   No chief complaint on file.  Subjective:    HPI    Lipid/Cholesterol, Follow-up:   Last seen for this 11 months ago.  Management since that visit includes; restartd metamucil. Since last ov, checked labs. Cholesterol very high, recommended rosuvastatin. Patient declined. Advised to take daily aspirin. Patient refused stating aspirin makes him break out.  Last Lipid Panel:    Component Value Date/Time   CHOL 264 (H) 06/19/2017 0814   TRIG 204 (H) 06/19/2017 0814   HDL 38 (L) 06/19/2017 0814   CHOLHDL 6.9 (H) 06/19/2017 0814   LDLCALC 185 (H) 06/19/2017 1017    He reports {excellent/good/fair/poor:19665} compliance with treatment. He {ACTION; IS/IS [id number]} having side effects. ***  Wt Readings from Last 3 Encounters:  11/26/17 207 lb (93.9 kg)  04/19/17 190 lb (86.2 kg)  01/15/17 201 lb (91.2 kg)    ------------------------------------------------------------------------    Allergies  Allergen Reactions  . Tetracyclines & Related Shortness Of Breath and Rash  . Colestipol Hcl     Other reaction(s): Diarrhea Fatigue  . Corn-Containing Products     Diarrhea and "makes my insides hurt"   . Pravastatin Sodium     Other reaction(s): Muscle Pain  . Shellfish Allergy   . Sulfa Antibiotics Hives  . Zetia [Ezetimibe]     Didn't feel good when he was taking it.      Current Outpatient Medications:  .  Acetylcysteine 600 MG CAPS, Take 1 capsule by mouth daily., Disp: , Rfl:  .  allopurinol (ZYLOPRIM) 300 MG tablet, TAKE 1 TABLET DAILY, Disp: 90 tablet, Rfl: 4 .  arginine 500 MG tablet, Take 1 tablet by mouth 2 (two) times daily., Disp: , Rfl:  .  Cholecalciferol (VITAMIN D3) 5000 units CAPS, Take 1 capsule by mouth daily., Disp: , Rfl:  .  CIALIS 5 MG tablet, TAKE 1 TABLET DAILY, Disp: 90 tablet, Rfl: 3 .   fexofenadine (ALLEGRA) 180 MG tablet, Take 180 mg by mouth daily., Disp: , Rfl:  .  GINSENG EXTRACT PO, Take by mouth., Disp: , Rfl:  .  HYDROcodone-acetaminophen (NORCO/VICODIN) 5-325 MG tablet, Take 1 tablet by mouth every 6 (six) hours as needed for moderate pain. (Patient not taking: Reported on 11/26/2017), Disp: 60 tablet, Rfl: 0 .  INOSITOL PO, Take 600 mg by mouth daily.  , Disp: , Rfl:  .  Kelp 225 MCG TABS, Take 1 tablet by mouth 2 (two) times a week. Takes 2-3 times a week , Disp: , Rfl:  .  Lutein 20 MG CAPS, Take 1 capsule by mouth daily.  , Disp: , Rfl:  .  Multiple Vitamins-Minerals (MULTIVITAMIN WITH MINERALS) tablet, Take 1 tablet by mouth daily.  , Disp: , Rfl:  .  niacin 500 MG tablet, Take 500 mg by mouth at bedtime. , Disp: , Rfl:  .  Probiotic CAPS, Take 1 capsule by mouth daily., Disp: , Rfl:  .  vitamin B-12 (CYANOCOBALAMIN) 250 MCG tablet, Take 1 tablet by mouth daily., Disp: , Rfl:  .  [place] Protein POWD, Take 4 g by mouth once a week. , Disp: , Rfl:   Current Facility-Administered Medications:  .  0.9 %  sodium chloride infusion, 500 mL, Intravenous, Continuous, Ladene Artist, MD  Review of Systems  Social History   Tobacco Use  .  Smoking status: Former Smoker    Packs/day: 0.75    Years: 5.00    Pack years: 3.75    Types: Cigarettes    Last attempt to quit: 11/19/1958    Years since quitting: 59.0  . Smokeless tobacco: Never Used  Substance Use Topics  . Alcohol use: Yes    Alcohol/week: 0.0 oz    Comment: 1 wine drink a month   Objective:   There were no vitals taken for this visit. There were no vitals filed for this visit.   Physical Exam      Assessment & Plan:           Lelon Huh, MD  [facility]

## 2017-11-29 NOTE — Progress Notes (Signed)
Patient: Larry Crawford Male    DOB: May 06, 1941   77 y.o.   MRN: [id number] Visit Date: 11/29/2017  Today's Provider: Lelon Huh, MD   Chief Complaint  Patient presents with  . Follow-up  . Hyperlipidemia   Subjective:     Patient saw [facility] 11/26/2017 for AWV.  HPI   Lipid/Cholesterol, Follow-up:   Last seen for this 11 months ago.  Management since that visit includes; restarted daily metamucil. Since last office visit, cholesterol was checked showing high levels. Recommended rosuvastatin, which he declined. Advised he take a daily aspirin, which he declined, due to it causing him to breakout.  Last Lipid Panel:    Component Value Date/Time   CHOL 264 (H) 06/19/2017 0814   TRIG 204 (H) 06/19/2017 0814   HDL 38 (L) 06/19/2017 0814   CHOLHDL 6.9 (H) 06/19/2017 0814   LDLCALC 185 (H) 06/19/2017 6546    He reports good compliance with treatment. He is not having side effects. none  Wt Readings from Last 3 Encounters:  11/29/17 204 lb (92.5 kg)  11/26/17 207 lb (93.9 kg)  04/19/17 190 lb (86.2 kg)   He is taking 500mg  niacin and metamucil every day.  ----------------------------------------------------------------  Follow up BPH He reports Cialis continues to work well. Usually need to get up once to urinate at night. Is not having any adverse effects from medication.   Follow up Hyperglycemia Lab Results  Component Value Date   HGBA1C 5.7 (H) 01/09/2017   Is following low sugar diet and trying to exercise regularly.    Allergies  Allergen Reactions  . Tetracyclines & Related Shortness Of Breath and Rash  . Colestipol Hcl     Other reaction(s): Diarrhea Fatigue  . Corn-Containing Products     Diarrhea and "makes my insides hurt"   . Pravastatin Sodium     Other reaction(s): Muscle Pain  . Shellfish Allergy   . Sulfa Antibiotics Hives  . Zetia [Ezetimibe]     Didn't feel good when he was taking it.      Current Outpatient Medications:    .  Acetylcysteine 600 MG CAPS, Take 1 capsule by mouth daily., Disp: , Rfl:  .  allopurinol (ZYLOPRIM) 300 MG tablet, TAKE 1 TABLET DAILY, Disp: 90 tablet, Rfl: 4 .  arginine 500 MG tablet, Take 1 tablet by mouth 2 (two) times daily., Disp: , Rfl:  .  Cholecalciferol (VITAMIN D3) 5000 units CAPS, Take 1 capsule by mouth daily., Disp: , Rfl:  .  CIALIS 5 MG tablet, TAKE 1 TABLET DAILY, Disp: 90 tablet, Rfl: 3 .  fexofenadine (ALLEGRA) 180 MG tablet, Take 180 mg by mouth daily., Disp: , Rfl:  .  GINSENG EXTRACT PO, Take by mouth., Disp: , Rfl:  .  HYDROcodone-acetaminophen (NORCO/VICODIN) 5-325 MG tablet, Take 1 tablet by mouth every 6 (six) hours as needed for moderate pain., Disp: 60 tablet, Rfl: 0 .  INOSITOL PO, Take 600 mg by mouth daily.  , Disp: , Rfl:  .  Kelp 225 MCG TABS, Take 1 tablet by mouth 2 (two) times a week. Takes 2-3 times a week , Disp: , Rfl:  .  Lutein 20 MG CAPS, Take 1 capsule by mouth daily.  , Disp: , Rfl:  .  Multiple Vitamins-Minerals (MULTIVITAMIN WITH MINERALS) tablet, Take 1 tablet by mouth daily.  , Disp: , Rfl:  .  niacin 500 MG tablet, Take 500 mg by mouth at bedtime. , Disp: , Rfl:  .  Probiotic CAPS, Take 1 capsule by mouth daily., Disp: , Rfl:  .  vitamin B-12 (CYANOCOBALAMIN) 250 MCG tablet, Take 1 tablet by mouth daily., Disp: , Rfl:  .  Whey Protein POWD, Take 4 g by mouth once a week. , Disp: , Rfl:   Current Facility-Administered Medications:  .  0.9 %  sodium chloride infusion, 500 mL, Intravenous, Continuous, Ladene Artist, MD  Review of Systems  Social History   Tobacco Use  . Smoking status: Former Smoker    Packs/day: 0.75    Years: 5.00    Pack years: 3.75    Types: Cigarettes    Last attempt to quit: 11/19/1958    Years since quitting: 59.0  . Smokeless tobacco: Never Used  Substance Use Topics  . Alcohol use: Yes    Alcohol/week: 0.0 oz    Comment: 1 wine drink a month   Objective:   BP 120/68 (BP Location: Right Arm, Patient  Position: Sitting, Cuff Size: Large)   Pulse 61   Temp 98 F (36.7 C)   Resp 16   Wt 204 lb (92.5 kg)   SpO2 98%   BMI 31.02 kg/m  Vitals:   11/29/17 1158  BP: 120/68  Pulse: 61  Resp: 16  Temp: 98 F (36.7 C)  SpO2: 98%  Weight: 204 lb (92.5 kg)     Physical Exam   General Appearance:    Alert, cooperative, no distress  Eyes:    PERRL, conjunctiva/corneas clear, EOM's intact       Lungs:     Clear to auscultation bilaterally, respirations unlabored  Heart:    Regular rate and rhythm  Neurologic:   Awake, alert, oriented x 3. No apparent focal neurological           defect.           Assessment & Plan:     1. Mixed hyperlipidemia Extensively counseled regarding increased risk of stroke and MI. He is adamantly opposed to trying another statin. Advised that skin lesions he gets from aspirin is likely petechia and not an actual allergy. Recommended he take ASA daily to reduce CVA and MI risk, but he does not want to. He is taking daily niacin and metamucil which might convey some risk reduction since he is not taking a statin.   - Lipid panel  2. 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  - tadalafil (CIALIS) 5 MG tablet; Take 1 tablet (5 mg total) by mouth daily.  Dispense: 90 tablet; Refill: 3  4. Benign prostatic hyperplasia without lower urinary tract symptoms Well controlled.   - tadalafil (CIALIS) 5 MG tablet; Take 1 tablet (5 mg total) by mouth daily.  Dispense: 90 tablet; Refill: 3  He declined referral for follow up colonoscopy. Has history of tubular adenoma in 2007, but no polyps on follow up colonoscopy at [facility] in 2012.       Lelon Huh, MD  [facility]

## 2017-11-29 NOTE — Patient Instructions (Addendum)
   The CDC recommends two doses of Shingrix (the shingles vaccine) separated by 2 to 6 months for adults age 78 years and older. I recommend checking with your insurance plan regarding coverage for this vaccine.    Recommend taking 81mg  enteric coated aspirin to reduce risk of vascular events such as heart attacks and strokes.    Please go to the lab draw center in [address] on the second floor of [facility] after February 1st to have blood drawn

## 2017-12-27 ENCOUNTER — Encounter: Payer: Self-pay | Admitting: Emergency Medicine

## 2017-12-27 ENCOUNTER — Emergency Department
Admission: EM | Admit: 2017-12-27 | Discharge: 2017-12-27 | Disposition: A | Discharge status: Home / Self Care | Payer: Medicare Other | Attending: Emergency Medicine | Admitting: Emergency Medicine

## 2017-12-27 DIAGNOSIS — Y92830 Public park as the place of occurrence of the external cause: Secondary | ICD-10-CM | POA: Insufficient documentation

## 2017-12-27 DIAGNOSIS — Y9301 Activity, walking, marching and hiking: Secondary | ICD-10-CM | POA: Diagnosis not present

## 2017-12-27 DIAGNOSIS — Z23 Encounter for immunization: Secondary | ICD-10-CM | POA: Insufficient documentation

## 2017-12-27 DIAGNOSIS — W540XXA Bitten by dog, initial encounter: Secondary | ICD-10-CM | POA: Insufficient documentation

## 2017-12-27 DIAGNOSIS — S41112A Laceration without foreign body of left upper arm, initial encounter: Secondary | ICD-10-CM | POA: Insufficient documentation

## 2017-12-27 DIAGNOSIS — Y999 Unspecified external cause status: Secondary | ICD-10-CM | POA: Diagnosis not present

## 2017-12-27 DIAGNOSIS — S4992XA Unspecified injury of left shoulder and upper arm, initial encounter: Secondary | ICD-10-CM | POA: Diagnosis present

## 2017-12-27 DIAGNOSIS — Z87891 Personal history of nicotine dependence: Secondary | ICD-10-CM | POA: Insufficient documentation

## 2017-12-27 DIAGNOSIS — Z79899 Other long term (current) drug therapy: Secondary | ICD-10-CM | POA: Insufficient documentation

## 2017-12-27 DIAGNOSIS — S51812A Laceration without foreign body of left forearm, initial encounter: Secondary | ICD-10-CM | POA: Diagnosis not present

## 2017-12-27 MED ORDER — RABIES IMMUNE GLOBULIN 150 UNIT/ML IM INJ
20.0000 [IU]/kg | INJECTION | Freq: Once | INTRAMUSCULAR | Status: AC
  Administered 2017-12-27: 1800 [IU] via INTRAMUSCULAR
  Filled 2017-12-27: qty 12

## 2017-12-27 MED ORDER — TETANUS-DIPHTH-ACELL PERTUSSIS 5-2.5-18.5 LF-MCG/0.5 IM SUSP
0.5000 mL | Freq: Once | INTRAMUSCULAR | Status: AC
  Administered 2017-12-27: 0.5 mL via INTRAMUSCULAR
  Filled 2017-12-27: qty 0.5

## 2017-12-27 MED ORDER — HYDROCODONE-ACETAMINOPHEN 5-325 MG PO TABS: 1.0000 | ORAL_TABLET | ORAL | 0 refills | Status: DC | PRN

## 2017-12-27 MED ORDER — TRAMADOL HCL 50 MG PO TABS: 50.0000 mg | ORAL_TABLET | Freq: Four times a day (QID) | ORAL | 0 refills | Status: DC | PRN

## 2017-12-27 MED ORDER — AMOXICILLIN-POT CLAVULANATE 875-125 MG PO TABS: 1.0000 | ORAL_TABLET | Freq: Two times a day (BID) | ORAL | 0 refills | Status: AC

## 2017-12-27 MED ORDER — RABIES VACCINE, PCEC IM SUSR
1.0000 mL | Freq: Once | INTRAMUSCULAR | Status: AC
  Administered 2017-12-27: 1 mL via INTRAMUSCULAR
  Filled 2017-12-27: qty 1

## 2017-12-27 NOTE — ED Triage Notes (Signed)
Pt reports was walking in the park and was bitten by a dog. Pt with puncture and skin tear to left forearm. Bleeding controlled. Pt requested information on dog but the owner and dog took off running. Pt reports he did call the police to file a report but at this time he has not idea of the dogs immunization status.  Pt reports [place] [employer] aware.

## 2017-12-27 NOTE — ED Notes (Signed)
See triage note  States he was bitten by a dog while walking in park  The owner and dog ran off  Puncture wound and skin tear to forearm

## 2017-12-27 NOTE — ED Provider Notes (Signed)
[facility] Emergency Department Provider Note  ____________________________________________  Time seen: Approximately 11:44 AM  I have reviewed the triage vital signs and the nursing notes.   HISTORY  Chief Complaint Animal Bite    HPI Larry Crawford is a 77 y.o. male that presents to the emergency department for evaluation of dog bite to left arm.  Patient was walking in the park when a dog lunged and bit his left arm.  Owner and dog ran off and patient was unable to get any information from Doctor, general practice.  He is not on any blood thinners.  No diabetes.  He is unsure of last tetanus shot.  No additional injuries.  Past Medical History:  Diagnosis Date  . BPH (benign prostatic hypertrophy)   . Colon polyp   . Elevated red blood cell count   . Gout   . History of chicken pox   . History of measles   . History of mumps   . IBS (irritable bowel syndrome)     Patient Active Problem List   Diagnosis Date Noted  . Hyperglycemia 12/14/2016  . Arthritis 07/16/2016  .   09/28/2015  . Alopecia 09/26/2015  . Arthralgia of right hand 09/26/2015  . BPH (benign prostatic hyperplasia) 09/26/2015  . Bursitis of left shoulder 09/26/2015  . Moderate episode of recurrent major depressive disorder ([place]) 09/26/2015  . Tinnitus 09/26/2015  . Vitamin D deficiency 09/26/2015  . 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 Elevated prostate specific antigen (PSA) 08/25/2012  . Myalgia and myositis 05/08/2010  . History of colon polyps 02/21/2010  . Breast lump 06/16/2009  . 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 IBS (irritable bowel syndrome) 03/29/2009  . Malaise and fatigue 03/29/2009  . Gout 03/15/2009  . Mixed hyperlipidemia 03/15/2009    Past Surgical History:  Procedure Laterality Date  . aortic ultrasound  2013   normal. Screening ultrasound at [facility] per pt report  . 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 BUNIONECTOMY     left  . CHOLECYSTECTOMY   1997  . COLONOSCOPY    . INGUINAL HERNIA REPAIR  [phone]   2 on right/1 on left  . LIPOMA EXCISION  2013  . NOSE SURGERY     divided septum  . POLYPECTOMY    . [place]  . SHOULDER ARTHROSCOPY  1984 and 1987   left X2 for spurs  . TONSILLECTOMY AND ADENOIDECTOMY  2002  . UMBILICAL HERNIA REPAIR      Prior to Admission medications   Medication Sig Start Date End Date Taking? Authorizing Provider  Acetylcysteine 600 MG CAPS Take 1 capsule by mouth daily.    [provider]  allopurinol (ZYLOPRIM) 300 MG tablet TAKE 1 TABLET DAILY 07/26/17   Birdie Sons, MD  amoxicillin-clavulanate (AUGMENTIN) 875-125 MG tablet Take 1 tablet by mouth 2 (two) times daily for 10 days. 12/27/17 01/06/18  Laban Emperor, PA-C  arginine 500 MG tablet Take 1 tablet by mouth 2 (two) times daily.    [provider]  Cholecalciferol (VITAMIN D3) 5000 units CAPS Take 1 capsule by mouth daily.    [provider]  fexofenadine (ALLEGRA) 180 MG tablet Take 180 mg by mouth daily.    [provider]  GINSENG EXTRACT PO Take by mouth.    [provider]  HYDROcodone-acetaminophen (NORCO) 5-325 MG tablet Take 1 tablet by mouth every 4 (four) hours as needed for moderate pain. 12/27/17  Laban Emperor, PA-C  INOSITOL PO Take 600 mg by mouth daily.      [provider]  Kelp 225 MCG TABS Take 1 tablet by mouth 2 (two) times a week. Takes 2-3 times a week     [provider]  Lutein 20 MG CAPS Take 1 capsule by mouth daily.      [provider]  Multiple Vitamins-Minerals (MULTIVITAMIN WITH MINERALS) tablet Take 1 tablet by mouth daily.      [provider]  niacin 500 MG tablet Take 500 mg by mouth at bedtime.     [provider]  Probiotic CAPS Take 1 capsule by mouth daily.    [provider]  tadalafil (CIALIS) 5 MG tablet Take 1 tablet (5 mg total) by mouth daily. 11/29/17   Birdie Sons, MD  vitamin B-12  (CYANOCOBALAMIN) 250 MCG tablet Take 1 tablet by mouth daily.    [provider]  Whey Protein POWD Take 4 g by mouth once a week.     [provider]    Allergies Tetracyclines & related; Colestipol hcl; Corn-containing products; Pravastatin sodium; Shellfish allergy; Sulfa antibiotics; and Zetia [ezetimibe]  Family History  Problem Relation Age of Onset  . Hypothyroidism Mother   . Diabetes Brother   . Obesity Brother   . Cancer Brother        throat    Social History Social History   Tobacco Use  . Smoking status: Former Smoker    Packs/day: 0.75    Years: 5.00    Pack years: 3.75    Types: Cigarettes    Last attempt to quit: 11/19/1958    Years since quitting: 59.1  . Smokeless tobacco: Never Used  Substance Use Topics  . Alcohol use: Yes    Alcohol/week: 0.0 oz    Comment: 1 wine drink a month  . Drug use: No     Review of Systems  Constitutional: No fever/chills Cardiovascular: No chest pain. Respiratory: No SOB. Gastrointestinal: No abdominal pain.  No nausea, no vomiting.  Musculoskeletal: Positive for arm pain. Skin: Negative for ecchymosis.  Positive for abrasion.   ____________________________________________   PHYSICAL EXAM:  VITAL SIGNS: ED Triage Vitals [12/27/17 1046]  Enc Vitals Group     BP (!) 149/72     Pulse Rate 82     Resp 20     Temp 98 F (36.7 C)     Temp Source Oral     SpO2 97 %     Weight 198 lb (89.8 kg)     Height 5\' 8"  (1.727 m)     Head Circumference      Peak Flow      Pain Score 8     Pain Loc      Pain Edu?      Excl. in [place]?      Constitutional: Alert and oriented. Well appearing and in no acute distress. Eyes: Conjunctivae are normal. PERRL. EOMI. Head: Atraumatic. ENT:      Ears:      Nose: No congestion/rhinnorhea.      Mouth/Throat: Mucous membranes are moist.  Neck: No stridor.  Cardiovascular: Normal rate, regular rhythm.  Good peripheral circulation. Respiratory: Normal  respiratory effort without tachypnea or retractions. Lungs CTAB. Good air entry to the bases with no decreased or absent breath sounds. Musculoskeletal: Full range of motion to all extremities. No gross deformities appreciated. Neurologic:  Normal speech and language. No gross focal neurologic deficits are  appreciated.  Skin:  Skin is warm, dry.  4 cm x 4 cm skin tear to left forearm.  1/2 cm puncture to volar side of left forearm.   ____________________________________________   LABS (all labs ordered are listed, but only abnormal results are displayed)  Labs Reviewed - No data to display ____________________________________________  EKG   ____________________________________________  RADIOLOGY  No results found.  ____________________________________________    PROCEDURES  Procedure(s) performed:    Procedures    Medications  Tdap (BOOSTRIX) injection 0.5 mL (0.5 mLs Intramuscular Given 12/27/17 1149)  rabies vaccine (RABAVERT) injection 1 mL (1 mL Intramuscular Given 12/27/17 1237)  rabies immune globulin (HYPERAB/KEDRAB) injection 1,800 Units (1,800 Units Intramuscular Given 12/27/17 1224)     ____________________________________________   INITIAL IMPRESSION / ASSESSMENT AND PLAN / ED COURSE  Pertinent labs & imaging results that were available during my care of the patient were reviewed by me and considered in my medical decision making (see chart for details).  Review of the [place] CSRS was performed in accordance of the [place] prior to dispensing any controlled drugs.  Patient's diagnosis is consistent with dog bite.  Vital signs and exam are reassuring.  Wound was cleaned and dressed.  Education and risks about rabies was provided.  Rabies vaccinations were given.  Tetanus shot was updated.  Patient will be discharged home with prescriptions for Augmentin. Patient is to follow up with wound care and ED as directed. Patient is given ED precautions to return to the ED  for any worsening or new symptoms.     ____________________________________________  FINAL CLINICAL IMPRESSION(S) / ED DIAGNOSES  Final diagnoses:  Dog bite, initial encounter  Skin tear of left upper arm without complication, initial encounter      NEW MEDICATIONS STARTED DURING THIS VISIT:  ED Discharge Orders        Ordered    amoxicillin-clavulanate (AUGMENTIN) 875-125 MG tablet  2 times daily     12/27/17 1247    traMADol (ULTRAM) 50 MG tablet  Every 6 hours PRN,   Status:  Discontinued     12/27/17 1301    HYDROcodone-acetaminophen (NORCO) 5-325 MG tablet  Every 4 hours PRN     12/27/17 1307          This chart was dictated using voice recognition software/Dragon. Despite best efforts to proofread, errors can occur which can change the meaning. Any change was purely unintentional.    Laban Emperor, PA-C 12/27/17 1539    Laban Emperor, PA-C 12/27/17 1540    Lisa Roca, MD 12/28/17 [phone]

## 2017-12-30 ENCOUNTER — Other Ambulatory Visit: Payer: Self-pay

## 2017-12-30 ENCOUNTER — Encounter: Payer: Self-pay | Admitting: Emergency Medicine

## 2017-12-30 ENCOUNTER — Emergency Department
Admission: EM | Admit: 2017-12-30 | Discharge: 2017-12-30 | Disposition: A | Discharge status: Home / Self Care | Payer: Medicare Other | Attending: Emergency Medicine | Admitting: Emergency Medicine

## 2017-12-30 DIAGNOSIS — Z79899 Other long term (current) drug therapy: Secondary | ICD-10-CM | POA: Insufficient documentation

## 2017-12-30 DIAGNOSIS — Z2914 Encounter for prophylactic rabies immune globin: Secondary | ICD-10-CM | POA: Diagnosis not present

## 2017-12-30 DIAGNOSIS — Z203 Contact with and (suspected) exposure to rabies: Secondary | ICD-10-CM

## 2017-12-30 DIAGNOSIS — Z87891 Personal history of nicotine dependence: Secondary | ICD-10-CM | POA: Insufficient documentation

## 2017-12-30 MED ORDER — RABIES VACCINE, PCEC IM SUSR
1.0000 mL | Freq: Once | INTRAMUSCULAR | Status: AC
  Administered 2017-12-30: 1 mL via INTRAMUSCULAR
  Filled 2017-12-30: qty 1

## 2017-12-30 NOTE — ED Triage Notes (Signed)
Here for follow up and Rabies shot

## 2017-12-30 NOTE — ED Provider Notes (Signed)
[facility] Emergency Department Provider Note  ____________________________________________   First MD Initiated Contact with Patient 12/30/17 1153     (approximate)  I have reviewed the triage vital signs and the nursing notes.   HISTORY  Chief Complaint Rabies Injection   HPI Larry Crawford is a 77 y.o. male is here for his second rabies injection.  Patient was seen in the ED on 12/27/17 after being bit by dog.  Patient denies any difficulty with taking the antibiotic.  There are no new complaints.   Past Medical History:  Diagnosis Date  . BPH (benign prostatic hypertrophy)   . Colon polyp   . Elevated red blood cell count   . Gout   . History of chicken pox   . History of measles   . History of mumps   . IBS (irritable bowel syndrome)     Patient Active Problem List   Diagnosis Date Noted  . Hyperglycemia 12/14/2016  . Arthritis 07/16/2016  .   09/28/2015  . Alopecia 09/26/2015  . Arthralgia of right hand 09/26/2015  . BPH (benign prostatic hyperplasia) 09/26/2015  . Bursitis of left shoulder 09/26/2015  . Moderate episode of recurrent major depressive disorder ([place]) 09/26/2015  . Tinnitus 09/26/2015  . Vitamin D deficiency 09/26/2015  . 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 Elevated prostate specific antigen (PSA) 08/25/2012  . Myalgia and myositis 05/08/2010  . History of colon polyps 02/21/2010  . Breast lump 06/16/2009  . 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 IBS (irritable bowel syndrome) 03/29/2009  . Malaise and fatigue 03/29/2009  . Gout 03/15/2009  . Mixed hyperlipidemia 03/15/2009    Past Surgical History:  Procedure Laterality Date  . aortic ultrasound  2013   normal. Screening ultrasound at [facility] per pt report  . 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 BUNIONECTOMY     left  . CHOLECYSTECTOMY  1997  . COLONOSCOPY    . INGUINAL HERNIA REPAIR  [phone]   2 on right/1 on left  . LIPOMA EXCISION  2013    . NOSE SURGERY     divided septum  . POLYPECTOMY    . [place]  . SHOULDER ARTHROSCOPY  1984 and 1987   left X2 for spurs  . TONSILLECTOMY AND ADENOIDECTOMY  2002  . UMBILICAL HERNIA REPAIR      Prior to Admission medications   Medication Sig Start Date End Date Taking? Authorizing Provider  Acetylcysteine 600 MG CAPS Take 1 capsule by mouth daily.    [provider]  allopurinol (ZYLOPRIM) 300 MG tablet TAKE 1 TABLET DAILY 07/26/17   Birdie Sons, MD  amoxicillin-clavulanate (AUGMENTIN) 875-125 MG tablet Take 1 tablet by mouth 2 (two) times daily for 10 days. 12/27/17 01/06/18  Laban Emperor, PA-C  arginine 500 MG tablet Take 1 tablet by mouth 2 (two) times daily.    [provider]  Cholecalciferol (VITAMIN D3) 5000 units CAPS Take 1 capsule by mouth daily.    [provider]  fexofenadine (ALLEGRA) 180 MG tablet Take 180 mg by mouth daily.    [provider]  GINSENG EXTRACT PO Take by mouth.    [provider]  HYDROcodone-acetaminophen (NORCO) 5-325 MG tablet Take 1 tablet by mouth every 4 (four) hours as needed for moderate pain. 12/27/17   Laban Emperor, PA-C  INOSITOL PO Take 600 mg by mouth daily.      [provider]  Erick Alley  225 MCG TABS Take 1 tablet by mouth 2 (two) times a week. Takes 2-3 times a week     [provider]  Lutein 20 MG CAPS Take 1 capsule by mouth daily.      [provider]  Multiple Vitamins-Minerals (MULTIVITAMIN WITH MINERALS) tablet Take 1 tablet by mouth daily.      [provider]  niacin 500 MG tablet Take 500 mg by mouth at bedtime.     [provider]  Probiotic CAPS Take 1 capsule by mouth daily.    [provider]  tadalafil (CIALIS) 5 MG tablet Take 1 tablet (5 mg total) by mouth daily. 11/29/17   Birdie Sons, MD  vitamin B-12 (CYANOCOBALAMIN) 250 MCG tablet Take 1 tablet by mouth daily.    [provider]  Whey Protein POWD Take 4  g by mouth once a week.     [provider]    Allergies Tetracyclines & related; Colestipol hcl; Corn-containing products; Pravastatin sodium; Shellfish allergy; Sulfa antibiotics; and Zetia [ezetimibe]  Family History  Problem Relation Age of Onset  . Hypothyroidism Mother   . Diabetes Brother   . Obesity Brother   . Cancer Brother        throat    Social History Social History   Tobacco Use  . Smoking status: Former Smoker    Packs/day: 0.75    Years: 5.00    Pack years: 3.75    Types: Cigarettes    Last attempt to quit: 11/19/1958    Years since quitting: 59.1  . Smokeless tobacco: Never Used  Substance Use Topics  . Alcohol use: Yes    Alcohol/week: 0.0 oz    Comment: 1 wine drink a month  . Drug use: No    Review of Systems Constitutional: No fever/chills Eyes: No visual changes. Cardiovascular: Denies chest pain. Respiratory: Denies shortness of breath. Gastrointestinal:  No nausea, no vomiting.  Musculoskeletal: Negative for muscle skeletal pain. Skin: Positive for healing wounds. Neurological: Negative for headaches, focal weakness or numbness. ____________________________________________   PHYSICAL EXAM:  VITAL SIGNS: ED Triage Vitals  Enc Vitals Group     BP 12/30/17 1141 137/67     Pulse Rate 12/30/17 1141 73     Resp 12/30/17 1141 19     Temp 12/30/17 1141 98.9 F (37.2 C)     Temp Source 12/30/17 1141 Oral     SpO2 12/30/17 1141 100 %     Weight 12/30/17 1142 198 lb (89.8 kg)     Height 12/30/17 1142 5\' 8"  (1.727 m)     Head Circumference --      Peak Flow --      Pain Score 12/30/17 1142 4     Pain Loc --      Pain Edu? --      Excl. in [place]? --    Constitutional: Alert and oriented. Well appearing and in no acute distress. Eyes: Conjunctivae are normal.  Head: Atraumatic. Neck: No stridor.   Cardiovascular: Normal rate, regular rhythm. Grossly normal heart sounds.  Good peripheral circulation. Respiratory: Normal respiratory  effort.  No retractions. Lungs CTAB. Musculoskeletal: Moves upper and lower extremities without any difficulty normal gait was noted. Neurologic:  Normal speech and language. No gross focal neurologic deficits are appreciated. No gait instability. Skin:  Skin is warm, dry. Psychiatric: Mood and affect are normal. Speech and behavior are normal.  ____________________________________________   LABS (all labs ordered are listed, but only abnormal  results are displayed)  Labs Reviewed - No data to display  PROCEDURES  Procedure(s) performed: None  Procedures  Critical Care performed: No  ____________________________________________   INITIAL IMPRESSION / ASSESSMENT AND PLAN / ED COURSE  Patient is to continue watching dog bite for any signs of infection.  He is to clean area with mild soap and water.  He will return for his remaining injections.  Patient has a schedule to follow. ____________________________________________   FINAL CLINICAL IMPRESSION(S) / ED DIAGNOSES  Final diagnoses:  Rabies exposure     ED Discharge Orders    None       Note:  This document was prepared using Dragon voice recognition software and may include unintentional dictation errors.    Johnn Hai, PA-C 12/30/17 1328    Lavonia Drafts, MD 12/30/17 1414

## 2017-12-30 NOTE — Discharge Instructions (Signed)
Current Friday for your next rabies vaccine.  Continue to clean areas and watch for signs of infection.  Elevate your arm to reduce swelling.  Continue taking Augmentin until finished.

## 2017-12-31 ENCOUNTER — Encounter: Payer: Medicare Other | Attending: Physician Assistant | Admitting: Physician Assistant

## 2017-12-31 DIAGNOSIS — L98492 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 layer exposed: Secondary | ICD-10-CM | POA: Insufficient documentation

## 2017-12-31 DIAGNOSIS — Z87891 Personal history of nicotine dependence: Secondary | ICD-10-CM | POA: Diagnosis not present

## 2017-12-31 DIAGNOSIS — M109 Gout, unspecified: Secondary | ICD-10-CM | POA: Insufficient documentation

## 2017-12-31 DIAGNOSIS — S51852A Open bite of left forearm, initial encounter: Secondary | ICD-10-CM | POA: Diagnosis not present

## 2018-01-01 NOTE — Progress Notes (Signed)
MENDEL, BINSFELD ([id number]) Visit Report for 12/31/2017 Allergy List Details Patient Name: Larry Crawford, Larry Crawford. Date of Service: 12/31/2017 8:45 AM Medical Record Number: [id number] Patient Account Number: [id number] Date of Birth/Sex: [birth year] (77 y.o. Male) Treating RN: Montey Hora Primary Care Trayven Lumadue: Lelon Huh Other Clinician: Referring Sheryn Aldaz: Lisa Roca Treating Jennah Satchell/Extender: Melburn Hake, HOYT Weeks in Treatment: 0 Allergies Active Allergies tetracycline Septra Shellfish Containing Products Sulfa (Sulfonamide Antibiotics) colestipol Zetia Allergy Notes Electronic Signature(s) Signed: 12/31/2017 4:06:53 PM By: Montey Hora Entered By: Montey Hora on 12/31/2017 09:00:56 [name] ([id number]) -------------------------------------------------------------------------------- Arrival Information Details Patient Name: Larry Crawford, Larry Crawford. Date of Service: 12/31/2017 8:45 AM Medical Record Number: [id number] Patient Account Number: [id number] Date of Birth/Sex: Apr 04, 1941 (77 y.o. Male) Treating RN: Montey Hora Primary Care Madelon Welsch: Lelon Huh Other Clinician: Referring Kyarah Enamorado: Lisa Roca Treating Lyal Husted/Extender: Worthy Keeler Weeks in Treatment: 0 Visit Information Patient Arrived: Ambulatory Arrival Time: 08:58 Accompanied By: self Transfer Assistance: None Patient Identification Verified: Yes Secondary Verification Process Completed: Yes Electronic Signature(s) Signed: 12/31/2017 4:06:53 PM By: Montey Hora Entered By: Montey Hora on 12/31/2017 08:58:55 [name] ([id number]) -------------------------------------------------------------------------------- Clinic Level of Care Assessment Details Patient Name: Larry Crawford, Larry Crawford. Date of Service: 12/31/2017 8:45 AM Medical Record Number: [id number] Patient Account Number: [id number] Date of Birth/Sex: 11/21/1940 (77 y.o. Male) Treating RN: Montey Hora Primary Care  Wisdom Seybold: Lelon Huh Other Clinician: Referring Maizie Garno: Lisa Roca Treating Loralye Loberg/Extender: Melburn Hake, HOYT Weeks in Treatment: 0 Clinic Level of Care Assessment Items TOOL 1 Quantity Score '[]'  - Use when EandM and Procedure is performed on INITIAL visit 0 ASSESSMENTS - Nursing Assessment / Reassessment X - General Physical Exam (combine w/ comprehensive assessment (listed just below) when 1 20 performed on new pt. evals) X- 1 25 Comprehensive Assessment (HX, ROS, Risk Assessments, Wounds Hx, etc.) ASSESSMENTS - Wound and Skin Assessment / Reassessment '[]'  - Dermatologic / Skin Assessment (not related to wound area) 0 ASSESSMENTS - Ostomy and/or Continence Assessment and Care '[]'  - Incontinence Assessment and Management 0 '[]'  - 0 Ostomy Care Assessment and Management (repouching, etc.) PROCESS - Coordination of Care X - Simple Patient / Family Education for ongoing care 1 15 '[]'  - 0 Complex (extensive) Patient / Family Education for ongoing care X- 1 10 Staff obtains Programmer, systems, Records, Test Results / Process Orders '[]'  - 0 Staff telephones HHA, Nursing Homes / Clarify orders / etc '[]'  - 0 Routine Transfer to another Facility (non-emergent condition) '[]'  - 0 Routine Hospital Admission (non-emergent condition) X- 1 15 New Admissions / Biomedical engineer / Ordering NPWT, Apligraf, etc. '[]'  - 0 Emergency Hospital Admission (emergent condition) PROCESS - Special Needs '[]'  - Pediatric / Minor Patient Management 0 '[]'  - 0 Isolation Patient Management '[]'  - 0 Hearing / Language / Visual special needs '[]'  - 0 Assessment of Community assistance (transportation, D/C planning, etc.) '[]'  - 0 Additional assistance / Altered mentation '[]'  - 0 Support Surface(s) Assessment (bed, cushion, seat, etc.) SHAMOND, SKELTON ([id number]) INTERVENTIONS - Miscellaneous '[]'  - External ear exam 0 '[]'  - 0 Patient Transfer (multiple staff / Civil Service fast streamer / Similar devices) '[]'  - 0 Simple Staple /  Suture removal (25 or less) '[]'  - 0 Complex Staple / Suture removal (26 or more) '[]'  - 0 Hypo/Hyperglycemic Management (do not check if billed separately) '[]'  - 0 Ankle / Brachial Index (ABI) - do not check if billed separately Has the patient been seen at the hospital within the last three years: Yes Total Score: 85 Level  Of Care: New/Established - Level 3 Electronic Signature(s) Signed: 12/31/2017 4:06:53 PM By: Montey Hora Entered By: Montey Hora on 12/31/2017 09:29:29 MOROCCO, GIPE ([id number]) -------------------------------------------------------------------------------- Encounter Discharge Information Details Patient Name: Larry Crawford, Larry Crawford. Date of Service: 12/31/2017 8:45 AM Medical Record Number: [id number] Patient Account Number: [id number] Date of Birth/Sex: 25-Nov-1940 (77 y.o. Male) Treating RN: Montey Hora Primary Care Cacey Willow: Lelon Huh Other Clinician: Referring Daymien Goth: Lisa Roca Treating Ajahni Nay/Extender: Melburn Hake, HOYT Weeks in Treatment: 0 Encounter Discharge Information Items Discharge Pain Level: 0 Discharge Condition: Stable Ambulatory Status: Ambulatory Discharge Destination: Home Transportation: Private Auto Accompanied By: self Schedule Follow-up Appointment: Yes Medication Reconciliation completed and No provided to Patient/Care Yaakov Saindon: Provided on Clinical Summary of Care: 12/31/2017 Form Type Recipient Paper Patient RB Electronic Signature(s) Signed: 12/31/2017 10:26:53 AM By: Montey Hora Entered By: Montey Hora on 12/31/2017 10:26:52 [name] ([id number]) -------------------------------------------------------------------------------- Lower Extremity Assessment Details Patient Name: Larry Crawford, Larry Crawford. Date of Service: 12/31/2017 8:45 AM Medical Record Number: [id number] Patient Account Number: [id number] Date of Birth/Sex: 1940-12-19 (77 y.o. Male) Treating RN: Montey Hora Primary Care Edras Wilford: Lelon Huh Other Clinician: Referring Tye Juarez: Lisa Roca Treating Bobby Ragan/Extender: Melburn Hake, HOYT Weeks in Treatment: 0 Electronic Signature(s) Signed: 12/31/2017 4:06:53 PM By: Montey Hora Entered By: Montey Hora on 12/31/2017 09:18:20 [name] ([id number]) -------------------------------------------------------------------------------- Multi Wound Chart Details Patient Name: Larry Crawford, Larry Crawford. Date of Service: 12/31/2017 8:45 AM Medical Record Number: [id number] Patient Account Number: [id number] Date of Birth/Sex: 1941/01/20 (77 y.o. Male) Treating RN: Montey Hora Primary Care Shahira Fiske: Lelon Huh Other Clinician: Referring Izabellah Dadisman: Lisa Roca Treating Nalin Mazzocco/Extender: Melburn Hake, HOYT Weeks in Treatment: 0 Vital Signs Height(in): 68 Pulse(bpm): 70 Weight(lbs): 198 Blood Pressure(mmHg): 129/56 Body Mass Index(BMI): 30 Temperature(F): 97.7 Respiratory Rate 18 (breaths/min): Photos: [1:No Photos] [N/A:N/A] Wound Location: [1:Left Forearm] [N/A:N/A] Wounding Event: [1:Bite] [N/A:N/A] Primary Etiology: [1:Trauma, Other] [N/A:N/A] Comorbid History: [1:Gout, Osteoarthritis] [N/A:N/A] Date Acquired: [1:12/27/2017] [N/A:N/A] Weeks of Treatment: [1:0] [N/A:N/A] Wound Status: [1:Open] [N/A:N/A] Measurements L x W x D [1:4.9x4.3x0.1] [N/A:N/A] (cm) Area (cm) : [1:16.548] [N/A:N/A] Volume (cm) : [1:1.655] [N/A:N/A] Classification: [1:Full Thickness Without Exposed Support Structures] [N/A:N/A] Exudate Amount: [1:Large] [N/A:N/A] Exudate Type: [1:Serosanguineous] [N/A:N/A] Exudate Color: [1:red, brown] [N/A:N/A] Wound Margin: [1:Distinct, outline attached] [N/A:N/A] Granulation Amount: [1:Large (67-100%)] [N/A:N/A] Granulation Quality: [1:Red] [N/A:N/A] Necrotic Amount: [1:Small (1-33%)] [N/A:N/A] Necrotic Tissue: [1:Eschar, Adherent Slough] [N/A:N/A] Exposed Structures: [1:Fascia: No Fat Layer (Subcutaneous Tissue) Exposed: No Tendon: No Muscle:  No Joint: No Bone: No] [N/A:N/A] Epithelialization: [1:None] [N/A:N/A] Periwound Skin Texture: [1:Excoriation: No Induration: No Callus: No Crepitus: No Rash: No Scarring: No] [N/A:N/A] Periwound Skin Moisture: [N/A:N/A] Maceration: Yes Dry/Scaly: No Periwound Skin Color: Erythema: Yes N/A N/A Atrophie Blanche: No Cyanosis: No Ecchymosis: No Hemosiderin Staining: No Mottled: No Pallor: No Rubor: No Erythema Location: Circumferential N/A N/A Temperature: No Abnormality N/A N/A Tenderness on Palpation: Yes N/A N/A Wound Preparation: Ulcer Cleansing: N/A N/A Rinsed/Irrigated with Saline Topical Anesthetic Applied: Other: lidocaine 4% Treatment Notes Electronic Signature(s) Signed: 12/31/2017 4:06:53 PM By: Montey Hora Entered By: Montey Hora on 12/31/2017 09:27:32 Larry Crawford, Larry Crawford ([id number]) -------------------------------------------------------------------------------- [place] Details Patient Name: Larry Crawford, Larry Crawford. Date of Service: 12/31/2017 8:45 AM Medical Record Number: [id number] Patient Account Number: [id number] Date of Birth/Sex: November 28, 1940 (77 y.o. Male) Treating RN: Montey Hora Primary Care Rafay Dahan: Lelon Huh Other Clinician: Referring Korri Ask: Lisa Roca Treating Chenise Mulvihill/Extender: Melburn Hake, HOYT Weeks in Treatment: 0 Active Inactive ` Orientation to the Wound Care Program Nursing Diagnoses: Knowledge deficit related to the wound healing  center program Goals: Patient/caregiver will verbalize understanding of the [place] Date Initiated: 12/31/2017 Target Resolution Date: 03/29/2018 Goal Status: Active Interventions: Provide education on orientation to the wound center Notes: ` Pain, Acute or Chronic Nursing Diagnoses: Pain, acute or chronic: actual or potential Goals: Patient/caregiver will verbalize comfort level met Date Initiated: 12/31/2017 Target Resolution Date: 03/29/2018 Goal Status:  Active Interventions: Complete pain assessment as per visit requirements Notes: ` Wound/Skin Impairment Nursing Diagnoses: Impaired tissue integrity Goals: Ulcer/skin breakdown will heal within 14 weeks Date Initiated: 12/31/2017 Target Resolution Date: 03/29/2018 Goal Status: Active Interventions: Larry Crawford, Larry Crawford ([id number]) Assess patient/caregiver ability to obtain necessary supplies Assess patient/caregiver ability to perform ulcer/skin care regimen upon admission and as needed Assess ulceration(s) every visit Notes: Electronic Signature(s) Signed: 12/31/2017 4:06:53 PM By: Montey Hora Entered By: Montey Hora on 12/31/2017 09:27:20 [name] ([id number]) -------------------------------------------------------------------------------- Pain Assessment Details Patient Name: Larry Crawford, Larry Crawford. Date of Service: 12/31/2017 8:45 AM Medical Record Number: [id number] Patient Account Number: [id number] Date of Birth/Sex: 12-Dec-1940 (77 y.o. Male) Treating RN: Montey Hora Primary Care Tyris Eliot: Lelon Huh Other Clinician: Referring [place] Mullett: Lisa Roca Treating Aston Lawhorn/Extender: Melburn Hake, HOYT Weeks in Treatment: 0 Active Problems Location of Pain Severity and Description of Pain Patient Has Paino Yes Site Locations Pain Location: Pain in Ulcers With Dressing Change: Yes Duration of the Pain. Constant / Intermittento Constant Pain Management and Medication Current Pain Management: Electronic Signature(s) Signed: 12/31/2017 4:06:53 PM By: Montey Hora Entered By: Montey Hora on 12/31/2017 08:59:15 [name] ([id number]) -------------------------------------------------------------------------------- Patient/Caregiver Education Details Patient Name: Larry Crawford, Larry Crawford. Date of Service: 12/31/2017 8:45 AM Medical Record Number: [id number] Patient Account Number: [id number] Date of Birth/Gender: 1941/05/17 (77 y.o. Male) Treating RN: Montey Hora Primary Care Physician: Lelon Huh Other Clinician: Referring Physician: Lisa Roca Treating Physician/Extender: Sharalyn Ink in Treatment: 0 Education Assessment Education Provided To: Patient Education Topics Provided Wound/Skin Impairment: Handouts: Other: wound care as ordered and reportable s/s Methods: Demonstration, Explain/Verbal Responses: State content correctly Electronic Signature(s) Signed: 12/31/2017 4:06:53 PM By: Montey Hora Entered By: Montey Hora on 12/31/2017 10:27:14 [name] ([id number]) -------------------------------------------------------------------------------- Wound Assessment Details Patient Name: Larry Crawford, Larry Crawford. Date of Service: 12/31/2017 8:45 AM Medical Record Number: [id number] Patient Account Number: [id number] Date of Birth/Sex: 04-Aug-1941 (77 y.o. Male) Treating RN: Montey Hora Primary Care Syerra Abdelrahman: Lelon Huh Other Clinician: Referring Jakaiya Netherland: Lisa Roca Treating Sloan Galentine/Extender: Melburn Hake, HOYT Weeks in Treatment: 0 Wound Status Wound Number: 1 Primary Etiology: Trauma, Other Wound Location: Left Forearm Wound Status: Open Wounding Event: Bite Comorbid History: Gout, Osteoarthritis Date Acquired: 12/27/2017 Weeks Of Treatment: 0 Clustered Wound: No Photos Photo Uploaded By: Montey Hora on 12/31/2017 13:05:16 Wound Measurements Length: (cm) 4.9 Width: (cm) 4.3 Depth: (cm) 0.1 Area: (cm) 16.548 Volume: (cm) 1.655 % Reduction in Area: % Reduction in Volume: Epithelialization: None Tunneling: No Undermining: No Wound Description Full Thickness Without Exposed Support Foul O Classification: Structures Slough Wound Margin: Distinct, outline attached Exudate Large Amount: Exudate Type: Serosanguineous Exudate Color: red, brown dor After Cleansing: No /Fibrino Yes Wound Bed Granulation Amount: Large (67-100%) Exposed Structure Granulation Quality: Red Fascia Exposed:  No Necrotic Amount: Small (1-33%) Fat Layer (Subcutaneous Tissue) Exposed: No Necrotic Quality: Eschar, Adherent Slough Tendon Exposed: No Muscle Exposed: No Joint Exposed: No Bone Exposed: No Larry Crawford, Larry Crawford. ([id number]) Periwound Skin Texture Texture Color No Abnormalities Noted: No No Abnormalities Noted: No Callus: No Atrophie Blanche: No Crepitus: No Cyanosis: No Excoriation: No Ecchymosis: No Induration: No  Erythema: Yes Rash: No Erythema Location: Circumferential Scarring: No Hemosiderin Staining: No Mottled: No Moisture Pallor: No No Abnormalities Noted: No Rubor: No Dry / Scaly: No Maceration: Yes Temperature / Pain Temperature: No Abnormality Tenderness on Palpation: Yes Wound Preparation Ulcer Cleansing: Rinsed/Irrigated with Saline Topical Anesthetic Applied: Other: lidocaine 4%, Treatment Notes Wound #1 (Left Forearm) 1. Cleansed with: Clean wound with Normal Saline 2. Anesthetic Topical Lidocaine 4% cream to wound bed prior to debridement 4. Dressing Applied: Other dressing (specify in notes) 5. Secondary Dressing Applied Kerlix/Conform Non-Adherent pad 7. Secured with Tape Notes silvercel Electronic Signature(s) Signed: 12/31/2017 4:06:53 PM By: Montey Hora Entered By: Montey Hora on 12/31/2017 09:20:35 FREDDRICK, GLADSON ([id number]) -------------------------------------------------------------------------------- [place] Details Patient Name: SATURNINO, LIEW. Date of Service: 12/31/2017 8:45 AM Medical Record Number: [id number] Patient Account Number: [id number] Date of Birth/Sex: 12-22-1940 (77 y.o. Male) Treating RN: Montey Hora Primary Care Tyrena Gohr: Lelon Huh Other Clinician: Referring Lashone Stauber: Lisa Roca Treating Kathleen Likins/Extender: Melburn Hake, HOYT Weeks in Treatment: 0 Vital Signs Time Taken: 09:03 Temperature (F): 97.7 Height (in): 68 Pulse (bpm): 70 Source: Measured Respiratory Rate (breaths/min):  18 Weight (lbs): 198 Blood Pressure (mmHg): 129/56 Source: Measured Reference Range: 80 - 120 mg / dl Body Mass Index (BMI): 30.1 Electronic Signature(s) Signed: 12/31/2017 4:06:53 PM By: Montey Hora Entered By: Montey Hora on 12/31/2017 09:05:57

## 2018-01-01 NOTE — Progress Notes (Signed)
Larry Crawford, Larry Crawford ([id number]) Visit Report for 12/31/2017 Chief Complaint Document Details Patient Name: Larry Crawford, Larry Crawford. Date of Service: 12/31/2017 8:45 AM Medical Record Number: [id number] Patient Account Number: [id number] Date of Birth/Sex: 25-Apr-1941 (77 y.o. Male) Treating RN: Larry Crawford Primary Care Provider: Lelon Crawford Other Clinician: Referring Provider: Lisa Crawford Treating Provider/Extender: Larry Crawford Weeks in Treatment: 0 Information Obtained from: Patient Chief Complaint Left arm dog bite 12/27/17 Electronic Signature(s) Signed: 12/31/2017 5:22:42 PM By: Larry Keeler PA-C Entered By: Larry Crawford on 12/31/2017 09:59:11 Larry Crawford, Larry Crawford ([id number]) -------------------------------------------------------------------------------- Debridement Details Patient Name: Larry Crawford, Larry Crawford. Date of Service: 12/31/2017 8:45 AM Medical Record Number: [id number] Patient Account Number: [id number] Date of Birth/Sex: [birth year] (77 y.o. Male) Treating RN: Larry Crawford Primary Care Provider: Lelon Crawford Other Clinician: Referring Provider: Lisa Crawford Treating Provider/Extender: Larry Crawford, Larry Crawford Weeks in Treatment: 0 Debridement Performed for Wound #1 Left Forearm Assessment: Performed By: Physician Larry III, Larry Crawford E., PA-C Debridement: Open Wound/Selective Debridement Description: Selective Pre-procedure Verification/Time Yes - 09:28 Out Taken: Start Time: 09:28 Pain Control: Lidocaine 4% Topical Solution Level: Skin/Dermis Total Area Debrided (L x W): 3.5 (cm) x 1 (cm) = 3.5 (cm) Tissue and other material Non-Viable, Skin debrided: Instrument: Forceps, Scissors Bleeding: None End Time: 09:33 Procedural Pain: 0 Post Procedural Pain: 0 Response to Treatment: Procedure was tolerated well Post Debridement Measurements of Total Wound Length: (cm) 4.9 Width: (cm) 4.3 Depth: (cm) 0.1 Volume: (cm) 1.655 Character of Wound/Ulcer Post Debridement:  Improved Post Procedure Diagnosis Same as Pre-procedure Electronic Signature(s) Signed: 12/31/2017 4:06:53 PM By: Larry Crawford Signed: 12/31/2017 5:22:42 PM By: Larry Keeler PA-C Entered By: Larry Crawford on 12/31/2017 09:32:34 Larry Crawford, Larry Crawford ([id number]) -------------------------------------------------------------------------------- HPI Details Patient Name: Larry Crawford, Larry Crawford. Date of Service: 12/31/2017 8:45 AM Medical Record Number: [id number] Patient Account Number: [id number] Date of Birth/Sex: 05-18-1941 (77 y.o. Male) Treating RN: Larry Crawford Primary Care Provider: Lelon Crawford Other Clinician: Referring Provider: Lisa Crawford Treating Provider/Extender: Larry Crawford Weeks in Treatment: 0 History of Present Illness HPI Description: 12/31/17 on evaluation today patient arrives in our clinic for initial evaluation concerning a dog bite to the left forearm which occurred this past Friday 12/27/17. He states he was in the park when a large dog ran up to him and bit him on the forearm through his coat. This caused a fairly substantial skin tear as well is a puncture wound on the lateral aspect. The dog as well as the owner then ran off and he was unable to catch the individual to obtain any information. He was seen in the ER that day and they subsequently placed him on antibiotic therapy, Augmentin, provided a tetanus booster since the patient was not sure of his last booster, and also initiated rabies vaccinations which he has been undergoing. This was due to the fact that they were assuming the dog did not have its proper vaccinations. Since the owner ran off. Patient/hemoglobin A1c was 5.7 and this was in February 2018 he has not had this check this year. He has no diagnosis of formal diabetes. Patient has no other major medical problems that would significantly impact his healing ability. Currently just a telfa pad was placed on the wound and this was covered with a  dressing until he came in to see [place] today. Electronic Signature(s) Signed: 12/31/2017 5:22:42 PM By: Larry Keeler PA-C Entered By: Larry Crawford on 12/31/2017 10:04:21 Larry Crawford ([id number]) -------------------------------------------------------------------------------- Physical Exam Details Patient Name: Larry Crawford, Larry  M. Date of Service: 12/31/2017 8:45 AM Medical Record Number: [id number] Patient Account Number: [id number] Date of Birth/Sex: August 14, 1941 (77 y.o. Male) Treating RN: Larry Crawford Primary Care Provider: Lelon Crawford Other Clinician: Referring Provider: Lisa Crawford Treating Provider/Extender: Larry Crawford, Larry Crawford Weeks in Treatment: 0 Constitutional sitting or standing blood pressure is within target range for patient.. pulse regular and within target range for patient.Marland Kitchen respirations regular, non-labored and within target range for patient.Marland Kitchen temperature within target range for patient.. Well- nourished and well-hydrated in no acute distress. Eyes conjunctiva clear no eyelid edema noted. pupils equal round and reactive to light and accommodation. Ears, Nose, Mouth, and Throat no gross abnormality of ear auricles or external auditory canals. normal hearing noted during conversation. mucus membranes moist. Respiratory normal breathing without difficulty. clear to auscultation bilaterally. Cardiovascular regular rate and rhythm with normal S1, S2. 2+ Radial pulses bilaterally. no clubbing, cyanosis, significant edema, <3 sec cap refill. Gastrointestinal (GI) soft, non-tender, non-distended, +BS. no ventral hernia noted. Musculoskeletal normal gait and posture. no significant deformity or arthritic changes, no loss or range of motion, no clubbing. Psychiatric this patient is able to make decisions and demonstrates good insight into disease process. Alert and Oriented x 3. pleasant and cooperative. Notes Patient's wound appear to be fairly clean although there  was a film on this I was able to clean it off carefully using saline and gauze. With that being said I did use four steps and scissors to trim away some of the dead tissue from the periphery of the wound due to the fact that this would obviously impede healing and was necrotic/dead tissue anyway. Post flight debridement this appeared to be doing much better no sharp surgical debridement was required. Electronic Signature(s) Signed: 12/31/2017 5:22:42 PM By: Larry Keeler PA-C Entered By: Larry Crawford on 12/31/2017 10:05:55 Larry Crawford, Larry Crawford ([id number]) -------------------------------------------------------------------------------- Physician Orders Details Patient Name: Larry Crawford, Larry Crawford. Date of Service: 12/31/2017 8:45 AM Medical Record Number: [id number] Patient Account Number: [id number] Date of Birth/Sex: [birth year] (77 y.o. Male) Treating RN: Larry Crawford Primary Care Provider: Lelon Crawford Other Clinician: Referring Provider: Lisa Crawford Treating Provider/Extender: Larry Crawford, Larry Crawford Weeks in Treatment: 0 Verbal / Phone Orders: No Diagnosis Coding ICD-10 Coding Code Description W54.0XXA Bitten by dog, initial encounter [phone] 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 layer exposed Wound Cleansing Wound #1 Left Forearm o Clean wound with Normal Saline. o May Shower, gently pat wound dry prior to applying new dressing. Anesthetic (add to Medication List) Wound #1 Left Forearm o Topical Lidocaine 4% cream applied to wound bed prior to debridement (In Clinic Only). Primary Wound Dressing Wound #1 Left Forearm o Silvercel Non-Adherent Secondary Dressing Wound #1 Left Forearm o Conform/Kerlix o Non-adherent pad Dressing Change Frequency Wound #1 Left Forearm o Change dressing every other day. Follow-up Appointments Wound #1 Left Forearm o Return Appointment in 1 week. Additional Orders / Instructions Wound #1 Left Forearm o  Vitamin A; Vitamin C, Zinc - Please add these over the counter supplements for wound healing o Increase protein intake. o Activity as tolerated Electronic Signature(s) Signed: 12/31/2017 4:06:53 PM By: Larry Crawford Signed: 12/31/2017 5:22:42 PM By: Romeo Rabon, [place] ([id number]) Entered By: Larry Crawford on 12/31/2017 09:33:54 RANDEL, HARGENS ([id number]) -------------------------------------------------------------------------------- Problem List Details Patient Name: Larry Crawford, Larry Crawford. Date of Service: 12/31/2017 8:45 AM Medical Record Number: [id number] Patient Account Number: [id number] Date of Birth/Sex: 31-May-1941 (77 y.o. 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 Date of Service: 12/31/2017 8:45 AM Medical Record Number: [id number] Patient Account Number: [id number] Date of Birth/Sex: Dec 26, 1940 (77 y.o. Male) Treating RN: Larry Crawford Primary Care Provider: Lelon Crawford Other Clinician: Referring Provider: Lisa Crawford Treating Provider/Extender: Larry Crawford Weeks in Treatment: 0 Subjective Chief Complaint Information obtained from Patient Left arm dog bite 12/27/17 History of Present Illness (HPI) 12/31/17 on evaluation today patient arrives in  our clinic for initial evaluation concerning a dog bite to the left forearm which occurred this past Friday 12/27/17. He states he was in the park when a large dog ran up to him and bit him on the forearm through his coat. This caused a fairly substantial skin tear as well is a puncture wound on the lateral aspect. The dog as well as the owner then ran off and he was unable to catch the individual to obtain any information. He was seen in the ER that day and they subsequently placed him on antibiotic therapy, Augmentin, provided a tetanus booster since the patient was not sure of his last booster, and also initiated rabies vaccinations which he has been undergoing. This was due to the fact that they were assuming the dog did not have its proper vaccinations. Since the owner ran off. Patient/hemoglobin A1c was 5.7 and this was in February 2018 he has not had this check this year. He has no diagnosis of formal diabetes. Patient has no other major medical problems that would significantly impact his healing ability. Currently just a telfa pad was placed on the wound and this was covered with a dressing until he came in to see [place] today. Wound History Patient presents with 1 open wound that has been present for approximately 5 days. Patient has been treating wound in the following manner: dressing from ER. Laboratory tests have not been performed in the last month. Patient reportedly has not tested positive for an antibiotic resistant organism. Patient reportedly has not tested positive for osteomyelitis. Patient reportedly has not had testing performed to evaluate circulation in the legs. Patient History Information obtained from Patient. Allergies tetracycline, Septra, Shellfish Containing Products, Sulfa (Sulfonamide Antibiotics), colestipol, Zetia Family History Cancer - Mother,Siblings, Thyroid Problems - Siblings, No family history of Diabetes, Heart Disease, Hereditary Spherocytosis,  Hypertension, Kidney Disease, Lung Disease, Seizures, Stroke, Tuberculosis. Social History Former smoker - quit early 49s, Marital Status - Married, Alcohol Use - Rarely, Drug Use - No History, Caffeine Use - Daily. Medical History Eyes Denies history of Cataracts, Glaucoma, Optic Neuritis Musculoskeletal Patient has history of Gout, Osteoarthritis Oncologic Denies history of Received Chemotherapy, Received [place] ([id number]) Review of Systems (ROS) Constitutional Symptoms ([place]) The patient has no complaints or symptoms. Eyes Complains or has symptoms of Glasses / Contacts. Ear/Nose/Mouth/Throat The patient has no complaints or symptoms, HOH tinnitis Hematologic/Lymphatic elevated red blood cell count vit d deficiency Respiratory The patient has no complaints or symptoms. Cardiovascular hyperlipidemia Gastrointestinal ibs hx colon polyps Endocrine The patient has no complaints or symptoms. Genitourinary  erectile dysfunction bph hypergonadism elevated psa Immunological The patient has no complaints or symptoms. Integumentary (Skin) dog bite alopecia Musculoskeletal bursitis Oncologic skin cancer Psychiatric Complains or has symptoms of Anxiety. Objective Constitutional sitting or standing blood pressure is within target range for patient.. pulse regular and within target range for patient.Marland Kitchen respirations regular, non-labored and within target range for patient.Marland Kitchen temperature within target range for patient.. Well- nourished and well-hydrated in no acute distress. Vitals Time Taken: 9:03 AM, Height: 68 in, Source: Measured, Weight: 198 lbs, Source: Measured, BMI: 30.1, Temperature: 97.7 F, Pulse: 70 bpm, Respiratory Rate: 18 breaths/min, Blood Pressure: 129/56 mmHg. Eyes conjunctiva clear no eyelid edema noted. pupils equal round and reactive to light and accommodation. Ears, Nose, Mouth, and Throat no gross abnormality of ear auricles  or external auditory canals. normal hearing noted during conversation. mucus membranes moist. Respiratory normal breathing without difficulty. clear to auscultation bilaterally. ALLEN, [place]ERTON. ([id number]) Cardiovascular regular rate and rhythm with normal S1, S2. 2+ Radial pulses bilaterally. no clubbing, cyanosis, significant edema, Gastrointestinal (GI) soft, non-tender, non-distended, +BS. no ventral hernia noted. Musculoskeletal normal gait and posture. no significant deformity or arthritic changes, no loss or range of motion, no clubbing. Psychiatric this patient is able to make decisions and demonstrates good insight into disease process. Alert and Oriented x 3. pleasant and cooperative. General Notes: Patient's wound appear to be fairly clean although there was a film on this I was able to clean it off carefully using saline and gauze. With that being said I did use four steps and scissors to trim away some of the dead tissue from the periphery of the wound due to the fact that this would obviously impede healing and was necrotic/dead tissue anyway. Post flight debridement this appeared to be doing much better no sharp surgical debridement was required. Integumentary (Hair, Skin) Wound #1 status is Open. Original cause of wound was Bite. The wound is located on the Left Forearm. The wound measures 4.9cm length x 4.3cm width x 0.1cm depth; 16.548cm^2 area and 1.655cm^3 volume. There is no tunneling or undermining noted. There is a large amount of serosanguineous drainage noted. The wound margin is distinct with the outline attached to the wound base. There is large (67-100%) red granulation within the wound bed. There is a small (1-33%) amount of necrotic tissue within the wound bed including Eschar and Adherent Slough. The periwound skin appearance exhibited: Maceration, Erythema. The periwound skin appearance did not exhibit: Callus, Crepitus, Excoriation, Induration,  Rash, Scarring, Dry/Scaly, Atrophie Blanche, Cyanosis, Ecchymosis, Hemosiderin Staining, Mottled, Pallor, Rubor. The surrounding wound skin color is noted with erythema which is circumferential. Periwound temperature was noted as No Abnormality. The periwound has tenderness on palpation. Assessment Active Problems ICD-10 W54.0XXA - Bitten by dog, initial encounter [phone] - 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 layer exposed Procedures Wound #1 Pre-procedure diagnosis of Wound #1 is a Trauma, Other located on the Left Forearm . There was a Skin/Dermis Open Wound/Selective [id number]) debridement with total area of 3.5 sq cm performed by Larry III, Larry Crawford E., PA-C. with the following instrument(s): Forceps and Scissors to remove Non-Viable tissue/material including Skin after achieving pain control using Lidocaine 4% Topical Solution. A time out was conducted at 09:28, prior to the start of the procedure. There was no bleeding. The procedure was tolerated well with a pain level of 0 throughout and a pain level of 0 following the procedure. Post Debridement Measurements: 4.9cm length x 4.3cm width x 0.1cm  depth; 1.655cm^3 volume. Character of Wound/Ulcer Post Debridement is improved. Post procedure Diagnosis Wound #1: Same as Pre-Procedure Larry Crawford, Larry Crawford. ([id number]) Plan Wound Cleansing: Wound #1 Left Forearm: Clean wound with Normal Saline. May Shower, gently pat wound dry prior to applying new dressing. Anesthetic (add to Medication List): Wound #1 Left Forearm: Topical Lidocaine 4% cream applied to wound bed prior to debridement (In Clinic Only). Primary Wound Dressing: Wound #1 Left Forearm: Silvercel Non-Adherent Secondary Dressing: Wound #1 Left Forearm: Conform/Kerlix Non-adherent pad Dressing Change Frequency: Wound #1 Left Forearm: Change dressing every other day. Follow-up Appointments: Wound #1 Left Forearm: Return Appointment in 1  week. Additional Orders / Instructions: Wound #1 Left Forearm: Vitamin A; Vitamin C, Zinc - Please add these over the counter supplements for wound healing Increase protein intake. Activity as tolerated At this point I am going to recommend that we initiate treatment with the nonadherent silver alginate dressing to help with fluid management, maintaining and moist wound bed, and helping to prevent excess bacterial burden. Hopefully this will help this wound to heal rather rapidly. With that being said we will see were things stand in one weeks time we see him for reevaluation. I did recommend that the patient continue with the rabies vaccination series obviously and also that he continue with his antibiotic, Augmentin, till completion. He's in agreement with this plan. Please see above for specific wound care orders. We will see patient for re-evaluation in 1 week(s) here in the clinic. If anything worsens or changes patient will contact our office for additional recommendations. Electronic Signature(s) Signed: 12/31/2017 5:22:42 PM By: Larry Keeler PA-C Entered By: Larry Crawford on 12/31/2017 10:07:00 Larry Crawford, THIBEAU ([id number]) -------------------------------------------------------------------------------- ROS/PFSH Details Patient Name: AYOUB, AREY. Date of Service: 12/31/2017 8:45 AM Medical Record Number: [id number] Patient Account Number: [id number] Date of Birth/Sex: [birth year] (77 y.o. Male) Treating RN: Larry Crawford Primary Care Provider: Lelon Crawford Other Clinician: Referring Provider: Lisa Crawford Treating Provider/Extender: Larry Crawford, Larry Crawford Weeks in Treatment: 0 Information Obtained From Patient Wound History Do you currently have one or more open woundso Yes How many open wounds do you currently haveo 1 Approximately how long have you had your woundso 5 days How have you been treating your wound(s) until nowo dressing from ER Has your wound(s) ever healed  and then re-openedo No Have you had any lab work done in the past montho No Have you tested positive for an antibiotic resistant organism (MRSA, VRE)o No Have you tested positive for osteomyelitis (bone infection)o No Have you had any tests for circulation on your legso No Eyes Complaints and Symptoms: Positive for: Glasses / Contacts Medical History: Negative for: Cataracts; Glaucoma; Optic Neuritis Psychiatric Complaints and Symptoms: Positive for: Anxiety Constitutional Symptoms (General Health) Complaints and Symptoms: No Complaints or Symptoms Ear/Nose/Mouth/Throat Complaints and Symptoms: No Complaints or Symptoms Complaints and Symptoms: Review of System Notes: HOH tinnitis Hematologic/Lymphatic Complaints and Symptoms: Review of System Notes: elevated red blood cell count vit d deficiency Respiratory MCKINNON, GLICK ([id number]) Complaints and Symptoms: No Complaints or Symptoms Cardiovascular Complaints and Symptoms: Review of System Notes: hyperlipidemia Gastrointestinal Complaints and Symptoms: Review of System Notes: ibs hx colon polyps Endocrine Complaints and Symptoms: No Complaints or Symptoms Genitourinary Complaints and Symptoms: Review of System Notes: erectile dysfunction bph hypergonadism elevated psa Immunological Complaints and Symptoms: No Complaints or Symptoms Integumentary (Skin) Complaints and Symptoms: Review of System Notes: dog bite alopecia Musculoskeletal Complaints and Symptoms: Review of System Notes: bursitis Medical History: Positive  for: Gout; Osteoarthritis Oncologic Complaints and Symptoms: Review of System Notes: skin cancer Medical History: Negative for: Received Chemotherapy; Received Radiation NICHOLAUS, STEINKE ([id number]) Immunizations Pneumococcal Vaccine: Received Pneumococcal Vaccination: Yes Implantable Devices Family and Social History Cancer: Yes - Mother,Siblings; Diabetes: No; Heart  Disease: No; Hereditary Spherocytosis: No; Hypertension: No; Kidney Disease: No; Lung Disease: No; Seizures: No; Stroke: No; Thyroid Problems: Yes - Siblings; Tuberculosis: No; Former smoker - quit early 54s; Marital Status - Married; Alcohol Use: Rarely; Drug Use: No History; Caffeine Use: Daily; Financial Concerns: No; Food, Clothing or Shelter Needs: No; Support System Lacking: No; Transportation Concerns: No; Advanced Directives: No; Patient does not want information on Advanced Directives; Do not resuscitate: No; Living Will: No; Medical Power of Attorney: No Electronic Signature(s) Signed: 12/31/2017 4:06:53 PM By: Larry Crawford Signed: 12/31/2017 5:22:42 PM By: Larry Keeler PA-C Entered By: Larry Crawford on 12/31/2017 09:17:48 IMRE, VECCHIONE ([id number]) -------------------------------------------------------------------------------- [place] Details Patient Name: WELBY, MONTMINY. Date of Service: 12/31/2017 Medical Record Number: [id number] Patient Account Number: [id number] Date of Birth/Sex: 23-Oct-1941 (77 y.o. Male) Treating RN: Larry Crawford Primary Care Provider: Lelon Crawford Other Clinician: Referring Provider: Lisa Crawford Treating Provider/Extender: Larry Crawford, Larry Crawford Weeks in Treatment: 0 Diagnosis Coding ICD-10 Codes Code Description W54.0XXA Bitten by dog, initial encounter [phone] 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 layer exposed Facility Procedures CPT4 Code: 05397673 Description: 99213 - WOUND CARE VISIT-LEV 3 EST PT Modifier: Quantity: 1 CPT4 Code: 41937902 Description: 40973 - DEBRIDE WOUND 1ST 20 SQ CM OR < ICD-10 Diagnosis Description L98.492 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 lay Modifier: er exposed Quantity: 1 Physician Procedures CPT4 Code: [id number] Description: WC PHYS LEVEL 3 o NEW PT ICD-10 Diagnosis Description W54.0XXA Bitten by dog, initial encounter [phone] Non-pressure chronic ulcer of skin  of other sites with fat lay Modifier: 25 er exposed Quantity: 1 CPT4 Code: 9622297 Description: [place] - WC PHYS DEBR WO ANESTH 20 SQ CM ICD-10 Diagnosis Description L98.492 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 lay Modifier: er exposed Quantity: 1 Electronic Signature(s) Signed: 12/31/2017 5:22:42 PM By: Larry Keeler PA-C Entered By: Larry Crawford on 12/31/2017 10:07:42

## 2018-01-01 NOTE — Progress Notes (Signed)
Larry, Crawford ([id number]) Visit Report for 12/31/2017 Abuse/Suicide Risk Screen Details Patient Name: Larry Crawford, Larry Crawford. Date of Service: 12/31/2017 8:45 AM Medical Record Number: [id number] Patient Account Number: [id number] Date of Birth/Sex: 03-Aug-1941 (77 y.o. Male) Treating RN: Montey Hora Primary Care Saifullah Jolley: Lelon Huh Other Clinician: Referring Clydell Alberts: Lisa Roca Treating Vang Kraeger/Extender: Melburn Hake, HOYT Weeks in Treatment: 0 Abuse/Suicide Risk Screen Items Answer ABUSE/SUICIDE RISK SCREEN: Has anyone close to you tried to hurt or harm you recentlyo No Do you feel uncomfortable with anyone in your familyo No Has anyone forced you do things that you didnot want to doo No Do you have any thoughts of harming yourselfo No Patient displays signs or symptoms of abuse and/or neglect. No Electronic Signature(s) Signed: 12/31/2017 4:06:53 PM By: Montey Hora Entered By: Montey Hora on 12/31/2017 09:17:53 Larry Crawford ([id number]) -------------------------------------------------------------------------------- Activities of Daily Living Details Patient Name: Larry, Crawford. Date of Service: 12/31/2017 8:45 AM Medical Record Number: [id number] Patient Account Number: [id number] Date of Birth/Sex: 1941/10/15 (77 y.o. Male) Treating RN: Montey Hora Primary Care Derek Huneycutt: Lelon Huh Other Clinician: Referring Evalyse Stroope: Lisa Roca Treating Arthor Gorter/Extender: Melburn Hake, HOYT Weeks in Treatment: 0 Activities of Daily Living Items Answer Activities of Daily Living (Please select one for each item) Drive Automobile Completely Able Take Medications Completely Able Use Telephone Completely Able Care for Appearance Completely Able Use Toilet Completely Able Bath / Shower Completely Able Dress Self Completely Able Feed Self Completely Able Walk Completely Able Get In / Out Bed Completely Able Housework Completely Able Prepare Meals Completely  [place] for Self Completely Able Electronic Signature(s) Signed: 12/31/2017 4:06:53 PM By: Montey Hora Entered By: Montey Hora on 12/31/2017 09:17:56 Larry Crawford ([id number]) -------------------------------------------------------------------------------- Education Assessment Details Patient Name: Larry, Crawford. Date of Service: 12/31/2017 8:45 AM Medical Record Number: [id number] Patient Account Number: [id number] Date of Birth/Sex: 06/17/1941 (77 y.o. Male) Treating RN: Montey Hora Primary Care Tayvin Preslar: Lelon Huh Other Clinician: Referring Eulan Heyward: Lisa Roca Treating Emrik Erhard/Extender: Sharalyn Ink in Treatment: 0 Primary Learner Assessed: Patient Learning Preferences/Education Level/Primary Language Learning Preference: Explanation, Demonstration Highest Education Level: High School Preferred Language: English Cognitive Barrier Assessment/Beliefs Language Barrier: No Translator Needed: No Memory Deficit: No Emotional Barrier: No Cultural/Religious Beliefs Affecting Medical Care: No Physical Barrier Assessment Impaired Vision: No Impaired Hearing: No Decreased Hand dexterity: No Knowledge/Comprehension Assessment Knowledge Level: Medium Comprehension Level: Medium Ability to understand written Medium instructions: Ability to understand verbal Medium instructions: Motivation Assessment Anxiety Level: Calm Cooperation: Cooperative Education Importance: Acknowledges Need Interest in Health Problems: Asks Questions Perception: Coherent Willingness to Engage in Self- Medium Management Activities: Readiness to Engage in Self- Medium Management Activities: Electronic Signature(s) Signed: 12/31/2017 4:06:53 PM By: Montey Hora Entered By: Montey Hora on 12/31/2017 09:01:53 [name], [name] ([id number]) -------------------------------------------------------------------------------- Fall Risk  Assessment Details Patient Name: Larry Crawford. Date of Service: 12/31/2017 8:45 AM Medical Record Number: [id number] Patient Account Number: [id number] Date of Birth/Sex: 08-21-1941 (77 y.o. Male) Treating RN: Montey Hora Primary Care Brogan England: Lelon Huh Other Clinician: Referring Juron Vorhees: Lisa Roca Treating Czarina Gingras/Extender: Melburn Hake, HOYT Weeks in Treatment: 0 Fall Risk Assessment Items Have you had 2 or more falls in the last 12 monthso 0 No Have you had any fall that resulted in injury in the last 12 monthso 0 No FALL RISK ASSESSMENT: History of falling - immediate or within 3 months 0 No Secondary diagnosis 0 No Ambulatory aid None/bed rest/wheelchair/nurse 0 Yes Crutches/cane/walker 0 No Furniture 0  No IV Access/Saline Lock 0 No Gait/Training Normal/bed rest/immobile 0 Yes Weak 0 No Impaired 0 No Mental Status Oriented to own ability 0 Yes Electronic Signature(s) Signed: 12/31/2017 4:06:53 PM By: Montey Hora Entered By: Montey Hora on 12/31/2017 09:02:01 NICOLAE, VASEK ([id number]) -------------------------------------------------------------------------------- Foot Assessment Details Patient Name: Crawford, Larry. Date of Service: 12/31/2017 8:45 AM Medical Record Number: [id number] Patient Account Number: [id number] Date of Birth/Sex: 11-06-1941 (77 y.o. Male) Treating RN: Montey Hora Primary Care Shermaine Brigham: Lelon Huh Other Clinician: Referring Sender Rueb: Lisa Roca Treating Asmara Backs/Extender: Melburn Hake, HOYT Weeks in Treatment: 0 Foot Assessment Items Site Locations + = Sensation present, - = Sensation absent, C = Callus, U = Ulcer R = Redness, W = Warmth, M = Maceration, PU = Pre-ulcerative lesion F = Fissure, S = Swelling, D = Dryness Assessment Right: Left: Other Deformity: No No Prior Foot Ulcer: No No Prior Amputation: No No Charcot Joint: No No Ambulatory Status: Gait: Electronic Signature(s) Signed: 12/31/2017  4:06:53 PM By: Montey Hora Entered By: Montey Hora on 12/31/2017 09:18:07 Larry Crawford ([id number]) -------------------------------------------------------------------------------- Nutrition Risk Assessment Details Patient Name: Crawford, Larry. Date of Service: 12/31/2017 8:45 AM Medical Record Number: [id number] Patient Account Number: [id number] Date of Birth/Sex: Nov 14, 1941 (77 y.o. Male) Treating RN: Montey Hora Primary Care Jayr Lupercio: Lelon Huh Other Clinician: Referring Lory Nowaczyk: Lisa Roca Treating Laityn Bensen/Extender: Melburn Hake, HOYT Weeks in Treatment: 0 Height (in): Weight (lbs): Body Mass Index (BMI): Nutrition Risk Assessment Items NUTRITION RISK SCREEN: I have an illness or condition that made me change the kind and/or amount of 0 No food I eat I eat fewer than two meals per day 0 No I eat few fruits and vegetables, or milk products 0 No I have three or more drinks of beer, liquor or wine almost every day 0 No I have tooth or mouth problems that make it hard for me to eat 0 No I don't always have enough money to buy the food I need 0 No I eat alone most of the time 0 No I take three or more different prescribed or over-the-counter drugs a day 1 Yes Without wanting to, I have lost or gained 10 pounds in the last six months 0 No I am not always physically able to shop, cook and/or feed myself 0 No Nutrition Protocols Good Risk Protocol 0 No interventions needed Moderate Risk Protocol Electronic Signature(s) Signed: 12/31/2017 4:06:53 PM By: Montey Hora Entered By: Montey Hora on 12/31/2017 09:02:08

## 2018-01-03 ENCOUNTER — Emergency Department
Admission: EM | Admit: 2018-01-03 | Discharge: 2018-01-03 | Disposition: A | Discharge status: Home / Self Care | Payer: Medicare Other | Attending: Emergency Medicine | Admitting: Emergency Medicine

## 2018-01-03 ENCOUNTER — Encounter: Payer: Self-pay | Admitting: Intensive Care

## 2018-01-03 DIAGNOSIS — Z2914 Encounter for prophylactic rabies immune globin: Secondary | ICD-10-CM | POA: Diagnosis not present

## 2018-01-03 DIAGNOSIS — Z23 Encounter for immunization: Secondary | ICD-10-CM

## 2018-01-03 DIAGNOSIS — Z87891 Personal history of nicotine dependence: Secondary | ICD-10-CM | POA: Insufficient documentation

## 2018-01-03 DIAGNOSIS — Z203 Contact with and (suspected) exposure to rabies: Secondary | ICD-10-CM | POA: Insufficient documentation

## 2018-01-03 DIAGNOSIS — Z79899 Other long term (current) drug therapy: Secondary | ICD-10-CM | POA: Diagnosis not present

## 2018-01-03 MED ORDER — RABIES VACCINE, PCEC IM SUSR
1.0000 mL | Freq: Once | INTRAMUSCULAR | Status: AC
  Administered 2018-01-03: 1 mL via INTRAMUSCULAR
  Filled 2018-01-03: qty 1

## 2018-01-03 NOTE — ED Notes (Signed)
See triage note  States he is here for rabies injection

## 2018-01-03 NOTE — ED Triage Notes (Signed)
Patient is here for another rabies injection. Reports last one was Monday.

## 2018-01-03 NOTE — ED Provider Notes (Signed)
[facility] Emergency Department Provider Note  ____________________________________________  Time seen: Approximately 8:31 AM  I have reviewed the triage vital signs and the nursing notes.   HISTORY  Chief Complaint Rabies Injection    HPI Larry Crawford is a 77 y.o. male that presents to the emergency department for rabies injection.  Patient is on his third of fourth rabies vaccination after being bitten by a dog with unknown vaccination status 1 week ago.  Patient has a skin tear to left forearm and is seeing a wound care.  He has 14 weeks worth of visits with wound care.  No questions or concerns with rabies vaccination series.  Past Medical History:  Diagnosis Date  . BPH (benign prostatic hypertrophy)   . Colon polyp   . Elevated red blood cell count   . Gout   . History of chicken pox   . History of measles   . History of mumps   . IBS (irritable bowel syndrome)     Patient Active Problem List   Diagnosis Date Noted  . Hyperglycemia 12/14/2016  . Arthritis 07/16/2016  .   09/28/2015  . Alopecia 09/26/2015  . Arthralgia of right hand 09/26/2015  . BPH (benign prostatic hyperplasia) 09/26/2015  . Bursitis of left shoulder 09/26/2015  . Moderate episode of recurrent major depressive disorder ([place]) 09/26/2015  . Tinnitus 09/26/2015  . Vitamin D deficiency 09/26/2015  . 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 Elevated prostate specific antigen (PSA) 08/25/2012  . Myalgia and myositis 05/08/2010  . History of colon polyps 02/21/2010  . Breast lump 06/16/2009  . 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 IBS (irritable bowel syndrome) 03/29/2009  . Malaise and fatigue 03/29/2009  . Gout 03/15/2009  . Mixed hyperlipidemia 03/15/2009    Past Surgical History:  Procedure Laterality Date  . aortic ultrasound  2013   normal. Screening ultrasound at [facility] per pt report  . 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 BUNIONECTOMY     left   . CHOLECYSTECTOMY  1997  . COLONOSCOPY    . INGUINAL HERNIA REPAIR  [phone]   2 on right/1 on left  . LIPOMA EXCISION  2013  . NOSE SURGERY     divided septum  . POLYPECTOMY    . [place]  . SHOULDER ARTHROSCOPY  1984 and 1987   left X2 for spurs  . TONSILLECTOMY AND ADENOIDECTOMY  2002  . UMBILICAL HERNIA REPAIR      Prior to Admission medications   Medication Sig Start Date End Date Taking? Authorizing Provider  Acetylcysteine 600 MG CAPS Take 1 capsule by mouth daily.    [provider]  allopurinol (ZYLOPRIM) 300 MG tablet TAKE 1 TABLET DAILY 07/26/17   Birdie Sons, MD  amoxicillin-clavulanate (AUGMENTIN) 875-125 MG tablet Take 1 tablet by mouth 2 (two) times daily for 10 days. 12/27/17 01/06/18  Laban Emperor, PA-C  arginine 500 MG tablet Take 1 tablet by mouth 2 (two) times daily.    [provider]  Cholecalciferol (VITAMIN D3) 5000 units CAPS Take 1 capsule by mouth daily.    [provider]  fexofenadine (ALLEGRA) 180 MG tablet Take 180 mg by mouth daily.    [provider]  GINSENG EXTRACT PO Take by mouth.    [provider]  HYDROcodone-acetaminophen (NORCO) 5-325 MG tablet Take 1 tablet by mouth every 4 (four) hours as needed for moderate pain. 12/27/17   Laban Emperor,  PA-C  INOSITOL PO Take 600 mg by mouth daily.      [provider]  Kelp 225 MCG TABS Take 1 tablet by mouth 2 (two) times a week. Takes 2-3 times a week     [provider]  Lutein 20 MG CAPS Take 1 capsule by mouth daily.      [provider]  Multiple Vitamins-Minerals (MULTIVITAMIN WITH MINERALS) tablet Take 1 tablet by mouth daily.      [provider]  niacin 500 MG tablet Take 500 mg by mouth at bedtime.     [provider]  Probiotic CAPS Take 1 capsule by mouth daily.    [provider]  tadalafil (CIALIS) 5 MG tablet Take 1 tablet (5 mg total) by mouth daily. 11/29/17   Birdie Sons, MD   vitamin B-12 (CYANOCOBALAMIN) 250 MCG tablet Take 1 tablet by mouth daily.    [provider]  Whey Protein POWD Take 4 g by mouth once a week.     [provider]    Allergies Tetracyclines & related; Colestipol hcl; Corn-containing products; Pravastatin sodium; Shellfish allergy; Sulfa antibiotics; and Zetia [ezetimibe]  Family History  Problem Relation Age of Onset  . Hypothyroidism Mother   . Diabetes Brother   . Obesity Brother   . Cancer Brother        throat    Social History Social History   Tobacco Use  . Smoking status: Former Smoker    Packs/day: 0.75    Years: 5.00    Pack years: 3.75    Types: Cigarettes    Last attempt to quit: 11/19/1958    Years since quitting: 59.1  . Smokeless tobacco: Never Used  Substance Use Topics  . Alcohol use: Yes    Alcohol/week: 0.0 oz    Comment: 1 wine drink a month  . Drug use: No     Review of Systems  Constitutional: No fever/chills Cardiovascular: No chest pain. Respiratory: No SOB. Gastrointestinal: No nausea, no vomiting.    ____________________________________________   PHYSICAL EXAM:  VITAL SIGNS: ED Triage Vitals  Enc Vitals Group     BP 01/03/18 0724 (!) 151/77     Pulse Rate 01/03/18 0724 74     Resp 01/03/18 0724 16     Temp --      Temp src --      SpO2 01/03/18 0724 96 %     Weight 01/03/18 0723 198 lb (89.8 kg)     Height 01/03/18 0723 5\' 8"  (1.727 m)     Head Circumference --      Peak Flow --      Pain Score 01/03/18 0723 2     Pain Loc --      Pain Edu? --      Excl. in [place]? --      Constitutional: Alert and oriented. Well appearing and in no acute distress. Eyes: Conjunctivae are normal. PERRL. EOMI. Head: Atraumatic. ENT:      Ears:      Nose: No congestion/rhinnorhea.      Mouth/Throat: Mucous membranes are moist.  Neck: No stridor.  Cardiovascular:Good peripheral circulation. Respiratory: Normal respiratory effort without tachypnea or  retractions. Musculoskeletal: Full range of motion to all extremities. No gross deformities appreciated. Neurologic:  Normal speech and language. No gross focal neurologic deficits are appreciated.  Skin:  Skin is warm, dry. Bandage in place.   ____________________________________________   LABS (all labs ordered are listed, but only  abnormal results are displayed)  Labs Reviewed - No data to display ____________________________________________  EKG   ____________________________________________  RADIOLOGY   No results found.  ____________________________________________    PROCEDURES  Procedure(s) performed:    Procedures    Medications  rabies vaccine (RABAVERT) injection 1 mL (1 mL Intramuscular Given 01/03/18 0759)     ____________________________________________   INITIAL IMPRESSION / ASSESSMENT AND PLAN / ED COURSE  Pertinent labs & imaging results that were available during my care of the patient were reviewed by me and considered in my medical decision making (see chart for details).  Review of the [place] CSRS was performed in accordance of the [place] prior to dispensing any controlled drugs.   Patient presented to the emergency department for third of four rabies vaccinations.  Vital signs and exam are reassuring.  Patient has has questions or concerns regarding rabies vaccinations. He will return to the ED in 1 week for final vaccine. He is seeing wound care for skin tear. Patient is to follow up with PCP as directed. Patient is given ED precautions to return to the ED for any worsening or new symptoms.   ____________________________________________  FINAL CLINICAL IMPRESSION(S) / ED DIAGNOSES  Final diagnoses:  Encounter for repeat administration of rabies vaccination      NEW MEDICATIONS STARTED DURING THIS VISIT:  ED Discharge Orders    None          This chart was dictated using voice recognition software/Dragon. Despite best efforts  to proofread, errors can occur which can change the meaning. Any change was purely unintentional.   Laban Emperor, PA-C 01/03/18 1535  Delman Kitten, MD 01/03/18 [phone]

## 2018-01-07 ENCOUNTER — Encounter: Payer: Medicare Other | Admitting: Physician Assistant

## 2018-01-07 DIAGNOSIS — M109 Gout, unspecified: Secondary | ICD-10-CM | POA: Diagnosis not present

## 2018-01-07 DIAGNOSIS — L98492 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 layer exposed: Secondary | ICD-10-CM | POA: Diagnosis not present

## 2018-01-07 DIAGNOSIS — Z87891 Personal history of nicotine dependence: Secondary | ICD-10-CM | POA: Diagnosis not present

## 2018-01-08 NOTE — Progress Notes (Signed)
RONAL, MAYBURY ([id number]) Visit Report for 01/07/2018 Arrival Information Details Patient Name: Larry Crawford, Larry Crawford. Date of Service: 01/07/2018 1:00 PM Medical Record Number: [id number] Patient Account Number: [id number] Date of Birth/Sex: Jul 18, 1941 (77 y.o. Male) Treating RN: Montey Hora Primary Care Jiovanna Frei: Lelon Huh Other Clinician: Referring Graves Nipp: Lelon Huh Treating Mileidy Atkin/Extender: Melburn Hake, HOYT Weeks in Treatment: 1 Visit Information History Since Last Visit Added or deleted any medications: No Patient Arrived: Ambulatory Any new allergies or adverse reactions: No Arrival Time: 13:00 Had a fall or experienced change in No Accompanied By: self activities of daily living that may affect Transfer Assistance: None risk of falls: Patient Identification Verified: Yes Signs or symptoms of abuse/neglect since last visito No Secondary Verification Process Completed: Yes Hospitalized since last visit: No Has Dressing in Place as Prescribed: Yes Pain Present Now: No Electronic Signature(s) Signed: 01/07/2018 4:30:06 PM By: Montey Hora Entered By: Montey Hora on 01/07/2018 13:00:38 [name] ([id number]) -------------------------------------------------------------------------------- Clinic Level of Care Assessment Details Patient Name: Larry Crawford, Larry Crawford. Date of Service: 01/07/2018 1:00 PM Medical Record Number: [id number] Patient Account Number: [id number] Date of Birth/Sex: [birth year] (77 y.o. Male) Treating RN: Ahmed Prima Primary Care Fusaye Wachtel: Lelon Huh Other Clinician: Referring Deklen Popelka: Lelon Huh Treating Leonie Amacher/Extender: Melburn Hake, HOYT Weeks in Treatment: 1 Clinic Level of Care Assessment Items TOOL 4 Quantity Score X - Use when only an EandM is performed on FOLLOW-UP visit 1 0 ASSESSMENTS - Nursing Assessment / Reassessment X - Reassessment of Co-morbidities (includes updates in patient status) 1 10 X- 1  5 Reassessment of Adherence to Treatment Plan ASSESSMENTS - Wound and Skin Assessment / Reassessment X - Simple Wound Assessment / Reassessment - one wound 1 5 '[]'  - 0 Complex Wound Assessment / Reassessment - multiple wounds '[]'  - 0 Dermatologic / Skin Assessment (not related to wound area) ASSESSMENTS - Focused Assessment '[]'  - Circumferential Edema Measurements - multi extremities 0 '[]'  - 0 Nutritional Assessment / Counseling / Intervention '[]'  - 0 Lower Extremity Assessment (monofilament, tuning fork, pulses) '[]'  - 0 Peripheral Arterial Disease Assessment (using hand held doppler) ASSESSMENTS - Ostomy and/or Continence Assessment and Care '[]'  - Incontinence Assessment and Management 0 '[]'  - 0 Ostomy Care Assessment and Management (repouching, etc.) PROCESS - Coordination of Care X - Simple Patient / Family Education for ongoing care 1 15 '[]'  - 0 Complex (extensive) Patient / Family Education for ongoing care '[]'  - 0 Staff obtains Programmer, systems, Records, Test Results / Process Orders '[]'  - 0 Staff telephones HHA, Nursing Homes / Clarify orders / etc '[]'  - 0 Routine Transfer to another Facility (non-emergent condition) '[]'  - 0 Routine Hospital Admission (non-emergent condition) '[]'  - 0 New Admissions / Biomedical engineer / Ordering NPWT, Apligraf, etc. '[]'  - 0 Emergency Hospital Admission (emergent condition) X- 1 10 Simple Discharge Coordination Larry Crawford, Larry Crawford ([id number]) '[]'  - 0 Complex (extensive) Discharge Coordination PROCESS - Special Needs '[]'  - Pediatric / Minor Patient Management 0 '[]'  - 0 Isolation Patient Management '[]'  - 0 Hearing / Language / Visual special needs '[]'  - 0 Assessment of Community assistance (transportation, D/C planning, etc.) '[]'  - 0 Additional assistance / Altered mentation '[]'  - 0 Support Surface(s) Assessment (bed, cushion, seat, etc.) INTERVENTIONS - Wound Cleansing / Measurement X - Simple Wound Cleansing - one wound 1 5 '[]'  - 0 Complex Wound  Cleansing - multiple wounds X- 1 5 Wound Imaging (photographs - any number of wounds) '[]'  - 0 Wound Tracing (instead of photographs) X- 1 5 Simple  Wound Measurement - one wound '[]'  - 0 Complex Wound Measurement - multiple wounds INTERVENTIONS - Wound Dressings X - Small Wound Dressing one or multiple wounds 1 10 '[]'  - 0 Medium Wound Dressing one or multiple wounds '[]'  - 0 Large Wound Dressing one or multiple wounds X- 1 5 Application of Medications - topical '[]'  - 0 Application of Medications - injection INTERVENTIONS - Miscellaneous '[]'  - External ear exam 0 '[]'  - 0 Specimen Collection (cultures, biopsies, blood, body fluids, etc.) '[]'  - 0 Specimen(s) / Culture(s) sent or taken to Lab for analysis '[]'  - 0 Patient Transfer (multiple staff / Civil Service fast streamer / Similar devices) '[]'  - 0 Simple Staple / Suture removal (25 or less) '[]'  - 0 Complex Staple / Suture removal (26 or more) '[]'  - 0 Hypo / Hyperglycemic Management (close monitor of Blood Glucose) '[]'  - 0 Ankle / Brachial Index (ABI) - do not check if billed separately X- 1 5 Vital Signs Larry Crawford, Larry Crawford. ([id number]) Has the patient been seen at the hospital within the last three years: Yes Total Score: 80 Level Of Care: New/Established - Level 3 Electronic Signature(s) Signed: 01/07/2018 4:06:48 PM By: Alric Quan Entered By: Alric Quan on 01/07/2018 15:25:07 [name] ([id number]) -------------------------------------------------------------------------------- Encounter Discharge Information Details Patient Name: Larry Crawford, Larry Crawford. Date of Service: 01/07/2018 1:00 PM Medical Record Number: [id number] Patient Account Number: [id number] Date of Birth/Sex: [birth year] (77 y.o. Male) Treating RN: Ahmed Prima Primary Care Nathanyl Andujo: Lelon Huh Other Clinician: Referring Stonewall Doss: Lelon Huh Treating Eswin Worrell/Extender: Melburn Hake, HOYT Weeks in Treatment: 1 Encounter Discharge Information Items Discharge  Pain Level: 0 Discharge Condition: Stable Ambulatory Status: Ambulatory Discharge Destination: Home Transportation: Private Auto Accompanied By: self Schedule Follow-up Appointment: Yes Medication Reconciliation completed and No provided to Patient/Care Mariadelaluz Guggen[place]: Provided on Clinical Summary of Care: 01/07/2018 Form Type Recipient Paper Patient RB Electronic Signature(s) Signed: 01/07/2018 2:14:45 PM By: Roger Shelter Entered By: Roger Shelter on 01/07/2018 13:23:35 [name] ([id number]) -------------------------------------------------------------------------------- Lower Extremity Assessment Details Patient Name: Larry Crawford, Larry Crawford. Date of Service: 01/07/2018 1:00 PM Medical Record Number: [id number] Patient Account Number: [id number] Date of Birth/Sex: 04/08/1941 (77 y.o. Male) Treating RN: Montey Hora Primary Care Dewane Timson: Lelon Huh Other Clinician: Referring Leocadia Idleman: Lelon Huh Treating Romelia Bromell/Extender: Melburn Hake, HOYT Weeks in Treatment: 1 Electronic Signature(s) Signed: 01/07/2018 4:30:06 PM By: Montey Hora Entered By: Montey Hora on 01/07/2018 13:07:10 AYRTON, MCVAY ([id number]) -------------------------------------------------------------------------------- Multi Wound Chart Details Patient Name: Larry Crawford, Larry Crawford. Date of Service: 01/07/2018 1:00 PM Medical Record Number: [id number] Patient Account Number: [id number] Date of Birth/Sex: July 17, 1941 (77 y.o. Male) Treating RN: Ahmed Prima Primary Care Kellyanne Ellwanger: Lelon Huh Other Clinician: Referring Shakira Los: Lelon Huh Treating Essance Gatti/Extender: Melburn Hake, HOYT Weeks in Treatment: 1 Vital Signs Height(in): 68 Pulse(bpm): 9 Weight(lbs): 198 Blood Pressure(mmHg): 145/64 Body Mass Index(BMI): 30 Temperature(F): 98.3 Respiratory Rate 18 (breaths/min): Photos: [1:No Photos] [N/A:N/A] Wound Location: [1:Left Forearm] [N/A:N/A] Wounding Event: [1:Bite]  [N/A:N/A] Primary Etiology: [1:Trauma, Other] [N/A:N/A] Comorbid History: [1:Gout, Osteoarthritis] [N/A:N/A] Date Acquired: [1:12/27/2017] [N/A:N/A] Weeks of Treatment: [1:1] [N/A:N/A] Wound Status: [1:Open] [N/A:N/A] Measurements L x W x D [1:4.9x3.9x0.1] [N/A:N/A] (cm) Area (cm) : [1:15.009] [N/A:N/A] Volume (cm) : [1:1.501] [N/A:N/A] % Reduction in Area: [1:9.30%] [N/A:N/A] % Reduction in Volume: [1:9.30%] [N/A:N/A] Classification: [1:Full Thickness Without Exposed Support Structures] [N/A:N/A] Exudate Amount: [1:Large] [N/A:N/A] Exudate Type: [1:Serosanguineous] [N/A:N/A] Exudate Color: [1:red, brown] [N/A:N/A] Wound Margin: [1:Distinct, outline attached] [N/A:N/A] Granulation Amount: [1:Large (67-100%)] [N/A:N/A] Granulation Quality: [1:Red] [N/A:N/A] Necrotic Amount: [1:Small (1-33%)] [N/A:N/A] Necrotic Tissue: [  1:Eschar, Adherent Slough] [N/A:N/A] Exposed Structures: [1:Fascia: No Fat Layer (Subcutaneous Tissue) Exposed: No Tendon: No Muscle: No Joint: No Bone: No] [N/A:N/A] Epithelialization: [1:Medium (34-66%)] [N/A:N/A] Periwound Skin Texture: [1:Excoriation: No Induration: No Callus: No Crepitus: No] [N/A:N/A] Rash: No Scarring: No Periwound Skin Moisture: Maceration: Yes N/A N/A Dry/Scaly: No Periwound Skin Color: Erythema: Yes N/A N/A Atrophie Blanche: No Cyanosis: No Ecchymosis: No Hemosiderin Staining: No Mottled: No Pallor: No Rubor: No Erythema Location: Circumferential N/A N/A Temperature: No Abnormality N/A N/A Tenderness on Palpation: Yes N/A N/A Wound Preparation: Ulcer Cleansing: N/A N/A Rinsed/Irrigated with Saline Topical Anesthetic Applied: Other: lidocaine 4% Treatment Notes Electronic Signature(s) Signed: 01/07/2018 4:06:48 PM By: Alric Quan Entered By: Alric Quan on 01/07/2018 13:13:16 Larry Crawford, Larry Crawford ([id number]) -------------------------------------------------------------------------------- [place]  Details Patient Name: Larry Crawford, Larry Crawford. Date of Service: 01/07/2018 1:00 PM Medical Record Number: [id number] Patient Account Number: [id number] Date of Birth/Sex: [birth year] (77 y.o. Male) Treating RN: Ahmed Prima Primary Care Wylodean Shimmel: Lelon Huh Other Clinician: Referring Siddhartha Hoback: Lelon Huh Treating Sheniqua Carolan/Extender: Melburn Hake, HOYT Weeks in Treatment: 1 Active Inactive ` Orientation to the Wound Care Program Nursing Diagnoses: Knowledge deficit related to the wound healing center program Goals: Patient/caregiver will verbalize understanding of the [place] Program Date Initiated: 12/31/2017 Target Resolution Date: 03/29/2018 Goal Status: Active Interventions: Provide education on orientation to the wound center Notes: ` Pain, Acute or Chronic Nursing Diagnoses: Pain, acute or chronic: actual or potential Goals: Patient/caregiver will verbalize comfort level met Date Initiated: 12/31/2017 Target Resolution Date: 03/29/2018 Goal Status: Active Interventions: Complete pain assessment as per visit requirements Notes: ` Wound/Skin Impairment Nursing Diagnoses: Impaired tissue integrity Goals: Ulcer/skin breakdown will heal within 14 weeks Date Initiated: 12/31/2017 Target Resolution Date: 03/29/2018 Goal Status: Active Interventions: Larry Crawford, Larry Crawford ([id number]) Assess patient/caregiver ability to obtain necessary supplies Assess patient/caregiver ability to perform ulcer/skin care regimen upon admission and as needed Assess ulceration(s) every visit Notes: Electronic Signature(s) Signed: 01/07/2018 4:06:48 PM By: Alric Quan Entered By: Alric Quan on 01/07/2018 13:13:07 [name] ([id number]) -------------------------------------------------------------------------------- Pain Assessment Details Patient Name: Larry Crawford, Larry Crawford. Date of Service: 01/07/2018 1:00 PM Medical Record Number: [id number] Patient Account Number:  [id number] Date of Birth/Sex: [birth year] (77 y.o. Male) Treating RN: Montey Hora Primary Care Nayeli Calvert: Lelon Huh Other Clinician: Referring Gracelin Weisberg: Lelon Huh Treating Devinn Hurwitz/Extender: Melburn Hake, HOYT Weeks in Treatment: 1 Active Problems Location of Pain Severity and Description of Pain Patient Has Paino Yes Site Locations Pain Location: Pain in Ulcers With Dressing Change: Yes Duration of the Pain. Constant / Intermittento Intermittent Pain Management and Medication Current Pain Management: Notes Topical or injectable lidocaine is offered to patient for acute pain when surgical debridement is performed. If needed, Patient is instructed to use over the counter pain medication for the following 24-48 hours after debridement. Wound care MDs do not prescribed pain medications. Patient has chronic pain or uncontrolled pain. Patient has been instructed to make an appointment with their Primary Care Physician for pain management. Electronic Signature(s) Signed: 01/07/2018 4:30:06 PM By: Montey Hora Entered By: Montey Hora on 01/07/2018 13:01:06 [name] ([id number]) -------------------------------------------------------------------------------- Patient/Caregiver Education Details Patient Name: Larry Crawford, Larry Crawford. Date of Service: 01/07/2018 1:00 PM Medical Record Number: [id number] Patient Account Number: [id number] Date of Birth/Gender: Dec 15, 1940 (77 y.o. Male) Treating RN: Roger Shelter Primary Care Physician: Lelon Huh Other Clinician: Referring Physician: Lelon Huh Treating Physician/Extender: Sharalyn Ink in Treatment: 1 Education Assessment Education Provided To: Patient Education Topics Provided Wound/Skin Impairment: Handouts: Caring  for Your Ulcer Methods: Explain/Verbal Responses: State content correctly Electronic Signature(s) Signed: 01/07/2018 2:14:45 PM By: Roger Shelter Entered By: Roger Shelter on  01/07/2018 13:23:48 DONSHAY, LUPINSKI ([id number]) -------------------------------------------------------------------------------- Wound Assessment Details Patient Name: JESSY, CYBULSKI. Date of Service: 01/07/2018 1:00 PM Medical Record Number: [id number] Patient Account Number: [id number] Date of Birth/Sex: 10-18-1941 (77 y.o. Male) Treating RN: Montey Hora Primary Care Chyrl Elwell: Lelon Huh Other Clinician: Referring Clinton Dragone: Lelon Huh Treating Airianna Kreischer/Extender: Melburn Hake, HOYT Weeks in Treatment: 1 Wound Status Wound Number: 1 Primary Etiology: Trauma, Other Wound Location: Left Forearm Wound Status: Open Wounding Event: Bite Comorbid History: Gout, Osteoarthritis Date Acquired: 12/27/2017 Weeks Of Treatment: 1 Clustered Wound: No Photos Photo Uploaded By: Montey Hora on 01/07/2018 14:17:12 Wound Measurements Length: (cm) 4.9 Width: (cm) 3.9 Depth: (cm) 0.1 Area: (cm) 15.009 Volume: (cm) 1.501 % Reduction in Area: 9.3% % Reduction in Volume: 9.3% Epithelialization: Medium (34-66%) Tunneling: No Undermining: No Wound Description Full Thickness Without Exposed Support Classification: Structures Wound Margin: Distinct, outline attached Exudate Large Amount: Exudate Type: Serosanguineous Exudate Color: red, brown Foul Odor After Cleansing: No Slough/Fibrino Yes Wound Bed Granulation Amount: Large (67-100%) Exposed Structure Granulation Quality: Red Fascia Exposed: No Necrotic Amount: Small (1-33%) Fat Layer (Subcutaneous Tissue) Exposed: No Necrotic Quality: Eschar, Adherent Slough Tendon Exposed: No Muscle Exposed: No Joint Exposed: No Bone Exposed: No BREKKEN, BEACH. ([id number]) Periwound Skin Texture Texture Color No Abnormalities Noted: No No Abnormalities Noted: No Callus: No Atrophie Blanche: No Crepitus: No Cyanosis: No Excoriation: No Ecchymosis: No Induration: No Erythema: Yes Rash: No Erythema Location:  Circumferential Scarring: No Hemosiderin Staining: No Mottled: No Moisture Pallor: No No Abnormalities Noted: No Rubor: No Dry / Scaly: No Maceration: Yes Temperature / Pain Temperature: No Abnormality Tenderness on Palpation: Yes Wound Preparation Ulcer Cleansing: Rinsed/Irrigated with Saline Topical Anesthetic Applied: Other: lidocaine 4%, Treatment Notes Wound #1 (Left Forearm) 1. Cleansed with: Clean wound with Normal Saline 2. Anesthetic Topical Lidocaine 4% cream to wound bed prior to debridement 4. Dressing Applied: Xeroform 5. Secondary Dressing Applied Non-Adherent pad Notes conform and mesh netting Electronic Signature(s) Signed: 01/07/2018 4:30:06 PM By: Montey Hora Entered By: Montey Hora on 01/07/2018 13:07:01 NILES, ESS ([id number]) -------------------------------------------------------------------------------- [place] Details Patient Name: MARQUEZE, RAMCHARAN. Date of Service: 01/07/2018 1:00 PM Medical Record Number: [id number] Patient Account Number: [id number] Date of Birth/Sex: May 29, 1941 (77 y.o. Male) Treating RN: Montey Hora Primary Care Micalah Cabezas: Lelon Huh Other Clinician: Referring Elveria Lauderbaugh: Lelon Huh Treating Desmon Hitchner/Extender: Melburn Hake, HOYT Weeks in Treatment: 1 Vital Signs Time Taken: 13:01 Temperature (F): 98.3 Height (in): 68 Pulse (bpm): 65 Weight (lbs): 198 Respiratory Rate (breaths/min): 18 Body Mass Index (BMI): 30.1 Blood Pressure (mmHg): 145/64 Reference Range: 80 - 120 mg / dl Electronic Signature(s) Signed: 01/07/2018 4:30:06 PM By: Montey Hora Entered By: Montey Hora on 01/07/2018 13:03:25

## 2018-01-10 ENCOUNTER — Encounter: Payer: Self-pay | Admitting: Emergency Medicine

## 2018-01-10 ENCOUNTER — Emergency Department
Admission: EM | Admit: 2018-01-10 | Discharge: 2018-01-10 | Disposition: A | Discharge status: Home / Self Care | Payer: Medicare Other | Attending: Emergency Medicine | Admitting: Emergency Medicine

## 2018-01-10 DIAGNOSIS — Z203 Contact with and (suspected) exposure to rabies: Secondary | ICD-10-CM | POA: Diagnosis not present

## 2018-01-10 DIAGNOSIS — Z2914 Encounter for prophylactic rabies immune globin: Secondary | ICD-10-CM | POA: Insufficient documentation

## 2018-01-10 MED ORDER — RABIES VACCINE, PCEC IM SUSR
1.0000 mL | Freq: Once | INTRAMUSCULAR | Status: AC
  Administered 2018-01-10: 1 mL via INTRAMUSCULAR
  Filled 2018-01-10: qty 1

## 2018-01-10 NOTE — ED Provider Notes (Signed)
[facility] Emergency Department Provider Note  ___________________________________________   First MD Initiated Contact with Patient 01/10/18 [phone]     (approximate)  I have reviewed the triage vital signs and the nursing notes.   HISTORY  Chief Complaint Rabies Injection  HPI Larry Crawford is a 77 y.o. male presents to the ED for his last rabies injection.  He is currently not having any problems.  Past Medical History:  Diagnosis Date  . BPH (benign prostatic hypertrophy)   . Colon polyp   . Elevated red blood cell count   . Gout   . History of chicken pox   . History of measles   . History of mumps   . IBS (irritable bowel syndrome)     Patient Active Problem List   Diagnosis Date Noted  . Hyperglycemia 12/14/2016  . Arthritis 07/16/2016  .   09/28/2015  . Alopecia 09/26/2015  . Arthralgia of right hand 09/26/2015  . BPH (benign prostatic hyperplasia) 09/26/2015  . Bursitis of left shoulder 09/26/2015  . Moderate episode of recurrent major depressive disorder ([place]) 09/26/2015  . Tinnitus 09/26/2015  . Vitamin D deficiency 09/26/2015  . 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 Elevated prostate specific antigen (PSA) 08/25/2012  . Myalgia and myositis 05/08/2010  . History of colon polyps 02/21/2010  . Breast lump 06/16/2009  . 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 IBS (irritable bowel syndrome) 03/29/2009  . Malaise and fatigue 03/29/2009  . Gout 03/15/2009  . Mixed hyperlipidemia 03/15/2009    Past Surgical History:  Procedure Laterality Date  . aortic ultrasound  2013   normal. Screening ultrasound at [facility] per pt report  . 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 BUNIONECTOMY     left  . CHOLECYSTECTOMY  1997  . COLONOSCOPY    . INGUINAL HERNIA REPAIR  [phone]   2 on right/1 on left  . LIPOMA EXCISION  2013  . NOSE SURGERY     divided septum  . POLYPECTOMY    . [place]  . SHOULDER ARTHROSCOPY  1984  and 1987   left X2 for spurs  . TONSILLECTOMY AND ADENOIDECTOMY  2002  . UMBILICAL HERNIA REPAIR      Prior to Admission medications   Medication Sig Start Date End Date Taking? Authorizing Provider  Acetylcysteine 600 MG CAPS Take 1 capsule by mouth daily.    [provider]  allopurinol (ZYLOPRIM) 300 MG tablet TAKE 1 TABLET DAILY 07/26/17   Birdie Sons, MD  arginine 500 MG tablet Take 1 tablet by mouth 2 (two) times daily.    [provider]  Cholecalciferol (VITAMIN D3) 5000 units CAPS Take 1 capsule by mouth daily.    [provider]  fexofenadine (ALLEGRA) 180 MG tablet Take 180 mg by mouth daily.    [provider]  GINSENG EXTRACT PO Take by mouth.    [provider]  HYDROcodone-acetaminophen (NORCO) 5-325 MG tablet Take 1 tablet by mouth every 4 (four) hours as needed for moderate pain. 12/27/17   Laban Emperor, PA-C  INOSITOL PO Take 600 mg by mouth daily.      [provider]  Kelp 225 MCG TABS Take 1 tablet by mouth 2 (two) times a week. Takes 2-3 times a week     [provider]  Lutein 20 MG CAPS Take 1 capsule by mouth daily.      [provider]  Multiple  Vitamins-Minerals (MULTIVITAMIN WITH MINERALS) tablet Take 1 tablet by mouth daily.      [provider]  niacin 500 MG tablet Take 500 mg by mouth at bedtime.     [provider]  Probiotic CAPS Take 1 capsule by mouth daily.    [provider]  tadalafil (CIALIS) 5 MG tablet Take 1 tablet (5 mg total) by mouth daily. 11/29/17   Birdie Sons, MD  vitamin B-12 (CYANOCOBALAMIN) 250 MCG tablet Take 1 tablet by mouth daily.    [provider]  Whey Protein POWD Take 4 g by mouth once a week.     [provider]    Allergies Tetracyclines & related; Colestipol hcl; Corn-containing products; Pravastatin sodium; Shellfish allergy; Sulfa antibiotics; and Zetia [ezetimibe]  Family History  Problem Relation  Age of Onset  . Hypothyroidism Mother   . Diabetes Brother   . Obesity Brother   . Cancer Brother        throat    Social History Social History   Tobacco Use  . Smoking status: Former Smoker    Packs/day: 0.75    Years: 5.00    Pack years: 3.75    Types: Cigarettes    Last attempt to quit: 11/19/1958    Years since quitting: 59.1  . Smokeless tobacco: Never Used  Substance Use Topics  . Alcohol use: Yes    Alcohol/week: 0.0 oz    Comment: 1 wine drink a month  . Drug use: No    Review of Systems Constitutional: No fever/chills Cardiovascular: Denies chest pain. Respiratory: Denies shortness of breath. Musculoskeletal: Negative for back pain. Skin: Negative for rash. Neurological: Negative for headaches, focal weakness or numbness. ___________________________________________   PHYSICAL EXAM:  VITAL SIGNS: ED Triage Vitals  Enc Vitals Group     BP --      Pulse Rate 01/10/18 0750 76     Resp 01/10/18 0750 17     Temp 01/10/18 0750 98 F (36.7 C)     Temp Source 01/10/18 0750 Oral     SpO2 01/10/18 0750 98 %     Weight 01/10/18 0749 198 lb (89.8 kg)     Height 01/10/18 0749 5\' 8"  (1.727 m)     Head Circumference --      Peak Flow --      Pain Score --      Pain Loc --      Pain Edu? --      Excl. in [place]? --    Constitutional: Alert and oriented. Well appearing and in no acute distress. Eyes: Conjunctivae are normal.  Head: Atraumatic. Nose: No congestion/rhinnorhea. Neck: No stridor.   Respiratory: Normal respiratory effort.  Musculoskeletal: No lower extremity tenderness nor edema.  No joint effusions. Neurologic:  Normal speech and language. No gross focal neurologic deficits are appreciated. Skin:  Skin is warm, dry and intact. No rash noted.  Psychiatric: Mood and affect are normal. Speech and behavior are normal.  ____________________________________________   LABS (all labs ordered are listed, but only abnormal results are displayed)  Labs  Reviewed - No data to display  PROCEDURES  Procedure(s) performed: None  Procedures  Critical Care performed: No  ____________________________________________   INITIAL IMPRESSION / ASSESSMENT AND PLAN / ED COURSE Patient was given his last RabAvert injection.  He is to follow-up with his primary care provider if any continued problems or concerns. ____________________________________________   FINAL CLINICAL IMPRESSION(S) / ED DIAGNOSES  Final diagnoses:  Rabies  exposure     ED Discharge Orders    None       Note:  This document was prepared using Dragon voice recognition software and may include unintentional dictation errors.    Johnn Hai, PA-C 01/10/18 1323    Delman Kitten, MD 01/10/18 1626

## 2018-01-10 NOTE — ED Notes (Signed)
  Patient came to ER to have last rabies injection administered

## 2018-01-10 NOTE — ED Triage Notes (Signed)
Pt comes into the ED via POV with the need for his last rabies injection.  Patient in NAD at this time and has even and unlabored respirations.

## 2018-01-10 NOTE — Discharge Instructions (Signed)
Follow-up with your primary care provider if any continued problems or concerns. 

## 2018-01-11 NOTE — Progress Notes (Signed)
TORRIAN, CANION ([id number]) Visit Report for 01/07/2018 Chief Complaint Document Details Patient Name: Larry Crawford, Larry Crawford. Date of Service: 01/07/2018 1:00 PM Medical Record Number: [id number] Patient Account Number: [id number] Date of Birth/Sex: 1941-06-17 (77 y.o. Male) Treating RN: Ahmed Prima Primary Care Provider: Lelon Huh Other Clinician: Referring Provider: Lelon Huh Treating Provider/Extender: Melburn Hake, Lilymae Swiech Weeks in Treatment: 1 Information Obtained from: Patient Chief Complaint Left arm dog bite 12/27/17 Electronic Signature(s) Signed: 01/07/2018 5:27:42 PM By: Worthy Keeler PA-C Entered By: Worthy Keeler on 01/07/2018 13:04:16 Larry Crawford, Larry Crawford ([id number]) -------------------------------------------------------------------------------- HPI Details Patient Name: Larry Crawford, Larry Crawford. Date of Service: 01/07/2018 1:00 PM Medical Record Number: [id number] Patient Account Number: [id number] Date of Birth/Sex: [birth year] (77 y.o. Male) Treating RN: Ahmed Prima Primary Care Provider: Lelon Huh Other Clinician: Referring Provider: Lelon Huh Treating Provider/Extender: Melburn Hake, Karna Abed Weeks in Treatment: 1 History of Present Illness HPI Description: 12/31/17 on evaluation today patient arrives in our clinic for initial evaluation concerning a dog bite to the left forearm which occurred this past Friday 12/27/17. He states he was in the park when a large dog ran up to him and bit him on the forearm through his coat. This caused a fairly substantial skin tear as well is a puncture wound on the lateral aspect. The dog as well as the owner then ran off and he was unable to catch the individual to obtain any information. He was seen in the ER that day and they subsequently placed him on antibiotic therapy, Augmentin, provided a tetanus booster since the patient was not sure of his last booster, and also initiated rabies vaccinations which he has been undergoing. This  was due to the fact that they were assuming the dog did not have its proper vaccinations. Since the owner ran off. Patient/hemoglobin A1c was 5.7 and this was in February 2018 he has not had this check this year. He has no diagnosis of formal diabetes. Patient has no other major medical problems that would significantly impact his healing ability. Currently just a telfa pad was placed on the wound and this was covered with a dressing until he came in to see [place] today. 01/07/18 on evaluation today patient's left form actually seems to be doing very well. He is almost done with his rabies vaccination series and the ulcer on his left forearm is showing excellent signs of epithelialization. In general I am happy with the progress that has been made over the past week. Patient states he still has discomfort but not nearly as much as noted during last week's evaluation. Electronic Signature(s) Signed: 01/07/2018 5:27:42 PM By: Worthy Keeler PA-C Entered By: Worthy Keeler on 01/07/2018 17:12:42 Larry Crawford, Larry Crawford ([id number]) -------------------------------------------------------------------------------- Physical Exam Details Patient Name: Larry Crawford, Larry Crawford. Date of Service: 01/07/2018 1:00 PM Medical Record Number: [id number] Patient Account Number: [id number] Date of Birth/Sex: [birth year] (77 y.o. Male) Treating RN: Ahmed Prima Primary Care Provider: Lelon Huh Other Clinician: Referring Provider: Lelon Huh Treating Provider/Extender: STONE III, Beverly Suriano Weeks in Treatment: 1 Constitutional Well-nourished and well-hydrated in no acute distress. Respiratory normal breathing without difficulty. clear to auscultation bilaterally. Cardiovascular regular rate and rhythm with normal S1, S2. Psychiatric this patient is able to make decisions and demonstrates good insight into disease process. Alert and Oriented x 3. pleasant and cooperative. Notes Patient has excellent granulation as well  as good epithelialization noted in regard to his wound at this time. The wound shows no evidence of slough covering currently that cannot be  cleaned off with saline and gauze and in the end everything appears to be doing very well. Electronic Signature(s) Signed: 01/07/2018 5:27:42 PM By: Worthy Keeler PA-C Entered By: Worthy Keeler on 01/07/2018 17:13:53 Larry Crawford, Larry Crawford ([id number]) -------------------------------------------------------------------------------- Physician Orders Details Patient Name: Larry Crawford, Larry Crawford. Date of Service: 01/07/2018 1:00 PM Medical Record Number: [id number] Patient Account Number: [id number] Date of Birth/Sex: [birth year] (77 y.o. Male) Treating RN: Ahmed Prima Primary Care Provider: Lelon Huh Other Clinician: Referring Provider: Lelon Huh Treating Provider/Extender: Melburn Hake, Rye Decoste Weeks in Treatment: 1 Verbal / Phone Orders: Yes Clinician: Carolyne Fiscal, Debi Read Back and Verified: Yes Diagnosis Coding ICD-10 Coding Code Description W54.0XXA Bitten by dog, initial encounter [phone] 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 layer exposed Wound Cleansing Wound #1 Left Forearm o Clean wound with Normal Saline. o May Shower, gently pat wound dry prior to applying new dressing. Anesthetic (add to Medication List) Wound #1 Left Forearm o Topical Lidocaine 4% cream applied to wound bed prior to debridement (In Clinic Only). Primary Wound Dressing Wound #1 Left Forearm o Xeroform Secondary Dressing Wound #1 Left Forearm o Conform/Kerlix o Non-adherent pad Dressing Change Frequency Wound #1 Left Forearm o Change dressing every other day. Follow-up Appointments Wound #1 Left Forearm o Return Appointment in 1 week. Additional Orders / Instructions Wound #1 Left Forearm o Vitamin A; Vitamin C, Zinc - Please add these over the counter supplements for wound healing o Increase protein intake. o  Activity as tolerated Patient Medications Allergies: tetracycline, Septra, Shellfish Containing Products, Sulfa (Sulfonamide Antibiotics), colestipol, Zetia Notifications Medication Indication Start End Larry Crawford, Larry Crawford ([id number]) Notifications Medication Indication Start End lidocaine DOSE 1 - topical 4 % cream - 1 cream topical Electronic Signature(s) Signed: 01/07/2018 4:06:48 PM By: Alric Quan Signed: 01/07/2018 5:27:42 PM By: Worthy Keeler PA-C Entered By: Alric Quan on 01/07/2018 13:17:53 Larry Crawford, Larry Crawford ([id number]) -------------------------------------------------------------------------------- Prescription 01/07/2018 Patient Name: DAISY, MCNEEL. Provider: Worthy Keeler PA-C Date of Birth: 09/21/1941 NPI#: [id number] Sex: Valeta Harms: [id number] Phone #: [phone] License #: Patient Address: [address] [place], [place] [address] [address] [place],  [address] [phone] Allergies tetracycline Septra Shellfish Containing Products Sulfa (Sulfonamide Antibiotics) colestipol Zetia Medication Medication: Route: Strength: Form: lidocaine 4 % topical cream topical 4% cream Class: TOPICAL LOCAL ANESTHETICS Dose: Frequency / Time: Indication: 1 1 cream topical Number of Refills: Number of Units: 0 Generic Substitution: Start Date: End Date: One Time Use: Substitution Permitted No Note to Pharmacy: Signature(s): Date(s): CHAUN, UEMURA ([id number]) Electronic Signature(s) Signed: 01/07/2018 4:06:48 PM By: Alric Quan Signed: 01/07/2018 5:27:42 PM By: Worthy Keeler PA-C Entered By: Alric Quan on 01/07/2018 13:17:53 ARRAN, FESSEL ([id number]) --------------------------------------------------------------------------------  Problem List Details Patient Name: JOHNSTON, MADDOCKS. Date of Service: 01/07/2018 1:00 PM Medical  Record Number: [id number] Patient Account Number: [id number] Date of Birth/Sex: [birth year] (78 y.o. 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 Date of Service: 01/07/2018 1:00 PM Medical Record Number: [id number] Patient Account Number: [id number] Date of Birth/Sex: 1941/02/24 (77 y.o. Male) Treating RN: Ahmed Prima Primary Care Provider: Lelon Huh Other Clinician: Referring Provider: Lelon Huh Treating Provider/Extender: Melburn Hake, Awab Abebe Weeks in Treatment: 1 Subjective Chief Complaint Information obtained from Patient Left arm dog bite 12/27/17 History of Present Illness (HPI) 12/31/17 on evaluation today patient arrives in our clinic for initial evaluation concerning a dog bite to the left forearm which occurred this past Friday 12/27/17. He states he was in the park when a large dog ran up to him and bit him on the forearm through his coat. This caused a fairly substantial skin tear as well is a puncture wound on the lateral aspect. The dog as well as the owner then ran off and he was unable to catch the individual to obtain any information. He was seen in the ER that day and they subsequently placed him on antibiotic therapy, Augmentin, provided a tetanus booster since the patient  was not sure of his last booster, and also initiated rabies vaccinations which he has been undergoing. This was due to the fact that they were assuming the dog did not have its proper vaccinations. Since the owner ran off. Patient/hemoglobin A1c was 5.7 and this was in February 2018 he has not had this check this year. He has no diagnosis of formal diabetes. Patient has no other major medical problems that would significantly impact his healing ability. Currently just a telfa pad was placed on the wound and this was covered with a dressing until he came in to see [place] today. 01/07/18 on evaluation today patient's left form actually seems to be doing very well. He is almost done with his rabies vaccination series and the ulcer on his left forearm is showing excellent signs of epithelialization. In general I am happy with the progress that has been made over the past week. Patient states he still has discomfort but not nearly as much as noted during last week's evaluation. Patient History Information obtained from Patient. Family History Cancer - Mother,Siblings, Thyroid Problems - Siblings, No family history of Diabetes, Heart Disease, Hereditary Spherocytosis, Hypertension, Kidney Disease, Lung Disease, Seizures, Stroke, Tuberculosis. Social History Former smoker - quit early 17s, Marital Status - Married, Alcohol Use - Rarely, Drug Use - No History, Caffeine Use - Daily. Review of Systems (ROS) Constitutional Symptoms (General Health) Denies complaints or symptoms of Fever, Chills. Respiratory The patient has no complaints or symptoms. Cardiovascular The patient has no complaints or symptoms. Psychiatric The patient has no complaints or symptoms. Larry Crawford, Larry Crawford. ([id number]) Objective Constitutional Well-nourished and well-hydrated in no acute distress. Vitals Time Taken: 1:01 PM, Height: 68 in, Weight: 198 lbs, BMI: 30.1, Temperature: 98.3 F, Pulse: 65 bpm, Respiratory Rate: 18  breaths/min, Blood Pressure: 145/64 mmHg. Respiratory normal breathing without difficulty. clear to auscultation bilaterally. Cardiovascular regular rate and rhythm with normal S1, S2. Psychiatric this patient is able to make decisions and demonstrates good insight into disease process. Alert and Oriented x 3. pleasant and cooperative. General Notes: Patient has excellent granulation as well as good epithelialization noted in regard to his wound at this time. The wound shows no evidence of slough covering currently that cannot be cleaned off with saline and gauze and in the end everything appears to be doing very well. Integumentary (Hair, Skin) Wound #1 status is Open. Original cause of wound was Bite. The wound is located on the Left  Forearm. The wound measures 4.9cm length x 3.9cm width x 0.1cm depth; 15.009cm^2 area and 1.501cm^3 volume. There is no tunneling or undermining noted. There is a large amount of serosanguineous drainage noted. The wound margin is distinct with the outline attached to the wound base. There is large (67-100%) red granulation within the wound bed. There is a small (1-33%) amount of necrotic tissue within the wound bed including Eschar and Adherent Slough. The periwound skin appearance exhibited: Maceration, Erythema. The periwound skin appearance did not exhibit: Callus, Crepitus, Excoriation, Induration, Rash, Scarring, Dry/Scaly, Atrophie Blanche, Cyanosis, Ecchymosis, Hemosiderin Staining, Mottled, Pallor, Rubor. The surrounding wound skin color is noted with erythema which is circumferential. Periwound temperature was noted as No Abnormality. The periwound has tenderness on palpation. Assessment Active Problems ICD-10 W54.0XXA - Bitten by dog, initial encounter [phone] - 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 layer exposed [place] ([id number]) Wound Cleansing: Wound #1 Left Forearm: Clean wound with Normal Saline. May  Shower, gently pat wound dry prior to applying new dressing. Anesthetic (add to Medication List): Wound #1 Left Forearm: Topical Lidocaine 4% cream applied to wound bed prior to debridement (In Clinic Only). Primary Wound Dressing: Wound #1 Left Forearm: Xeroform Secondary Dressing: Wound #1 Left Forearm: Conform/Kerlix Non-adherent pad Dressing Change Frequency: Wound #1 Left Forearm: Change dressing every other day. Follow-up Appointments: Wound #1 Left Forearm: Return Appointment in 1 week. Additional Orders / Instructions: Wound #1 Left Forearm: Vitamin A; Vitamin C, Zinc - Please add these over the counter supplements for wound healing Increase protein intake. Activity as tolerated The following medication(s) was prescribed: lidocaine topical 4 % cream 1 1 cream topical was prescribed at facility I'm going to recommend that we actually continue with addressing the ulcer for the next week although we are going to go with Xeroform gauze along with Kerlex in [place] to hold everything in place. Hopefully this will continue to show signs of good improvement over the next week. In fact this may even be healed if not very close to healed in one weeks time. Patient is in agreement with continuing with these updated wound care measures since he is doing so well. Please see above for specific wound care orders. We will see patient for re-evaluation in 1 week(s) here in the clinic. If anything worsens or changes patient will contact our office for additional recommendations. Electronic Signature(s) Signed: 01/07/2018 5:27:42 PM By: Worthy Keeler PA-C Entered By: Worthy Keeler on 01/07/2018 17:15:05 PAYSON, EVRARD ([id number]) -------------------------------------------------------------------------------- ROS/PFSH Details Patient Name: CLEMONS, SALVUCCI. Date of Service: 01/07/2018 1:00 PM Medical Record Number: [id number] Patient Account Number: [id number] Date of Birth/Sex:  September 12, 1941 (77 y.o. Male) Treating RN: Ahmed Prima Primary Care Provider: Lelon Huh Other Clinician: Referring Provider: Lelon Huh Treating Provider/Extender: Melburn Hake, Italia Wolfert Weeks in Treatment: 1 Information Obtained From Patient Wound History Do you currently have one or more open woundso Yes How many open wounds do you currently haveo 1 Approximately how long have you had your woundso 5 days How have you been treating your wound(s) until nowo dressing from ER Has your wound(s) ever healed and then re-openedo No Have you had any lab work done in the past montho No Have you tested positive for an antibiotic resistant organism (MRSA, VRE)o No Have you tested positive for osteomyelitis (bone infection)o No Have you had any tests for circulation on your legso No Constitutional Symptoms (General Health) Complaints and Symptoms: Negative for: Fever; Chills Eyes  Medical History: Negative for: Cataracts; Glaucoma; Optic Neuritis Respiratory Complaints and Symptoms: No Complaints or Symptoms Cardiovascular Complaints and Symptoms: No Complaints or Symptoms Musculoskeletal Medical History: Positive for: Gout; Osteoarthritis Oncologic Medical History: Negative for: Received Chemotherapy; Received Radiation Psychiatric Complaints and Symptoms: No Complaints or Symptoms Immunizations NAIF, ALABI ([id number]) Pneumococcal Vaccine: Received Pneumococcal Vaccination: Yes Implantable Devices Family and Social History Cancer: Yes - Mother,Siblings; Diabetes: No; Heart Disease: No; Hereditary Spherocytosis: No; Hypertension: No; Kidney Disease: No; Lung Disease: No; Seizures: No; Stroke: No; Thyroid Problems: Yes - Siblings; Tuberculosis: No; Former smoker - quit early 3s; Marital Status - Married; Alcohol Use: Rarely; Drug Use: No History; Caffeine Use: Daily; Financial Concerns: No; Food, Clothing or Shelter Needs: No; Support System Lacking: No; Transportation  Concerns: No; Advanced Directives: No; Patient does not want information on Advanced Directives; Do not resuscitate: No; Living Will: No; Medical Power of Attorney: No Physician Affirmation I have reviewed and agree with the above information. Electronic Signature(s) Signed: 01/07/2018 5:27:42 PM By: Worthy Keeler PA-C Signed: 01/10/2018 8:00:35 AM By: Alric Quan Entered By: Worthy Keeler on 01/07/2018 17:13:05 BOHDAN, MACHO ([id number]) -------------------------------------------------------------------------------- SuperBill Details Patient Name: EILAM, [place]. Date of Service: 01/07/2018 Medical Record Number: [id number] Patient Account Number: [id number] Date of Birth/Sex: September 21, 1941 (77 y.o. Male) Treating RN: Ahmed Prima Primary Care Provider: Lelon Huh Other Clinician: Referring Provider: Lelon Huh Treating Provider/Extender: Melburn Hake, Karolyn Messing Weeks in Treatment: 1 Diagnosis Coding ICD-10 Codes Code Description W54.0XXA Bitten by dog, initial encounter [phone] 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 layer exposed Facility Procedures CPT4 Code: [id number] Description: 99213 - WOUND CARE VISIT-LEV 3 EST PT Modifier: Quantity: 1 Physician Procedures CPT4 Code: 0240973 Description: 53299 - WC PHYS LEVEL 3 - EST PT ICD-10 Diagnosis Description W54.0XXA Bitten by dog, initial encounter [phone] 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 l Modifier: ayer exposed Quantity: 1 Electronic Signature(s) Signed: 01/07/2018 5:27:42 PM By: Worthy Keeler PA-C Entered By: Worthy Keeler on 01/07/2018 17:15:18

## 2018-01-14 ENCOUNTER — Encounter: Payer: Medicare Other | Admitting: Physician Assistant

## 2018-01-14 DIAGNOSIS — L98492 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 layer exposed: Secondary | ICD-10-CM | POA: Diagnosis not present

## 2018-01-14 DIAGNOSIS — Z87891 Personal history of nicotine dependence: Secondary | ICD-10-CM | POA: Diagnosis not present

## 2018-01-14 DIAGNOSIS — M109 Gout, unspecified: Secondary | ICD-10-CM | POA: Diagnosis not present

## 2018-01-14 DIAGNOSIS — L98499 Non-pressure chronic ulcer of skin of other sites with unspecified severity: Secondary | ICD-10-CM | POA: Diagnosis not present

## 2018-01-15 DIAGNOSIS — R739 Hyperglycemia, unspecified: Secondary | ICD-10-CM | POA: Diagnosis not present

## 2018-01-15 DIAGNOSIS — E782 Mixed hyperlipidemia: Secondary | ICD-10-CM | POA: Diagnosis not present

## 2018-01-16 ENCOUNTER — Telehealth: Payer: Self-pay | Admitting: *Deleted

## 2018-01-16 LAB — LIPID PANEL
Chol/HDL Ratio: 6.5 ratio — ABNORMAL HIGH (ref 0.0–5.0)
Cholesterol, Total: 233 mg/dL — ABNORMAL HIGH (ref 100–199)
HDL: 36 mg/dL — ABNORMAL LOW
LDL Calculated: 155 mg/dL — ABNORMAL HIGH (ref 0–99)
Triglycerides: 210 mg/dL — ABNORMAL HIGH (ref 0–149)
VLDL Cholesterol Cal: 42 mg/dL — ABNORMAL HIGH (ref 5–40)

## 2018-01-16 LAB — HEMOGLOBIN A1C
Est. average glucose Bld gHb Est-mCnc: 123 mg/dL
Hgb A1c MFr Bld: 5.9 % — ABNORMAL HIGH (ref 4.8–5.6)

## 2018-01-16 NOTE — Telephone Encounter (Signed)
LMOVM for pt to return call 

## 2018-01-16 NOTE — Telephone Encounter (Signed)
-----   Message from Birdie Sons, MD sent at 01/16/2018  7:57 AM EST ----- Cholesterol still high at 233, but much better, was 264 last time. Average blood sugar slightly high at 123, but not diabetec. Continue current medications.  Check yearly.

## 2018-01-16 NOTE — Progress Notes (Signed)
Larry, Crawford ([id number]) Visit Report for 01/14/2018 Chief Complaint Document Details Patient Name: Larry Crawford, Larry Crawford. Date of Service: 01/14/2018 2:00 PM Medical Record Number: [id number] Patient Account Number: [id number] Date of Birth/Sex: [birth year] (77 y.o. Male) Treating RN: Ahmed Prima Primary Care Provider: Lelon Huh Other Clinician: Referring Provider: Lelon Huh Treating Provider/Extender: Melburn Hake, HOYT Weeks in Treatment: 2 Information Obtained from: Patient Chief Complaint Left arm dog bite 12/27/17 Electronic Signature(s) Signed: 01/14/2018 5:35:20 PM By: Worthy Keeler PA-C Entered By: Worthy Keeler on 01/14/2018 14:04:58 ILIJAH, DOUCET ([id number]) -------------------------------------------------------------------------------- HPI Details Patient Name: [name], Crawford. Date of Service: 01/14/2018 2:00 PM Medical Record Number: [id number] Patient Account Number: [id number] Date of Birth/Sex: 1941/09/16 (77 y.o. Male) Treating RN: Ahmed Prima Primary Care Provider: Lelon Huh Other Clinician: Referring Provider: Lelon Huh Treating Provider/Extender: Melburn Hake, HOYT Weeks in Treatment: 2 History of Present Illness HPI Description: 12/31/17 on evaluation today patient arrives in our clinic for initial evaluation concerning a dog bite to the left forearm which occurred this past Friday 12/27/17. He states he was in the park when a large dog ran up to him and bit him on the forearm through his coat. This caused a fairly substantial skin tear as well is a puncture wound on the lateral aspect. The dog as well as the owner then ran off and he was unable to catch the individual to obtain any information. He was seen in the ER that day and they subsequently placed him on antibiotic therapy, Augmentin, provided a tetanus booster since the patient was not sure of his last booster, and also initiated rabies vaccinations which he has been undergoing. This  was due to the fact that they were assuming the dog did not have its proper vaccinations. Since the owner ran off. Patient/hemoglobin A1c was 5.7 and this was in February 2018 he has not had this check this year. He has no diagnosis of formal diabetes. Patient has no other major medical problems that would significantly impact his healing ability. Currently just a telfa pad was placed on the wound and this was covered with a dressing until he came in to see [place] today. 01/07/18 on evaluation today patient's left form actually seems to be doing very well. He is almost done with his rabies vaccination series and the ulcer on his left forearm is showing excellent signs of epithelialization. In general I am happy with the progress that has been made over the past week. Patient states he still has discomfort but not nearly as much as noted during last week's evaluation. 01/14/18 on evaluation today patient appears to be doing decently well in regard to his left form. He is having no significant discomfort. Although he does still have some pain. In fact the wound appears to be completely healed from the standpoint of their being epithelium covering the entire region. With that being said obviously there still some healing internally yet to be done and I believe this is why he's continue to be somewhat sore. Electronic Signature(s) Signed: 01/14/2018 5:35:20 PM By: Worthy Keeler PA-C Entered By: Worthy Keeler on 01/14/2018 16:47:19 TAHJIR, SILVERIA ([id number]) -------------------------------------------------------------------------------- Physical Exam Details Patient Name: Larry, Crawford. Date of Service: 01/14/2018 2:00 PM Medical Record Number: [id number] Patient Account Number: [id number] Date of Birth/Sex: Sep 08, 1941 (77 y.o. Male) Treating RN: Ahmed Prima Primary Care Provider: Lelon Huh Other Clinician: Referring Provider: Lelon Huh Treating Provider/Extender: Melburn Hake,  HOYT Weeks in Treatment: 2 Constitutional Well-nourished and well-hydrated  in no acute distress. Respiratory normal breathing without difficulty. Psychiatric this patient is able to make decisions and demonstrates good insight into disease process. Alert and Oriented x 3. pleasant and cooperative. Notes Patient has no open wound noted at this point in time which is excellent news in regard to his left forearm. He does have very new skin which I do think needs to be protected at this time. Electronic Signature(s) Signed: 01/14/2018 5:35:20 PM By: Worthy Keeler PA-C Entered By: Worthy Keeler on 01/14/2018 16:47:55 RION, CATALA ([id number]) -------------------------------------------------------------------------------- Physician Orders Details Patient Name: Larry, Crawford. Date of Service: 01/14/2018 2:00 PM Medical Record Number: [id number] Patient Account Number: [id number] Date of Birth/Sex: 1941/04/24 (77 y.o. Male) Treating RN: Ahmed Prima Primary Care Provider: Lelon Huh Other Clinician: Referring Provider: Lelon Huh Treating Provider/Extender: Melburn Hake, HOYT Weeks in Treatment: 2 Verbal / Phone Orders: Yes Clinician: Carolyne Fiscal, Debi Read Back and Verified: Yes Diagnosis Coding ICD-10 Coding Code Description W54.0XXA Bitten by dog, initial encounter [phone] 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 layer exposed Discharge From [facility] Services Wound #1 Left Forearm o Discharge from [place] - Please call our office if you have any questions or concerns. Electronic Signature(s) Signed: 01/14/2018 5:35:20 PM By: Worthy Keeler PA-C Signed: 01/15/2018 4:30:35 PM By: Alric Quan Entered By: Alric Quan on 01/14/2018 14:56:38 OLIVERIO, CHO ([id number]) -------------------------------------------------------------------------------- Problem List Details Patient Name: Larry, Crawford. Date of Service: 01/14/2018 2:00  PM Medical Record Number: [id number] Patient Account Number: [id number] Date of Birth/Sex: 12/01/1940 (77 y.o. 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 Date of Service: 01/14/2018 2:00 PM Medical Record Number: [id number] Patient Account Number: [id number] Date of Birth/Sex: 09/01/1941 (77 y.o. Male) Treating RN: Ahmed Prima Primary Care Provider: Lelon Huh Other Clinician: Referring Provider: Lelon Huh Treating Provider/Extender: Melburn Hake, HOYT Weeks in Treatment: 2 Subjective Chief Complaint Information obtained from Patient Left arm dog bite 12/27/17 History of Present Illness (HPI) 12/31/17 on evaluation today patient arrives in our clinic for initial evaluation concerning a dog bite to the left forearm which occurred this past Friday 12/27/17. He states he was in the park when a large dog ran up to him and bit him on the forearm through his coat. This caused a fairly substantial skin tear as well is a puncture wound on the lateral aspect. The dog as well as the owner then ran off and he was unable to catch the individual to obtain any information. He was seen in the ER that day and they subsequently placed him on antibiotic therapy, Augmentin, provided a tetanus booster since  the patient was not sure of his last booster, and also initiated rabies vaccinations which he has been undergoing. This was due to the fact that they were assuming the dog did not have its proper vaccinations. Since the owner ran off. Patient/hemoglobin A1c was 5.7 and this was in February 2018 he has not had this check this year. He has  no diagnosis of formal diabetes. Patient has no other major medical problems that would significantly impact his healing ability. Currently just a telfa pad was placed on the wound and this was covered with a dressing until he came in to see [place] today. 01/07/18 on evaluation today patient's left form actually seems to be doing very well. He is almost done with his rabies vaccination series and the ulcer on his left forearm is showing excellent signs of epithelialization. In general I am happy with the progress that has been made over the past week. Patient states he still has discomfort but not nearly as much as noted during last week's evaluation. 01/14/18 on evaluation today patient appears to be doing decently well in regard to his left form. He is having no significant discomfort. Although he does still have some pain. In fact the wound appears to be completely healed from the standpoint of their being epithelium covering the entire region. With that being said obviously there still some healing internally yet to be done and I believe this is why he's continue to be somewhat sore. Patient History Information obtained from Patient. Family History Cancer - Mother,Siblings, Thyroid Problems - Siblings, No family history of Diabetes, Heart Disease, Hereditary Spherocytosis, Hypertension, Kidney Disease, Lung Disease, Seizures, Stroke, Tuberculosis. Social History Former smoker - quit early 64s, Marital Status - Married, Alcohol Use - Rarely, Drug Use - No History, Caffeine Use - Daily. Review of Systems (ROS) Constitutional Symptoms (General Health) The patient  has no complaints or symptoms. Psychiatric The patient has no complaints or symptoms. JOVEN, MOM. ([id number]) Objective Constitutional Well-nourished and well-hydrated in no acute distress. Vitals Time Taken: 2:17 PM, Height: 68 in, Weight: 198 lbs, BMI: 30.1, Temperature: 97.8 F, Pulse: 80 bpm, Respiratory Rate: 16 breaths/min, Blood Pressure: 129/64 mmHg. Respiratory normal breathing without difficulty. Psychiatric this patient is able to make decisions and demonstrates good insight into disease process. Alert and Oriented x 3. pleasant and cooperative. General Notes: Patient has no open wound noted at this point in time which is excellent news in regard to his left forearm. He does have very new skin which I do think needs to be protected at this time. Integumentary (Hair, Skin) Wound #1 status is Healed - Epithelialized. Original cause of wound was Bite. The wound is located on the Left Forearm. The wound measures 0cm length x 0cm width x 0cm depth; 0cm^2 area and 0cm^3 volume. There is no tunneling or undermining noted. There is a none present amount of drainage noted. The wound margin is distinct with the outline attached to the wound base. There is no granulation within the wound bed. There is no necrotic tissue within the wound bed. The periwound skin appearance exhibited: Erythema. The periwound skin appearance did not exhibit: Callus, Crepitus, Excoriation, Induration, Rash, Scarring, Dry/Scaly, Maceration, Atrophie Blanche, Cyanosis, Ecchymosis, Hemosiderin Staining, Mottled, Pallor, Rubor. The surrounding wound skin color is noted with erythema which is circumferential. Periwound temperature was noted as No Abnormality. The periwound has tenderness on palpation. Assessment Active Problems ICD-10 W54.0XXA - Bitten by dog, initial encounter [phone] - 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 layer exposed Plan Discharge From [facility] Services: Wound #1  Left Forearm: Discharge from [place] - Please call our office if you have any questions or concerns. TRINIDAD, PETRON. ([id number]) Currently I'm going to recommend that we discontinue wound care services patient appears to be completely healed this is excellent news. If anything worsens in  the future or reopens and he needs to see [place] will be more than happy to see him otherwise hopefully with protecting this it will subsequently continue to heal internally and firm up as far as the new skin growth is concerned. Patient is in agreement with the plan. Electronic Signature(s) Signed: 01/14/2018 5:35:20 PM By: Worthy Keeler PA-C Entered By: Worthy Keeler on 01/14/2018 16:48:37 BONIFACIO, PRUDEN ([id number]) -------------------------------------------------------------------------------- ROS/PFSH Details Patient Name: DIANNA, DESHLER. Date of Service: 01/14/2018 2:00 PM Medical Record Number: [id number] Patient Account Number: [id number] Date of Birth/Sex: 01-08-1941 (77 y.o. Male) Treating RN: Ahmed Prima Primary Care Provider: Lelon Huh Other Clinician: Referring Provider: Lelon Huh Treating Provider/Extender: Melburn Hake, HOYT Weeks in Treatment: 2 Information Obtained From Patient Wound History Do you currently have one or more open woundso Yes How many open wounds do you currently haveo 1 Approximately how long have you had your woundso 5 days How have you been treating your wound(s) until nowo dressing from ER Has your wound(s) ever healed and then re-openedo No Have you had any lab work done in the past montho No Have you tested positive for an antibiotic resistant organism (MRSA, VRE)o No Have you tested positive for osteomyelitis (bone infection)o No Have you had any tests for circulation on your legso No Constitutional Symptoms (General Health) Complaints and Symptoms: No Complaints or Symptoms Eyes Medical History: Negative for: Cataracts; Glaucoma;  Optic Neuritis Musculoskeletal Medical History: Positive for: Gout; Osteoarthritis Oncologic Medical History: Negative for: Received Chemotherapy; Received Radiation Psychiatric Complaints and Symptoms: No Complaints or Symptoms Immunizations Pneumococcal Vaccine: Received Pneumococcal Vaccination: Yes Implantable Devices Family and Social History Cancer: Yes - Mother,Siblings; Diabetes: No; Heart Disease: No; Hereditary Spherocytosis: No; Hypertension: No; Kidney Disease: No; Lung Disease: No; Seizures: No; Stroke: No; Thyroid Problems: Yes - Siblings; Tuberculosis: No; Former smoker - quit early 71s; Marital Status - Married; Alcohol Use: Rarely; Drug Use: No History; Caffeine Use: Daily; Financial Concerns: No; Food, Clothing or Shelter Needs: No; Support System Lacking: No; Transportation Concerns: No; [place] ([id number]) Directives: No; Patient does not want information on Advanced Directives; Do not resuscitate: No; Living Will: No; Medical Power of Attorney: No Physician Affirmation I have reviewed and agree with the above information. Electronic Signature(s) Signed: 01/14/2018 5:35:20 PM By: Worthy Keeler PA-C Signed: 01/15/2018 4:30:35 PM By: Alric Quan Entered By: Worthy Keeler on 01/14/2018 16:47:35 ARDON, FRANKLIN ([id number]) -------------------------------------------------------------------------------- SuperBill Details Patient Name: HASON, OFARRELL. Date of Service: 01/14/2018 Medical Record Number: [id number] Patient Account Number: [id number] Date of Birth/Sex: [birth year] (77 y.o. Male) Treating RN: Ahmed Prima Primary Care Provider: Lelon Huh Other Clinician: Referring Provider: Lelon Huh Treating Provider/Extender: Melburn Hake, HOYT Weeks in Treatment: 2 Diagnosis Coding ICD-10 Codes Code Description W54.0XXA Bitten by dog, initial encounter [phone] 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 layer  exposed Facility Procedures CPT4 Code: [id number] Description: [id number] - WOUND CARE VISIT-LEV 2 EST PT Modifier: Quantity: 1 Physician Procedures CPT4 Code: [id number] Description: 70350 - WC PHYS LEVEL 2 - EST PT ICD-10 Diagnosis Description W54.0XXA Bitten by dog, initial encounter [phone] 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 l Modifier: ayer exposed Quantity: 1 Electronic Signature(s) Signed: 01/14/2018 5:35:20 PM By: Worthy Keeler PA-C Previous Signature: 01/14/2018 4:24:48 PM Version By: Alric Quan Entered By: Worthy Keeler on 01/14/2018 16:48:57

## 2018-01-16 NOTE — Progress Notes (Signed)
Larry Crawford ([id number]) Visit Report for 01/14/2018 Arrival Information Details Patient Name: Larry Crawford, Larry Crawford. Date of Service: 01/14/2018 2:00 PM Medical Record Number: [id number] Patient Account Number: [id number] Date of Birth/Sex: [birth year] (77 y.o. Male) Treating RN: Larry Crawford Primary Care Larry Crawford: Larry Crawford Other Clinician: Referring Larry Crawford: Larry Crawford Treating Larry Crawford: Larry Crawford Weeks in Treatment: 2 Visit Information History Since Last Visit Added or deleted any medications: No Patient Arrived: Ambulatory Any new allergies or adverse reactions: No Arrival Time: 14:12 Had a fall or experienced change in No Accompanied By: self activities of daily living that may affect Transfer Assistance: None risk of falls: Patient Identification Verified: Yes Signs or symptoms of abuse/neglect since last visito No Secondary Verification Process Completed: Yes Hospitalized since last visit: No Has Dressing in Place as Prescribed: Yes Pain Present Now: No Electronic Signature(s) Signed: 01/14/2018 4:46:41 PM By: Larry Crawford Entered By: Larry Crawford on 01/14/2018 14:18:12 Larry Crawford ([id number]) -------------------------------------------------------------------------------- Clinic Level of Care Assessment Details Patient Name: Larry Crawford, Larry Crawford. Date of Service: 01/14/2018 2:00 PM Medical Record Number: [id number] Patient Account Number: [id number] Date of Birth/Sex: 03/21/1941 (77 y.o. Male) Treating RN: Larry Crawford Primary Care Lunetta Marina: Larry Crawford Other Clinician: Referring Larry Crawford: Larry Crawford Treating Larry Crawford: Larry Crawford Weeks in Treatment: 2 Clinic Level of Care Assessment Items TOOL 4 Quantity Score X - Use when only an EandM is performed on FOLLOW-UP visit 1 0 ASSESSMENTS - Nursing Assessment / Reassessment []  - Reassessment of Co-morbidities (includes updates in patient status) 0 []  - 0 Reassessment  of Adherence to Treatment Plan ASSESSMENTS - Wound and Skin Assessment / Reassessment X - Simple Wound Assessment / Reassessment - one wound 1 5 []  - 0 Complex Wound Assessment / Reassessment - multiple wounds []  - 0 Dermatologic / Skin Assessment (not related to wound area) ASSESSMENTS - Focused Assessment []  - Circumferential Edema Measurements - multi extremities 0 []  - 0 Nutritional Assessment / Counseling / Intervention []  - 0 Lower Extremity Assessment (monofilament, tuning fork, pulses) []  - 0 Peripheral Arterial Disease Assessment (using hand held doppler) ASSESSMENTS - Ostomy and/or Continence Assessment and Care []  - Incontinence Assessment and Management 0 []  - 0 Ostomy Care Assessment and Management (repouching, etc.) PROCESS - Coordination of Care X - Simple Patient / Family Education for ongoing care 1 15 []  - 0 Complex (extensive) Patient / Family Education for ongoing care []  - 0 Staff obtains Programmer, systems, Records, Test Results / Process Orders []  - 0 Staff telephones HHA, Nursing Homes / Clarify orders / etc []  - 0 Routine Transfer to another Facility (non-emergent condition) []  - 0 Routine Hospital Admission (non-emergent condition) []  - 0 New Admissions / Biomedical engineer / Ordering NPWT, Apligraf, etc. []  - 0 Emergency Hospital Admission (emergent condition) X- 1 10 Simple Discharge Coordination Larry Crawford, Larry Crawford ([id number]) []  - 0 Complex (extensive) Discharge Coordination PROCESS - Special Needs []  - Pediatric / Minor Patient Management 0 []  - 0 Isolation Patient Management []  - 0 Hearing / Language / Visual special needs []  - 0 Assessment of Community assistance (transportation, D/C planning, etc.) []  - 0 Additional assistance / Altered mentation []  - 0 Support Surface(s) Assessment (bed, cushion, seat, etc.) INTERVENTIONS - Wound Cleansing / Measurement X - Simple Wound Cleansing - one wound 1 5 []  - 0 Complex Wound Cleansing -  multiple wounds X- 1 5 Wound Imaging (photographs - any number of wounds) []  - 0 Wound Tracing (instead of photographs) []  - 0 Simple Wound  Measurement - one wound []  - 0 Complex Wound Measurement - multiple wounds INTERVENTIONS - Wound Dressings []  - Small Wound Dressing one or multiple wounds 0 []  - 0 Medium Wound Dressing one or multiple wounds []  - 0 Large Wound Dressing one or multiple wounds []  - 0 Application of Medications - topical []  - 0 Application of Medications - injection INTERVENTIONS - Miscellaneous []  - External ear exam 0 []  - 0 Specimen Collection (cultures, biopsies, blood, body fluids, etc.) []  - 0 Specimen(s) / Culture(s) sent or taken to Lab for analysis []  - 0 Patient Transfer (multiple staff / Harrel Lemon Lift / Similar devices) []  - 0 Simple Staple / Suture removal (25 or less) []  - 0 Complex Staple / Suture removal (26 or more) []  - 0 Hypo / Hyperglycemic Management (close monitor of Blood Glucose) []  - 0 Ankle / Brachial Index (ABI) - do not check if billed separately X- 1 5 Vital Signs Larry Crawford, Larry Crawford. ([id number]) Has the patient been seen at the hospital within the last three years: Yes Total Score: 45 Level Of Care: New/Established - Level 2 Electronic Signature(s) Signed: 01/15/2018 4:30:35 PM By: Larry Crawford Entered By: Larry Crawford on 01/14/2018 16:24:42 Larry Crawford ([id number]) -------------------------------------------------------------------------------- Encounter Discharge Information Details Patient Name: Larry Crawford. Date of Service: 01/14/2018 2:00 PM Medical Record Number: [id number] Patient Account Number: [id number] Date of Birth/Sex: [birth year] (77 y.o. Male) Treating RN: Larry Crawford Primary Care Kamyah Wilhelmsen: Larry Crawford Other Clinician: Referring Egbert Seidel: Larry Crawford Treating Odaliz Mcqueary/Extender: Larry Crawford Weeks in Treatment: 2 Encounter Discharge Information Items Discharge Pain Level:  0 Discharge Condition: Stable Ambulatory Status: Ambulatory Discharge Destination: Home Transportation: Private Auto Accompanied By: self Schedule Follow-up Appointment: No Medication Reconciliation completed and No provided to Patient/Care Haila Dena: Provided on Clinical Summary of Care: 01/14/2018 Form Type Recipient Paper Patient RB Electronic Signature(s) Signed: 01/15/2018 9:38:13 AM By: Ruthine Dose Entered By: Ruthine Dose on 01/14/2018 15:03:22 Larry Crawford, Larry Crawford ([id number]) -------------------------------------------------------------------------------- Lower Extremity Assessment Details Patient Name: BARD, HAUPERT. Date of Service: 01/14/2018 2:00 PM Medical Record Number: [id number] Patient Account Number: [id number] Date of Birth/Sex: 1941-06-29 (77 y.o. Male) Treating RN: Larry Crawford Primary Care Athen Riel: Larry Crawford Other Clinician: Referring Ira Dougher: Larry Crawford Treating Stellar Gensel/Extender: Larry Crawford Weeks in Treatment: 2 Electronic Signature(s) Signed: 01/14/2018 4:46:41 PM By: Larry Crawford Entered By: Larry Crawford on 01/14/2018 14:22:11 Larry Crawford, Larry Crawford ([id number]) -------------------------------------------------------------------------------- Multi Wound Chart Details Patient Name: MURLE, OTTING. Date of Service: 01/14/2018 2:00 PM Medical Record Number: [id number] Patient Account Number: [id number] Date of Birth/Sex: Feb 16, 1941 (77 y.o. Male) Treating RN: Larry Crawford Primary Care Taisa Deloria: Larry Crawford Other Clinician: Referring Czarina Gingras: Larry Crawford Treating Darianna Amy/Extender: Larry Crawford Weeks in Treatment: 2 Vital Signs Height(in): 68 Pulse(bpm): 80 Weight(lbs): 198 Blood Pressure(mmHg): 129/64 Body Mass Index(BMI): 30 Temperature(F): 97.8 Respiratory Rate 16 (breaths/min): Photos: [1:No Photos] [N/A:N/A] Wound Location: [1:Left Forearm] [N/A:N/A] Wounding Event: [1:Bite] [N/A:N/A] Primary Etiology:  [1:Trauma, Other] [N/A:N/A] Comorbid History: [1:Gout, Osteoarthritis] [N/A:N/A] Date Acquired: [1:12/27/2017] [N/A:N/A] Weeks of Treatment: [1:2] [N/A:N/A] Wound Status: [1:Open] [N/A:N/A] Measurements L x W x D [1:0.1x0.1x0.1] [N/A:N/A] (cm) Area (cm) : [1:0.008] [N/A:N/A] Volume (cm) : [1:0.001] [N/A:N/A] % Reduction in Area: [1:100.00%] [N/A:N/A] % Reduction in Volume: [1:99.90%] [N/A:N/A] Classification: [1:Full Thickness Without Exposed Support Structures] [N/A:N/A] Exudate Amount: [1:Small] [N/A:N/A] Exudate Type: [1:Serosanguineous] [N/A:N/A] Exudate Color: [1:red, brown] [N/A:N/A] Wound Margin: [1:Distinct, outline attached] [N/A:N/A] Granulation Amount: [1:Large (67-100%)] [N/A:N/A] Granulation Quality: [1:Red] [N/A:N/A] Necrotic Amount: [1:Small (1-33%)] [N/A:N/A] Necrotic Tissue: [1:Eschar, Adherent  Slough] [N/A:N/A] Exposed Structures: [1:Fascia: No Fat Layer (Subcutaneous Tissue) Exposed: No Tendon: No Muscle: No Joint: No Bone: No] [N/A:N/A] Epithelialization: [1:Large (67-100%)] [N/A:N/A] Periwound Skin Texture: [1:Excoriation: No Induration: No Callus: No Crepitus: No] [N/A:N/A] Rash: No Scarring: No Periwound Skin Moisture: Maceration: Yes N/A N/A Dry/Scaly: No Periwound Skin Color: Erythema: Yes N/A N/A Atrophie Blanche: No Cyanosis: No Ecchymosis: No Hemosiderin Staining: No Mottled: No Pallor: No Rubor: No Erythema Location: Circumferential N/A N/A Temperature: No Abnormality N/A N/A Tenderness on Palpation: Yes N/A N/A Wound Preparation: Ulcer Cleansing: N/A N/A Rinsed/Irrigated with Saline Topical Anesthetic Applied: Other: lidocaine 4% Treatment Notes Electronic Signature(s) Signed: 01/15/2018 4:30:35 PM By: Larry Crawford Entered By: Larry Crawford on 01/14/2018 14:54:31 Larry Crawford, Larry Crawford ([id number]) -------------------------------------------------------------------------------- [place] Details Patient Name:  Larry Crawford, Larry Crawford. Date of Service: 01/14/2018 2:00 PM Medical Record Number: [id number] Patient Account Number: [id number] Date of Birth/Sex: [birth year] (77 y.o. Male) Treating RN: Larry Crawford Primary Care Arianny Pun: Larry Crawford Other Clinician: Referring Fredrick Geoghegan: Larry Crawford Treating Brahim Dolman/Extender: Larry Crawford Weeks in Treatment: 2 Active Inactive Electronic Signature(s) Signed: 01/15/2018 4:30:35 PM By: Larry Crawford Entered By: Larry Crawford on 01/14/2018 14:59:29 Larry Crawford, Larry Crawford ([id number]) -------------------------------------------------------------------------------- Pain Assessment Details Patient Name: Larry Crawford, Larry Crawford. Date of Service: 01/14/2018 2:00 PM Medical Record Number: [id number] Patient Account Number: [id number] Date of Birth/Sex: 05/15/1941 (77 y.o. Male) Treating RN: Larry Crawford Primary Care Annalyce Lanpher: Larry Crawford Other Clinician: Referring Tityana Pagan: Larry Crawford Treating Amberli Ruegg/Extender: Larry Crawford Weeks in Treatment: 2 Active Problems Location of Pain Severity and Description of Pain Patient Has Paino No Site Locations With Dressing Change: Yes Pain Management and Medication Current Pain Management: Electronic Signature(s) Signed: 01/14/2018 4:46:41 PM By: Larry Crawford Entered By: Larry Crawford on 01/14/2018 14:18:22 Larry Crawford ([id number]) -------------------------------------------------------------------------------- Patient/Caregiver Education Details Patient Name: Larry Crawford, Larry Crawford. Date of Service: 01/14/2018 2:00 PM Medical Record Number: [id number] Patient Account Number: [id number] Date of Birth/Gender: 1941-05-07 (77 y.o. Male) Treating RN: Larry Crawford Primary Care Physician: Larry Crawford Other Clinician: Referring Physician: Lelon Crawford Treating Physician/Extender: Sharalyn Ink in Treatment: 2 Education Assessment Education Provided To: Patient Education Topics  Provided Wound/Skin Impairment: Handouts: Other: Please call our office if you have any questions or concerns. Methods: Explain/Verbal Responses: State content correctly Electronic Signature(s) Signed: 01/15/2018 4:30:35 PM By: Larry Crawford Entered By: Larry Crawford on 01/14/2018 15:00:22 Larry Crawford, Larry Crawford ([id number]) -------------------------------------------------------------------------------- Wound Assessment Details Patient Name: Larry Crawford, DAVIDS. Date of Service: 01/14/2018 2:00 PM Medical Record Number: [id number] Patient Account Number: [id number] Date of Birth/Sex: February 07, 1941 (77 y.o. Male) Treating RN: Larry Crawford Primary Care Daylan Boggess: Larry Crawford Other Clinician: Referring Eliani Leclere: Larry Crawford Treating Deyjah Kindel/Extender: Larry Crawford Weeks in Treatment: 2 Wound Status Wound Number: 1 Primary Etiology: Trauma, Other Wound Location: Left Forearm Wound Status: Healed - Epithelialized Wounding Event: Bite Comorbid History: Gout, Osteoarthritis Date Acquired: 12/27/2017 Weeks Of Treatment: 2 Clustered Wound: No Photos Photo Uploaded By: Larry Crawford on 01/14/2018 15:27:07 Wound Measurements Length: (cm) 0 Width: (cm) 0 Depth: (cm) 0 Area: (cm) 0 Volume: (cm) 0 % Reduction in Area: 100% % Reduction in Volume: 100% Epithelialization: Large (67-100%) Tunneling: No Undermining: No Wound Description Full Thickness Without Exposed Support Foul O Classification: Structures Slough Wound Margin: Distinct, outline attached Exudate None Present Amount: dor After Cleansing: No /Fibrino No Wound Bed Granulation Amount: None Present (0%) Exposed Structure Necrotic Amount: None Present (0%) Fascia Exposed: No Fat Layer (Subcutaneous Tissue) Exposed: No Tendon Exposed: No Muscle Exposed: No Joint Exposed:  No Bone Exposed: No Periwound Skin Texture Texture Color EBERARDO, DEMELLO. ([id number]) No Abnormalities Noted: No No Abnormalities  Noted: No Callus: No Atrophie Blanche: No Crepitus: No Cyanosis: No Excoriation: No Ecchymosis: No Induration: No Erythema: Yes Rash: No Erythema Location: Circumferential Scarring: No Hemosiderin Staining: No Mottled: No Moisture Pallor: No No Abnormalities Noted: No Rubor: No Dry / Scaly: No Maceration: No Temperature / Pain Temperature: No Abnormality Tenderness on Palpation: Yes Wound Preparation Ulcer Cleansing: Rinsed/Irrigated with Saline Topical Anesthetic Applied: None Electronic Signature(s) Signed: 01/15/2018 4:30:35 PM By: Larry Crawford Entered By: Larry Crawford on 01/14/2018 14:57:18 Larry Crawford ([id number]) -------------------------------------------------------------------------------- Vitals Details Patient Name: DREXLER, MALAND. Date of Service: 01/14/2018 2:00 PM Medical Record Number: [id number] Patient Account Number: [id number] Date of Birth/Sex: [birth year] (77 y.o. Male) Treating RN: Larry Crawford Primary Care Skarleth Delmonico: Larry Crawford Other Clinician: Referring Otis Portal: Larry Crawford Treating [place]/Extender: Larry Crawford Weeks in Treatment: 2 Vital Signs Time Taken: 14:17 Temperature (F): 97.8 Height (in): 68 Pulse (bpm): 80 Weight (lbs): 198 Respiratory Rate (breaths/min): 16 Body Mass Index (BMI): 30.1 Blood Pressure (mmHg): 129/64 Reference Range: 80 - 120 mg / dl Electronic Signature(s) Signed: 01/14/2018 4:46:41 PM By: Larry Crawford Entered By: Larry Crawford on 01/14/2018 14:18:44

## 2018-01-16 NOTE — Telephone Encounter (Signed)
Patient was notified of results. Expressed understanding.  

## 2018-01-20 ENCOUNTER — Ambulatory Visit (INDEPENDENT_AMBULATORY_CARE_PROVIDER_SITE_OTHER): Payer: Medicare Other | Admitting: Family Medicine

## 2018-01-20 ENCOUNTER — Encounter: Payer: Self-pay | Admitting: Family Medicine

## 2018-01-20 VITALS — BP 132/70 | HR 79 | Temp 97.9°F | Resp 16 | Wt 203.0 lb

## 2018-01-20 DIAGNOSIS — N390 Urinary tract infection, site not specified: Secondary | ICD-10-CM | POA: Diagnosis not present

## 2018-01-20 DIAGNOSIS — K4090 Unilateral inguinal hernia, without obstruction or gangrene, not specified as recurrent: Secondary | ICD-10-CM

## 2018-01-20 DIAGNOSIS — R350 Frequency of micturition: Secondary | ICD-10-CM | POA: Diagnosis not present

## 2018-01-20 DIAGNOSIS — R319 Hematuria, unspecified: Secondary | ICD-10-CM | POA: Diagnosis not present

## 2018-01-20 LAB — POCT URINALYSIS DIPSTICK
Bilirubin, UA: NEGATIVE
Glucose, UA: NEGATIVE
KETONES UA: NEGATIVE
Nitrite, UA: NEGATIVE
PH UA: 7.5 (ref 5.0–8.0)
Protein, UA: NEGATIVE
Spec Grav, UA: <=1.005 — AB (ref 1.010–1.025)
UROBILINOGEN UA: 0.2 U/dL

## 2018-01-20 MED ORDER — NITROFURANTOIN MACROCRYSTAL 100 MG PO CAPS: 100.0000 mg | ORAL_CAPSULE | Freq: Two times a day (BID) | ORAL | 0 refills | Status: DC

## 2018-01-20 NOTE — Progress Notes (Signed)
Patient: Larry Crawford Male    DOB: 02-11-1941   77 y.o.   MRN: [id number] Visit Date: 01/20/2018  Today's Provider: Lelon Huh, MD   Chief Complaint  Patient presents with  . Back Pain  . Groin Pain   Subjective:    HPI Back pain and groin pain: Patient presents eporting that he has had left sided lower back pain and left groin pain for the past 4 days. Patient believes he may have pulled a muscle while working on his mower. Yesterday patient saw white colored penile discharge which has now resolved. No burning or stinging with urination. He does have a history of bilateral inguinal hernia repaired in 1964, and right side repeat hernia surgery in the 1980s.     Allergies  Allergen Reactions  . Tetracyclines & Related Shortness Of Breath and Rash  . Colestipol Hcl     Other reaction(s): Diarrhea Fatigue  . Corn-Containing Products     Diarrhea and "makes my insides hurt"   . Pravastatin Sodium     Other reaction(s): Muscle Pain  . Shellfish Allergy   . Sulfa Antibiotics Hives  . Zetia [Ezetimibe]     Didn't feel good when he was taking it.      Current Outpatient Medications:  .  Acetylcysteine 600 MG CAPS, Take 1 capsule by mouth daily., Disp: , Rfl:  .  allopurinol (ZYLOPRIM) 300 MG tablet, TAKE 1 TABLET DAILY, Disp: 90 tablet, Rfl: 4 .  arginine 500 MG tablet, Take 1 tablet by mouth 2 (two) times daily., Disp: , Rfl:  .  Cholecalciferol (VITAMIN D3) 5000 units CAPS, Take 1 capsule by mouth daily., Disp: , Rfl:  .  fexofenadine (ALLEGRA) 180 MG tablet, Take 180 mg by mouth daily., Disp: , Rfl:  .  GINSENG EXTRACT PO, Take by mouth., Disp: , Rfl:  .  HYDROcodone-acetaminophen (NORCO) 5-325 MG tablet, Take 1 tablet by mouth every 4 (four) hours as needed for moderate pain., Disp: 6 tablet, Rfl: 0 .  INOSITOL PO, Take 600 mg by mouth daily.  , Disp: , Rfl:  .  Kelp 225 MCG TABS, Take 1 tablet by mouth 2 (two) times a week. Takes 2-3 times a week , Disp: , Rfl:    .  Lutein 20 MG CAPS, Take 1 capsule by mouth daily.  , Disp: , Rfl:  .  Multiple Vitamins-Minerals (MULTIVITAMIN WITH MINERALS) tablet, Take 1 tablet by mouth daily.  , Disp: , Rfl:  .  niacin 500 MG tablet, Take 500 mg by mouth at bedtime. , Disp: , Rfl:  .  Probiotic CAPS, Take 1 capsule by mouth daily., Disp: , Rfl:  .  tadalafil (CIALIS) 5 MG tablet, Take 1 tablet (5 mg total) by mouth daily., Disp: 90 tablet, Rfl: 3 .  vitamin B-12 (CYANOCOBALAMIN) 250 MCG tablet, Take 1 tablet by mouth daily., Disp: , Rfl:  .  Whey Protein POWD, Take 4 g by mouth once a week. , Disp: , Rfl:   Current Facility-Administered Medications:  .  0.9 %  sodium chloride infusion, 500 mL, Intravenous, Continuous, Ladene Artist, MD  Review of Systems  Constitutional: Negative for appetite change, chills and fever.  Respiratory: Negative for chest tightness, shortness of breath and wheezing.   Cardiovascular: Negative for chest pain and palpitations.  Gastrointestinal: Negative for abdominal pain, nausea and vomiting.  Genitourinary: Positive for discharge, frequency and scrotal swelling. Negative for enuresis, flank pain and hematuria.  Musculoskeletal:  Positive for back pain.  Skin: Positive for wound (lower left arm from dog bite).    Social History   Tobacco Use  . Smoking status: Former Smoker    Packs/day: 0.75    Years: 5.00    Pack years: 3.75    Types: Cigarettes    Last attempt to quit: 11/19/1958    Years since quitting: 59.2  . Smokeless tobacco: Never Used  Substance Use Topics  . Alcohol use: Yes    Alcohol/week: 0.0 oz    Comment: 1 wine drink a month   Objective:   BP 132/70 (BP Location: Left Arm, Patient Position: Sitting, Cuff Size: Large)   Pulse 79   Temp 97.9 F (36.6 C) (Oral)   Resp 16   Wt 203 lb (92.1 kg)   SpO2 98% Comment: room air  BMI 30.87 kg/m  There were no vitals filed for this visit.   Physical Exam   General Appearance:    Alert, cooperative, no  distress  Eyes:    PERRL, conjunctiva/corneas clear, EOM's intact       Lungs:     Clear to auscultation bilaterally, respirations unlabored  MS:    No back or spine tenderness.   GU:   Left inguinal hernia palpated. Slightly tender to palpation. No CVAT. Marland Kitchen       Results for orders placed or performed in visit on 01/20/18  POCT Urinalysis Dipstick  Result Value Ref Range   Color, UA dark yellow    Clarity, UA clear    Glucose, UA negative    Bilirubin, UA negative    Ketones, UA negative    Spec Grav, UA <=1.005 (A) 1.010 - 1.025   Blood, UA Moderate (non-hemolyzed)    pH, UA 7.5 5.0 - 8.0   Protein, UA negative    Urobilinogen, UA 0.2 0.2 or 1.0 E.U./dL   Nitrite, UA negative    Leukocytes, UA Moderate (2+) (A) Negative   Appearance     Odor         Assessment & Plan:     1. Urinary frequency  - POCT Urinalysis Dipstick  2. Urinary tract infection with hematuria, site unspecified  - Urine Culture - nitrofurantoin (MACRODANTIN) 100 MG capsule; Take 1 capsule (100 mg total) by mouth 2 (two) times daily for 10 days.  Dispense: 20 capsule; Refill: 0  3. Left inguinal hernia Consider surgery referral if pain does not resolved after UTI is fully treated.        Lelon Huh, MD  [facility]

## 2018-01-22 LAB — URINE CULTURE: Organism ID, Bacteria: NO GROWTH

## 2018-01-23 ENCOUNTER — Telehealth: Payer: Self-pay | Admitting: Family Medicine

## 2018-01-23 NOTE — Telephone Encounter (Signed)
Per Dr. Maralyn Sago note he wanted to send him to surgery. If agreeable I can place order for him. Also can see if he has any general surgeons he prefers to see.

## 2018-01-23 NOTE — Telephone Encounter (Signed)
Pt stated he had OV with Dr. Caryn Section on 01/20/18 and has been taking nitrofurantoin (MACRODANTIN) 100 MG capsule. Pt stated that his symptoms haven't improved and is requesting a call. Pt was advised that Dr. Caryn Section was out of the office this afternoon. Please advise. Thanks TNP

## 2018-01-23 NOTE — Telephone Encounter (Signed)
Patient was advised of results. Expressed understanding. Patient states he is not any better. He states his discharge is now a pinkish color. Please advise? Patient is aware Dr. Caryn Section is not here this afternoon. Please advise?

## 2018-01-23 NOTE — Telephone Encounter (Signed)
-----   Message from Birdie Sons, MD sent at 01/23/2018  2:10 PM EST ----- Urine culture is negative. Would recommend he finish antibiotic that was prescribed. If not better when finished then he should be referred to surgery.

## 2018-01-24 ENCOUNTER — Telehealth: Payer: Self-pay | Admitting: Family Medicine

## 2018-01-24 DIAGNOSIS — R1031 Right lower quadrant pain: Secondary | ICD-10-CM

## 2018-01-24 DIAGNOSIS — R972 Elevated prostate specific antigen [PSA]: Secondary | ICD-10-CM

## 2018-01-24 DIAGNOSIS — R369 Urethral discharge, unspecified: Secondary | ICD-10-CM

## 2018-01-24 MED ORDER — CIPROFLOXACIN HCL 500 MG PO TABS: 500.0000 mg | ORAL_TABLET | Freq: Two times a day (BID) | ORAL | 0 refills | Status: AC

## 2018-01-24 NOTE — Telephone Encounter (Signed)
This may be related to his prostate. He needs to get back in with urologist. Up to him if he would like to go back to [facility] or referral to a local urologist.   In the meantime, change antibiotic to ciprofloxacin 500mg  twice a day for 14 days to cover for possible possible prostate infection.

## 2018-01-24 NOTE — Telephone Encounter (Signed)
Pt stated that he would like to move forward with referral to see a urologist. Pt stated that he would like to see someone with [facility]. Please advise. Thanks TNP

## 2018-01-24 NOTE — Telephone Encounter (Signed)
Urology referral was ordered. However, can you please associate diagnosis? I was not sure if it would be prostatitis vs urinary frequency or both? Please review. Thanks!

## 2018-01-24 NOTE — Telephone Encounter (Signed)
Advised patient as below. Medication was sent into the pharmacy.  

## 2018-02-24 ENCOUNTER — Ambulatory Visit: Payer: Medicare Other | Admitting: Urology

## 2018-06-23 DIAGNOSIS — Z87891 Personal history of nicotine dependence: Secondary | ICD-10-CM | POA: Diagnosis not present

## 2018-06-23 DIAGNOSIS — I4510 Unspecified right bundle-branch block: Secondary | ICD-10-CM | POA: Diagnosis not present

## 2018-06-23 DIAGNOSIS — R0789 Other chest pain: Secondary | ICD-10-CM | POA: Diagnosis not present

## 2018-06-23 DIAGNOSIS — R0602 Shortness of breath: Secondary | ICD-10-CM | POA: Diagnosis not present

## 2018-06-23 DIAGNOSIS — Z888 Allergy status to other drugs, medicaments and biological substances status: Secondary | ICD-10-CM | POA: Diagnosis not present

## 2018-06-23 DIAGNOSIS — R000 Tachycardia, unspecified: Secondary | ICD-10-CM | POA: Diagnosis not present

## 2018-06-23 DIAGNOSIS — R079 Chest pain, unspecified: Secondary | ICD-10-CM | POA: Diagnosis not present

## 2018-06-23 DIAGNOSIS — I10 Essential (primary) hypertension: Secondary | ICD-10-CM | POA: Diagnosis not present

## 2018-06-23 DIAGNOSIS — Z79899 Other long term (current) drug therapy: Secondary | ICD-10-CM | POA: Diagnosis not present

## 2018-06-23 DIAGNOSIS — F419 Anxiety disorder, unspecified: Secondary | ICD-10-CM | POA: Diagnosis not present

## 2018-07-08 DIAGNOSIS — L819 Disorder of pigmentation, unspecified: Secondary | ICD-10-CM | POA: Diagnosis not present

## 2018-07-08 DIAGNOSIS — D1801 Hemangioma of skin and subcutaneous tissue: Secondary | ICD-10-CM | POA: Diagnosis not present

## 2018-07-08 DIAGNOSIS — L218 Other seborrheic dermatitis: Secondary | ICD-10-CM | POA: Diagnosis not present

## 2018-07-08 DIAGNOSIS — L570 Actinic keratosis: Secondary | ICD-10-CM | POA: Diagnosis not present

## 2018-07-08 DIAGNOSIS — D485 Neoplasm of uncertain behavior of skin: Secondary | ICD-10-CM | POA: Diagnosis not present

## 2018-07-08 DIAGNOSIS — L821 Other seborrheic keratosis: Secondary | ICD-10-CM | POA: Diagnosis not present

## 2018-07-08 DIAGNOSIS — D229 Melanocytic nevi, unspecified: Secondary | ICD-10-CM | POA: Diagnosis not present

## 2018-07-08 DIAGNOSIS — L814 Other melanin hyperpigmentation: Secondary | ICD-10-CM | POA: Diagnosis not present

## 2018-08-07 DIAGNOSIS — D235 Other benign neoplasm of skin of trunk: Secondary | ICD-10-CM | POA: Diagnosis not present

## 2018-08-07 DIAGNOSIS — D485 Neoplasm of uncertain behavior of skin: Secondary | ICD-10-CM | POA: Diagnosis not present

## 2018-08-07 DIAGNOSIS — R238 Other skin changes: Secondary | ICD-10-CM | POA: Diagnosis not present

## 2018-08-21 DIAGNOSIS — L570 Actinic keratosis: Secondary | ICD-10-CM | POA: Diagnosis not present

## 2018-10-19 ENCOUNTER — Other Ambulatory Visit: Payer: Self-pay | Admitting: Family Medicine

## 2018-11-18 ENCOUNTER — Other Ambulatory Visit: Payer: Self-pay | Admitting: Family Medicine

## 2018-11-18 DIAGNOSIS — N400 Enlarged prostate without lower urinary tract symptoms: Secondary | ICD-10-CM

## 2018-11-18 DIAGNOSIS — N529 Male erectile dysfunction, unspecified: Secondary | ICD-10-CM

## 2018-11-24 DIAGNOSIS — D3610 Benign neoplasm of peripheral nerves and autonomic nervous system, unspecified: Secondary | ICD-10-CM | POA: Diagnosis not present

## 2018-11-24 DIAGNOSIS — L578 Other skin changes due to chronic exposure to nonionizing radiation: Secondary | ICD-10-CM | POA: Diagnosis not present

## 2018-11-24 DIAGNOSIS — L570 Actinic keratosis: Secondary | ICD-10-CM | POA: Diagnosis not present

## 2018-11-26 ENCOUNTER — Telehealth: Payer: Self-pay

## 2018-11-26 NOTE — Telephone Encounter (Signed)
Called pt to schedule his AWV for this year and pt declined. FYI to PCP! -MM

## 2018-12-25 DIAGNOSIS — L2084 Intrinsic (allergic) eczema: Secondary | ICD-10-CM | POA: Diagnosis not present

## 2018-12-25 DIAGNOSIS — L578 Other skin changes due to chronic exposure to nonionizing radiation: Secondary | ICD-10-CM | POA: Diagnosis not present

## 2019-04-17 ENCOUNTER — Other Ambulatory Visit: Payer: Self-pay | Admitting: Family Medicine

## 2019-04-17 MED ORDER — ALLOPURINOL 300 MG PO TABS: 300.0000 mg | ORAL_TABLET | Freq: Every day | ORAL | 4 refills | Status: DC

## 2019-04-17 NOTE — Telephone Encounter (Signed)
[facility] faxed refill request for the following medications:  allopurinol (ZYLOPRIM) 300 MG tablet  Please advise. Thanks TNP

## 2019-04-21 DIAGNOSIS — L819 Disorder of pigmentation, unspecified: Secondary | ICD-10-CM | POA: Diagnosis not present

## 2019-04-21 DIAGNOSIS — L570 Actinic keratosis: Secondary | ICD-10-CM | POA: Diagnosis not present

## 2019-05-20 ENCOUNTER — Telehealth: Payer: Self-pay | Admitting: Family Medicine

## 2019-05-20 NOTE — Chronic Care Management (AMB) (Signed)
°  Chronic Care Management   Outreach Note  05/20/2019 Name: Larry Crawford MRN: [id number] DOB: 11-14-1941  Referred by: Birdie Sons, MD Reason for referral : Chronic Care Management (Initial CCM outreach )   An unsuccessful telephone outreach was attempted today. The patient was referred to the case management team by for assistance with chronic care management and care coordination.   Follow Up Plan: The care management team will reach out to the patient again over the next 7 days.   Meta  ??[email or web address]   ??[phone]

## 2019-05-21 NOTE — Chronic Care Management (AMB) (Signed)
Chronic Care Management   Note  05/21/2019 Name: ROBLEY MATASSA MRN: [id number] DOB: 04/04/1941  Cristopher Estimable Bricco is a 78 y.o. year old male who is a primary care patient of Caryn Section, Kirstie Peri, MD. I reached out to [name] by phone today in response to a referral sent by Mr. Cristopher Estimable [place]'s health plan.    Mr. Burgert was given information about Chronic Care Management services today including:  1. CCM service includes personalized support from designated clinical staff supervised by his physician, including individualized plan of care and coordination with other care providers 2. 24/7 contact phone numbers for assistance for urgent and routine care needs. 3. Service will only be billed when office clinical staff spend 20 minutes or more in a month to coordinate care. 4. Only one practitioner may furnish and bill the service in a calendar month. 5. The patient may stop CCM services at any time (effective at the end of the month) by phone call to the office staff. 6. The patient will be responsible for cost sharing (co-pay) of up to 20% of the service fee (after annual deductible is met).  Patient agreed to services and verbal consent obtained.   Follow up plan: Telephone appointment with CCM team member scheduled for: 06/18/2019  Clarinda  ??[email or web address]   ??[phone]

## 2019-06-18 ENCOUNTER — Ambulatory Visit: Payer: Medicare Other

## 2019-06-18 ENCOUNTER — Other Ambulatory Visit: Payer: Self-pay

## 2019-06-18 NOTE — Chronic Care Management (AMB) (Signed)
  Chronic Care Management   Initial Visit Note  06/18/2019 Name: Larry Crawford MRN: [id number] DOB: [birth year]  Subjective: "I have not seen Dr. Caryn Crawford in over a year"  Objective:  Lab Results  Component Value Date   HGBA1C 5.9 (H) 01/15/2018   HGBA1C 5.7 (H) 01/09/2017   Lab Results  Component Value Date   LDLCALC 155 (H) 01/15/2018   CREATININE 1.20 10/04/2015   Lab Results  Component Value Date   CHOL 233 (H) 01/15/2018   HDL 36 (L) 01/15/2018   LDLCALC 155 (H) 01/15/2018   TRIG 210 (H) 01/15/2018   CHOLHDL 6.5 (H) 01/15/2018   BP Readings from Last 3 Encounters:  01/20/18 132/70  01/03/18 (!) 151/77  12/30/17 137/67     Assessment:   Review of patient status, including review of consultants reports, relevant laboratory and other test results, and collaboration with appropriate care team members and the patient's provider was performed as part of comprehensive patient evaluation and provision of chronic care management services.    Advanced Directives 06/18/2019  Does Patient Have a Medical Advance Directive? Yes  Type of Paramedic of [place];Living will  Does patient want to make changes to medical advance directive? No - Patient declined  Copy of [place] in Chart? No - copy requested  Would patient like information on creating a medical advance directive? -     Goals Addressed            This Visit's Progress   . I havent seen Dr. Caryn Crawford in over a year (pt-stated)       Mr. Larry Crawford admittedly states he has not had a follow up with Dr. Caryn Crawford in over a year. Last appointment note 01/20/2018. CCM Services cannot be initiated until patient follows up with PCP. Appointment scheduled for 08/25/2019.   Current Barriers:  Marland Kitchen Knowledge Deficits related to importance of yearly physicals and follow ups with primary providers . Fear of COVID-19  Nurse Case Manager Clinical Goal(s):  Marland Kitchen Over the next 7 days, patient will  demonstrate improved health management independence as evidenced by calling to schedule yearly physical . Over the next 90 days, patient will complete yearly physical and AWV  Interventions:  . Reviewed chart for last PCP visit note . Advised patient to call PCP office ASAP and schedule his AWV and yearly physical with Dr. Caryn Crawford (scheduled for 08/25/2019 at 9:00) . Reviewed medications with patient and discussed compliance . Provided emotional support and reassurance regarding Covid 19 and safety of MD office in midst of pandemic . Discussed plans with patient for ongoing care management follow up once he has been seen by PCP and provided patient with direct contact information for care management team  Patient Self Care Activities:  . Self administers medications as prescribed . Attends all scheduled provider appointments  Initial goal documentation         Follow up plan:  Patient will be schedule for CCM Call back to discuss enrollment after appointment with PCP. Appointment scheduled for 08/25/2019 at 1:00 after his 9:00 am appointment with Dr. Azucena Freed E. Rollene Rotunda, RN, BSN Nurse Care Coordinator [facility] Care Management [phone]

## 2019-08-05 ENCOUNTER — Ambulatory Visit (INDEPENDENT_AMBULATORY_CARE_PROVIDER_SITE_OTHER): Payer: Medicare Other

## 2019-08-05 ENCOUNTER — Other Ambulatory Visit: Payer: Self-pay

## 2019-08-05 DIAGNOSIS — Z23 Encounter for immunization: Secondary | ICD-10-CM

## 2019-08-25 ENCOUNTER — Encounter: Payer: Self-pay | Admitting: Family Medicine

## 2019-08-25 ENCOUNTER — Other Ambulatory Visit: Payer: Self-pay

## 2019-08-25 ENCOUNTER — Telehealth: Payer: Self-pay

## 2019-08-25 ENCOUNTER — Ambulatory Visit (INDEPENDENT_AMBULATORY_CARE_PROVIDER_SITE_OTHER): Payer: Medicare Other | Admitting: Family Medicine

## 2019-08-25 VITALS — BP 118/60 | HR 66 | Temp 97.9°F | Resp 16 | Wt 184.0 lb

## 2019-08-25 DIAGNOSIS — M1711 Unilateral primary osteoarthritis, right knee: Secondary | ICD-10-CM

## 2019-08-25 DIAGNOSIS — Z125 Encounter for screening for malignant neoplasm of prostate: Secondary | ICD-10-CM | POA: Diagnosis not present

## 2019-08-25 DIAGNOSIS — Z0000 Encounter for general adult medical examination without abnormal findings: Secondary | ICD-10-CM

## 2019-08-25 DIAGNOSIS — E782 Mixed hyperlipidemia: Secondary | ICD-10-CM | POA: Diagnosis not present

## 2019-08-25 MED ORDER — HYDROCODONE-ACETAMINOPHEN 5-325 MG PO TABS: 1.0000 | ORAL_TABLET | ORAL | 0 refills | Status: DC | PRN

## 2019-08-25 NOTE — Progress Notes (Signed)
Patient: Larry Crawford, Male    DOB: Mar 14, 1941, 78 y.o.   MRN: [id number] Visit Date: 08/25/2019  Today's Provider: Lelon Huh, MD   Chief Complaint  Patient presents with  . Medicare Wellness   Subjective:     Annual wellness visit Larry Crawford is a 78 y.o. male. He feels fairly well. He reports exercising daily. He reports he is sleeping fairly well.  -----------------------------------------------------------   Lipid/Cholesterol, Follow-up:   Last seen for this1 years ago.  Management changes since that visit include none. . Last Lipid Panel:    Component Value Date/Time   CHOL 233 (H) 01/15/2018 0805   TRIG 210 (H) 01/15/2018 0805   HDL 36 (L) 01/15/2018 0805   CHOLHDL 6.5 (H) 01/15/2018 0805   LDLCALC 155 (H) 01/15/2018 0805    Risk factors for vascular disease include hypercholesterolemia  He reports good compliance with treatment. He is not having side effects.  Current symptoms include none and have been stable. Weight trend: fluctuating a bit Prior visit with dietician: no Current diet: well balanced Current exercise: walking  Wt Readings from Last 3 Encounters:  08/25/19 184 lb (83.5 kg)  01/20/18 203 lb (92.1 kg)  01/10/18 198 lb (89.8 kg)    -------------------------------------------------------------------  Hyperglycemia, Follow-up:   Lab Results  Component Value Date   HGBA1C 5.9 (H) 01/15/2018   HGBA1C 5.7 (H) 01/09/2017   GLUCOSE 117 (H) 12/13/2016   GLUCOSE 101 (H) 10/04/2015    Last seen for for this 1 years ago.  Management since then includes no changes. Current symptoms include none and have been stable.  Weight trend: fluctuating a bit Prior visit with dietician: no Current diet: well balanced Current exercise: walking  Pertinent Labs:    Component Value Date/Time   CHOL 233 (H) 01/15/2018 0805   TRIG 210 (H) 01/15/2018 0805   CHOLHDL 6.5 (H) 01/15/2018 0805   CREATININE 1.20 10/04/2015 1014    Wt  Readings from Last 3 Encounters:  08/25/19 184 lb (83.5 kg)  01/20/18 203 lb (92.1 kg)  01/10/18 198 lb (89.8 kg)    Follow up for BPH:  The patient was last seen for this 1 years ago. Changes made at last visit include none.  He reports fair compliance with treatment. He feels that condition is Unchanged. He is not having side effects.   ------------------------------------------------------------------------------------  Review of Systems  Constitutional: Negative for appetite change, chills, fatigue and fever.  HENT: Positive for tinnitus. Negative for congestion, ear pain, hearing loss, nosebleeds and trouble swallowing.   Eyes: Negative for pain and visual disturbance.  Respiratory: Negative for cough, chest tightness and shortness of breath.   Cardiovascular: Negative for chest pain, palpitations and leg swelling.  Gastrointestinal: Negative for abdominal pain, blood in stool, constipation, diarrhea, nausea and vomiting.  Endocrine: Negative for polydipsia, polyphagia and polyuria.  Genitourinary: Negative for dysuria and flank pain.  Musculoskeletal: Negative for arthralgias, back pain, joint swelling, myalgias and neck stiffness.  Skin: Negative for color change, rash and wound.  Neurological: Negative for dizziness, tremors, seizures, speech difficulty, weakness, light-headedness and headaches.  Psychiatric/Behavioral: Negative for behavioral problems, confusion, decreased concentration, dysphoric mood and sleep disturbance. The patient is not nervous/anxious.   All other systems reviewed and are negative.   Social History   Socioeconomic History  . Marital status: Married    Spouse name: Not on file  . Number of children: 4  . Years of education: Not on file  .  Highest education level: 12th grade  Occupational History  . Occupation: retired  Scientific laboratory technician  . Financial resource strain: Not hard at all  . Food insecurity    Worry: Never true    Inability: Never  true  . Transportation needs    Medical: No    Non-medical: No  Tobacco Use  . Smoking status: Former Smoker    Packs/day: 0.75    Years: 5.00    Pack years: 3.75    Types: Cigarettes    Quit date: 11/19/1958    Years since quitting: 60.8  . Smokeless tobacco: Never Used  Substance and Sexual Activity  . Alcohol use: Yes    Alcohol/week: 0.0 standard drinks    Comment: 1 wine drink a month  . Drug use: No  . Sexual activity: Not on file  Lifestyle  . Physical activity    Days per week: Not on file    Minutes per session: Not on file  . Stress: Not at all  Relationships  . Social Herbalist on phone: Not on file    Gets together: Not on file    Attends religious service: Not on file    Active member of club or organization: Not on file    Attends meetings of clubs or organizations: Not on file    Relationship status: Not on file  . Intimate partner violence    Fear of current or ex partner: Not on file    Emotionally abused: Not on file    Physically abused: Not on file    Forced sexual activity: Not on file  Other Topics Concern  . Not on file  Social History Narrative   Pt went to [employer] school through the [employer].     Past Medical History:  Diagnosis Date  . BPH (benign prostatic hypertrophy)   . Colon polyp   . Elevated red blood cell count   . Gout   . History of chicken pox   . History of measles   . History of mumps   . IBS (irritable bowel syndrome)      Patient Active Problem List   Diagnosis Date Noted  . Hyperglycemia 12/14/2016  . Arthritis 07/16/2016  .   09/28/2015  . Alopecia 09/26/2015  . Arthralgia of right hand 09/26/2015  . BPH (benign prostatic hyperplasia) 09/26/2015  . Bursitis of left shoulder 09/26/2015  . Moderate episode of recurrent major depressive disorder ([place]) 09/26/2015  . Tinnitus 09/26/2015  . Vitamin D deficiency 09/26/2015  . 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 Elevated prostate  specific antigen (PSA) 08/25/2012  . Myalgia and myositis 05/08/2010  . History of colon polyps 02/21/2010  . Breast lump 06/16/2009  . 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 IBS (irritable bowel syndrome) 03/29/2009  . Malaise and fatigue 03/29/2009  . Gout 03/15/2009  . Mixed hyperlipidemia 03/15/2009    Past Surgical History:  Procedure Laterality Date  . aortic ultrasound  2013   normal. Scre[facility] ultrasound [facility] per pt report  . 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 BUNIONECTOMY     left  . CHOLECYSTECTOMY  1997  . COLONOSCOPY    . INGUINAL HERNIA REPAIR  [phone]   2 on right/1 on left  . LIPOMA EXCISION  2013  . NOSE SURGERY     divided septum  . POLYPECTOMY    . [place]  . SHOULDER ARTHROSCOPY  1984 and 1987  left X2 for spurs  . TONSILLECTOMY AND ADENOIDECTOMY  2002  . UMBILICAL HERNIA REPAIR      His family history includes Cancer in his brother; Diabetes in his brother; Hypothyroidism in his mother; Obesity in his brother.   Current Outpatient Medications:  .  Acetylcysteine 600 MG CAPS, Take 1 capsule by mouth daily., Disp: , Rfl:  .  allopurinol (ZYLOPRIM) 300 MG tablet, Take 1 tablet (300 mg total) by mouth daily., Disp: 90 tablet, Rfl: 4 .  arginine 500 MG tablet, Take 1 tablet by mouth daily. , Disp: , Rfl:  .  Cholecalciferol (VITAMIN D3) 5000 units CAPS, Take 1 capsule by mouth daily., Disp: , Rfl:  .  fexofenadine (ALLEGRA) 180 MG tablet, Take 180 mg by mouth daily., Disp: , Rfl:  .  GINSENG EXTRACT PO, Take by mouth., Disp: , Rfl:  .  HYDROcodone-acetaminophen (NORCO) 5-325 MG tablet, Take 1 tablet by mouth every 4 (four) hours as needed for moderate pain., Disp: 6 tablet, Rfl: 0 .  INOSITOL PO, Take 600 mg by mouth daily.  , Disp: , Rfl:  .  Kelp 225 MCG TABS, Take 1 tablet by mouth 2 (two) times a week. Takes 2-3 times a week , Disp: , Rfl:  .  Lutein 20 MG CAPS, Take 1 capsule by mouth daily.  , Disp: , Rfl:  .  Multiple Vitamins-Minerals  (MULTIVITAMIN WITH MINERALS) tablet, Take 1 tablet by mouth daily.  , Disp: , Rfl:  .  niacin 500 MG tablet, Take 500 mg by mouth at bedtime. , Disp: , Rfl:  .  Probiotic CAPS, Take 1 capsule by mouth daily., Disp: , Rfl:  .  tadalafil (CIALIS) 5 MG tablet, TAKE 1 TABLET DAILY, Disp: 90 tablet, Rfl: 4 .  vitamin B-12 (CYANOCOBALAMIN) 250 MCG tablet, Take 1 tablet by mouth daily., Disp: , Rfl:  .  Whey Protein POWD, Take 4 g by mouth once a week. , Disp: , Rfl:   Patient Care Team: Birdie Sons, MD as PCP - General (Family Medicine) Lorelee Cover., MD as Consulting Physician (Ophthalmology) Benedetto Goad, RN as Registered Nurse    Objective:    Vitals: BP 118/60 (BP Location: Left Arm, Patient Position: Sitting, Cuff Size: Large)   Pulse 66   Temp 97.9 F (36.6 C) (Temporal)   Resp 16   Wt 184 lb (83.5 kg)   BMI 27.98 kg/m   Physical Exam  Activities of Daily Living In your present state of health, do you have any difficulty performing the following activities: 08/25/2019  Hearing? Y  Vision? N  Difficulty concentrating or making decisions? N  Walking or climbing stairs? N  Dressing or bathing? N  Doing errands, shopping? N  Some recent data might be hidden    Fall Risk Assessment Fall Risk  08/25/2019 11/26/2017 11/20/2016 10/04/2015  Falls in the past year? 0 No No No  Number falls in past yr: 0 - - -  Injury with Fall? 0 - - -  Follow up Falls evaluation completed - - -     Depression Screen PHQ 2/9 Scores 08/25/2019 11/26/2017 11/20/2016 10/04/2015  PHQ - 2 Score 0 0 0 4  PHQ- 9 Score 3 - - 9    6CIT Screen 11/20/2016  What Year? 0 points  What month? 0 points  What time? 0 points  Count back from 20 0 points  Months in reverse 0 points  Repeat phrase 2 points  Total Score 2  Cognitive Testing - 6-CIT  Correct? Score   What year is it? yes 0 0 or 4  What month is it? yes 0 0 or 3  Memorize:    Pia Mau,  41,  [place],      What time  is it? (within 1 hour) yes 0 0 or 3  Count backwards from 20 yes 0 0, 2, or 4  Name the months of the year no 4 0, 2, or 4  Repeat name & address above no 3 0, 2, 4, 6, 8, or 10       TOTAL SCORE  7/28   Interpretation:  Abnormal- .  Normal (0-7) Abnormal (8-28)   Assessment & Plan:     Annual Wellness Visit  Reviewed patient's Family Medical History Reviewed and updated list of patient's medical providers Assessment of cognitive impairment was done Assessed patient's functional ability Established a written schedule for health screening [place] Completed and Reviewed  Exercise Activities and Dietary recommendations Goals    . DIET - REDUCE CALORIE INTAKE     Pt to start dieting and go on the HCG diet and lower calorie intake in daily diet.     . Exercise     Starting in March 2018, I will start exercising/walking 3 times a week for 30 minutes.     . I havent seen Dr. Caryn Section in over a year (pt-stated)     Current Barriers:  Marland Kitchen Knowledge Deficits related to importance of yearly physicals and follow ups with primary providers . Fear of COVID-19  Nurse Case Manager Clinical Goal(s):  Marland Kitchen Over the next 7 days, patient will demonstrate improved health management independence as evidenced by calling to schedule yearly physical . Over the next 90 days, patient will complete yearly physical and AWV  Interventions:  . Reviewed chart for last PCP visit note . Advised patient to call PCP office ASAP and schedule his AWV and yearly physical with Dr. Caryn Section (scheduled for 08/25/2019 at 9:00) . Reviewed medications with patient and discussed compliance . Provided emotional support and reassurance regarding Covid 19 and safety of MD office in midst of pandemic . Discussed plans with patient for ongoing care management follow up once he has been seen by PCP and provided patient with direct contact information for care management team  Patient Self Care Activities:  .  Self administers medications as prescribed . Attends all scheduled provider appointments  Initial goal documentation        Immunization History  Administered Date(s) Administered  . Fluad Quad(high Dose 65+) 08/05/2019  . Influenza, High Dose Seasonal PF 09/28/2015, 07/27/2017, 08/14/2018  . Pneumococcal Conjugate-13 05/11/2014  . Pneumococcal Polysaccharide-23 07/29/2005  . Rabies, IM 12/27/2017, 12/30/2017, 01/03/2018, 01/10/2018  . Tdap 08/25/2014, 12/27/2017  . Zoster 02/05/2013    Health Maintenance  Topic Date Due  . COLONOSCOPY  06/04/2016  . TETANUS/TDAP  12/28/2027  . INFLUENZA VACCINE  Completed  . PNA vac Low Risk Adult  Completed     Discussed health benefits of physical activity, and encouraged him to engage in regular exercise appropriate for his age and condition.    ------------------------------------------------------------------------------------------------------------  1. Prostate cancer screening  - PSA  2. Mixed hyperlipidemia Statin intolerant but tolerating OTC Niacin - Comprehensive metabolic panel - Lipid panel  3. Primary osteoarthritis of right knee refill - HYDROcodone-acetaminophen (NORCO) 5-325 MG tablet; Take 1 tablet by mouth every 4 (four) hours as needed for moderate pain.  Dispense: 20 tablet; Refill: 0  4. Medicare annual wellness visit, subsequent      Lelon Huh, MD  [facility]

## 2019-08-25 NOTE — Patient Instructions (Addendum)
.   Please review the attached list of medications and notify my office if there are any errors.   . Please bring all of your medications to every appointment so we can make sure that our medication list is the same as yours.  Please go to the lab draw station in [address] on the second floor of [facility] . Normal hours are 8:00am to 12:30pm and 1:30pm to 4:00pm Monday through Friday

## 2019-08-26 ENCOUNTER — Telehealth: Payer: Self-pay

## 2019-08-26 LAB — LIPID PANEL
Chol/HDL Ratio: 4.8 ratio (ref 0.0–5.0)
Cholesterol, Total: 189 mg/dL (ref 100–199)
HDL: 39 mg/dL — ABNORMAL LOW (ref >39)
LDL Chol Calc (NIH): 131 mg/dL — ABNORMAL HIGH (ref 0–99)
Triglycerides: 105 mg/dL (ref 0–149)
VLDL Cholesterol Cal: 19 mg/dL (ref 5–40)

## 2019-08-26 LAB — COMPREHENSIVE METABOLIC PANEL
ALT: 14 IU/L (ref 0–44)
AST: 20 IU/L (ref 0–40)
Albumin/Globulin Ratio: 2.4 — ABNORMAL HIGH (ref 1.2–2.2)
Albumin: 4.8 g/dL — ABNORMAL HIGH (ref 3.7–4.7)
Alkaline Phosphatase: 70 IU/L (ref 39–117)
BUN/Creatinine Ratio: 10 (ref 10–24)
BUN: 13 mg/dL (ref 8–27)
Bilirubin Total: 0.6 mg/dL (ref 0.0–1.2)
CO2: 21 mmol/L (ref 20–29)
Calcium: 10.2 mg/dL (ref 8.6–10.2)
Chloride: 91 mmol/L — ABNORMAL LOW (ref 96–106)
Creatinine, Ser: 1.29 mg/dL — ABNORMAL HIGH (ref 0.76–1.27)
GFR calc Af Amer: 61 mL/min/{1.73_m2} (ref >59)
GFR calc non Af Amer: 52 mL/min/{1.73_m2} — ABNORMAL LOW (ref >59)
Globulin, Total: 2 g/dL (ref 1.5–4.5)
Glucose: 95 mg/dL (ref 65–99)
Potassium: 4.7 mmol/L (ref 3.5–5.2)
Sodium: 130 mmol/L — ABNORMAL LOW (ref 134–144)
Total Protein: 6.8 g/dL (ref 6.0–8.5)

## 2019-08-26 LAB — PSA: Prostate Specific Ag, Serum: 16.6 ng/mL — ABNORMAL HIGH (ref 0.0–4.0)

## 2019-08-26 NOTE — Telephone Encounter (Signed)
LMTCB 08/26/2019  Thanks,   -Mickel Baas

## 2019-08-26 NOTE — Telephone Encounter (Signed)
Pt advised.   Thanks,   -[place]  

## 2019-08-26 NOTE — Telephone Encounter (Signed)
-----   Message from Birdie Sons, MD sent at 08/26/2019  7:47 AM EDT ----- Kidney functions have declined a bit. Need to drink more water. PSA is high at 16, but stable over the past few years.  Cholesterol is down from 233 last year to 189 this year.  Check labs yearly.

## 2019-09-01 ENCOUNTER — Telehealth: Payer: Self-pay

## 2019-09-08 ENCOUNTER — Telehealth: Payer: Self-pay

## 2019-09-08 ENCOUNTER — Ambulatory Visit: Payer: Self-pay

## 2019-09-09 NOTE — Chronic Care Management (AMB) (Signed)
  Chronic Care Management   Outreach Note   Name: Larry Crawford MRN: [id number] DOB: 1941/05/15  Referred by: Birdie Sons, MD Reason for referral : Care Coordination   An unsuccessful telephone outreach was attempted today. The patient was referred to the case management team for assistance with care management and care coordination. A HIPAA compliant voice message was left requesting a return call.  Follow Up Plan: The care management team will attempt outreach within the next two weeks.   [facility] Care Management [phone]

## 2019-09-15 ENCOUNTER — Ambulatory Visit: Payer: Self-pay

## 2019-09-15 ENCOUNTER — Telehealth: Payer: Self-pay

## 2019-09-15 NOTE — Chronic Care Management (AMB) (Signed)
  Chronic Care Management   Note  09/15/2019 Name: ALEXXANDER BRINDLE MRN: [id number] DOB: Apr 29, 1941    Attempted outreach with Mr. Alpers. He requested to be contacted within the next few weeks.    Follow up plan: -Will follow-up as requested and attempt outreach in two weeks.    [facility] Latino [facility] Care Management [phone]

## 2019-10-01 ENCOUNTER — Ambulatory Visit: Payer: Self-pay

## 2019-10-01 ENCOUNTER — Telehealth: Payer: Self-pay

## 2019-10-21 DIAGNOSIS — L989 Disorder of the skin and subcutaneous tissue, unspecified: Secondary | ICD-10-CM | POA: Diagnosis not present

## 2019-10-21 DIAGNOSIS — L821 Other seborrheic keratosis: Secondary | ICD-10-CM | POA: Diagnosis not present

## 2019-10-21 DIAGNOSIS — D485 Neoplasm of uncertain behavior of skin: Secondary | ICD-10-CM | POA: Diagnosis not present

## 2019-10-21 DIAGNOSIS — L570 Actinic keratosis: Secondary | ICD-10-CM | POA: Diagnosis not present

## 2019-10-21 DIAGNOSIS — D1801 Hemangioma of skin and subcutaneous tissue: Secondary | ICD-10-CM | POA: Diagnosis not present

## 2019-10-21 DIAGNOSIS — D225 Melanocytic nevi of trunk: Secondary | ICD-10-CM | POA: Diagnosis not present

## 2019-10-21 DIAGNOSIS — L218 Other seborrheic dermatitis: Secondary | ICD-10-CM | POA: Diagnosis not present

## 2019-11-20 DIAGNOSIS — C439 Malignant melanoma of skin, unspecified: Secondary | ICD-10-CM

## 2019-11-20 HISTORY — DX: Malignant melanoma of skin, unspecified: C43.9

## 2019-12-11 DIAGNOSIS — D0362 Melanoma in situ of left upper limb, including shoulder: Secondary | ICD-10-CM | POA: Diagnosis not present

## 2019-12-15 DIAGNOSIS — L570 Actinic keratosis: Secondary | ICD-10-CM | POA: Diagnosis not present

## 2020-01-14 ENCOUNTER — Telehealth: Payer: Self-pay

## 2020-01-14 ENCOUNTER — Ambulatory Visit: Payer: Self-pay

## 2020-01-14 NOTE — Chronic Care Management (AMB) (Signed)
  Chronic Care Management   Outreach Note    Name: Larry Crawford MRN: [id number] DOB: Dec 22, 1940  Primary Care Provider: Birdie Sons, MD Reason for referral : Chronic Care Management   An unsuccessful telephone outreach was attempted today. [name] was referred to the case management team for assistance with care management and care coordination.   A HIPAA compliant voice message was left today requesting a return call.   Follow Up Plan: -Pending return call. -The care management team will plan to continue routine outreach.   [facility]ris Latino [facility] Care Management [phone]

## 2020-01-14 NOTE — Chronic Care Management (AMB) (Signed)
  Chronic Care Management   Outreach Note  01/14/2020 Name: Larry Crawford MRN: [id number] DOB: 11/15/1941    Primary Care Provider: Birdie Sons, MD Reason for referral : Chronic Care Management   An unsuccessful telephone outreach was attempted today. [name]  was previously referred to the case management team for assistance with care management and care coordination.   A HIPAA compliant voice message was left today requesting a return call.   Follow Up Plan: The care management team will reach out to [name] again next week.   [facility]ris Latino [facility] Care Management [phone]

## 2020-01-21 ENCOUNTER — Ambulatory Visit: Payer: Self-pay

## 2020-01-21 ENCOUNTER — Telehealth: Payer: Self-pay

## 2020-01-21 NOTE — Chronic Care Management (AMB) (Signed)
  Chronic Care Management   Outreach Note  01/21/2020 Name: Larry Crawford MRN: [id number] DOB: 28-Apr-1941  Primary Care Provider: Birdie Sons, MD Reason for referral : Chronic Care Management   An unsuccessful telephone outreach was attempted today. [name] was previously engaged with the case management team. A routine outreach was attempted today.  A HIPAA compliant voice message was left requesting a return call.   Follow Up Plan The care management team will reach out to Larry Crawford again within the next two weeks.   [facility] Latino [facility] Care Management [phone]

## 2020-01-29 ENCOUNTER — Ambulatory Visit (INDEPENDENT_AMBULATORY_CARE_PROVIDER_SITE_OTHER): Payer: Medicare Other | Admitting: Physician Assistant

## 2020-01-29 DIAGNOSIS — N419 Inflammatory disease of prostate, unspecified: Secondary | ICD-10-CM

## 2020-01-29 DIAGNOSIS — R300 Dysuria: Secondary | ICD-10-CM | POA: Diagnosis not present

## 2020-01-29 DIAGNOSIS — Z87438 Personal history of other diseases of male genital organs: Secondary | ICD-10-CM | POA: Diagnosis not present

## 2020-01-29 LAB — POCT URINALYSIS DIPSTICK
Bilirubin, UA: NEGATIVE
Blood, UA: NEGATIVE
Glucose, UA: NEGATIVE
Ketones, UA: NEGATIVE
Nitrite, UA: NEGATIVE
Protein, UA: NEGATIVE
Spec Grav, UA: 1.01 (ref 1.010–1.025)
Urobilinogen, UA: 0.2 E.U./dL
pH, UA: 7 (ref 5.0–8.0)

## 2020-01-29 MED ORDER — CIPROFLOXACIN HCL 500 MG PO TABS: 500.0000 mg | ORAL_TABLET | Freq: Two times a day (BID) | ORAL | 0 refills | Status: AC

## 2020-01-29 NOTE — Progress Notes (Signed)
Patient: Larry Crawford Male    DOB: [birth year]   79 y.o.   MRN: [id number] Visit Date: 01/29/2020  Today's Provider: Trinna Post, PA-C   Chief Complaint  Patient presents with  . Urinary Frequency   Subjective:    Virtual Visit via Telephone Note  I connected with Larry Crawford on 01/29/20 at 10:40 AM EST by telephone and verified that I am speaking with the correct person using two identifiers.  Location: Patient: Home Provider: Office    I discussed the limitations, risks, security and privacy concerns of performing an evaluation and management service by telephone and the availability of in person appointments. I also discussed with the patient that there may be a patient responsible charge related to this service. The patient expressed understanding and agreed to proceed.   Urinary Frequency  This is a new problem. The current episode started yesterday. The problem occurs intermittently. The quality of the pain is described as burning. The pain is at a severity of 4/10. There has been no fever. Associated symptoms include chills, flank pain, frequency, hematuria, hesitancy and urgency. Pertinent negatives include no discharge, nausea or vomiting. He has tried nothing for the symptoms.   Patient reports a history of prostatitis and elevated PSA which he reports a recurrence of similar symptoms. He reports he was having chills and urinary frequency yesterday without much urination when he attempted to urinate. He also reports painful urination. He reports last episode of prostatitis was ten years ago. He has some back pain and rectal pain which is not normal for him. He denies vomiting. Has not measured temperature. He was previously followed by [facility] for elevated PSA historically but hasn't seen them in many years.    Allergies  Allergen Reactions  . Tetracyclines & Related Shortness Of Breath and Rash  . Colestipol Hcl     Other reaction(s): Diarrhea Fatigue  .  Corn-Containing Products     Diarrhea and "makes my insides hurt"   . Pravastatin Sodium     Other reaction(s): Muscle Pain  . Shellfish Allergy   . Sulfa Antibiotics Hives  . Zetia [Ezetimibe]     Didn't feel good when he was taking it.      Current Outpatient Medications:  .  Acetylcysteine 600 MG CAPS, Take 1 capsule by mouth daily., Disp: , Rfl:  .  allopurinol (ZYLOPRIM) 300 MG tablet, Take 1 tablet (300 mg total) by mouth daily., Disp: 90 tablet, Rfl: 4 .  arginine 500 MG tablet, Take 1 tablet by mouth daily. , Disp: , Rfl:  .  Cholecalciferol (VITAMIN D3) 5000 units CAPS, Take 1 capsule by mouth daily., Disp: , Rfl:  .  fexofenadine (ALLEGRA) 180 MG tablet, Take 180 mg by mouth daily., Disp: , Rfl:  .  GINSENG EXTRACT PO, Take by mouth., Disp: , Rfl:  .  HYDROcodone-acetaminophen (NORCO) 5-325 MG tablet, Take 1 tablet by mouth every 4 (four) hours as needed for moderate pain., Disp: 20 tablet, Rfl: 0 .  INOSITOL PO, Take 600 mg by mouth daily.  , Disp: , Rfl:  .  Kelp 225 MCG TABS, Take 1 tablet by mouth 2 (two) times a week. Takes 2-3 times a week , Disp: , Rfl:  .  Lutein 20 MG CAPS, Take 1 capsule by mouth daily.  , Disp: , Rfl:  .  Multiple Vitamins-Minerals (MULTIVITAMIN WITH MINERALS) tablet, Take 1 tablet by mouth daily.  , Disp: , Rfl:  .  niacin 500 MG tablet, Take 500 mg by mouth at bedtime. , Disp: , Rfl:  .  Probiotic CAPS, Take 1 capsule by mouth daily., Disp: , Rfl:  .  tadalafil (CIALIS) 5 MG tablet, TAKE 1 TABLET DAILY, Disp: 90 tablet, Rfl: 4 .  vitamin B-12 (CYANOCOBALAMIN) 250 MCG tablet, Take 1 tablet by mouth daily., Disp: , Rfl:  .  Whey Protein POWD, Take 4 g by mouth once a week. , Disp: , Rfl:   Review of Systems  Constitutional: Positive for chills.  Gastrointestinal: Negative for nausea and vomiting.  Genitourinary: Positive for flank pain, frequency, hematuria, hesitancy and urgency.    Social History   Tobacco Use  . Smoking status: Former  Smoker    Packs/day: 0.75    Years: 5.00    Pack years: 3.75    Types: Cigarettes    Quit date: 11/19/1958    Years since quitting: 61.2  . Smokeless tobacco: Never Used  Substance Use Topics  . Alcohol use: Yes    Alcohol/week: 0.0 standard drinks    Comment: 1 wine drink a month      Objective:   There were no vitals taken for this visit. There were no vitals filed for this visit.There is no height or weight on file to calculate BMI.   Physical Exam   No results found for any visits on 01/29/20.     Assessment & Plan    1. Prostatitis, unspecified prostatitis type  We will treat for prostatitis as below. He is dropping off a urine sample. We will refer to urology for ongoing management of this and elevated PSA.   - Ambulatory referral to Urology  2. History of prostatitis  - POCT Urinalysis Dipstick - CULTURE, URINE COMPREHENSIVE - ciprofloxacin (CIPRO) 500 MG tablet; Take 1 tablet (500 mg total) by mouth 2 (two) times daily for 7 days.  Dispense: 14 tablet; Refill: 0 - Ambulatory referral to Urology  3. Dysuria  - POCT Urinalysis Dipstick - CULTURE, URINE COMPREHENSIVE - ciprofloxacin (CIPRO) 500 MG tablet; Take 1 tablet (500 mg total) by mouth 2 (two) times daily for 7 days.  Dispense: 14 tablet; Refill: 0 - Ambulatory referral to Urology  I discussed the assessment and treatment plan with the patient. The patient was provided an opportunity to ask questions and all were answered. The patient agreed with the plan and demonstrated an understanding of the instructions.   The patient was advised to call back or seek an in-person evaluation if the symptoms worsen or if the condition fails to improve as anticipated.  The entirety of the information documented in the History of Present Illness, Review of Systems and Physical Exam were personally obtained by me. Portions of this information were initially documented by Elonda Husky, CMA and reviewed by me for  thoroughness and accuracy.   F/u PRN    Trinna Post, PA-C  [facility]

## 2020-02-02 ENCOUNTER — Ambulatory Visit: Payer: Medicare Other

## 2020-02-02 ENCOUNTER — Telehealth: Payer: Self-pay

## 2020-02-02 LAB — CULTURE, URINE COMPREHENSIVE

## 2020-02-02 NOTE — Chronic Care Management (AMB) (Signed)
  Chronic Care Management   Note  02/02/2020 Name: Larry Crawford MRN: [id number] DOB: 11/26/1940    Brief outreach with Mr. Guzowski regarding the Chronic Case Management (CCM) program. Per chart review, the CCM team attempted to engage him in July 2020. He was unable to enroll at that time due to not being examined by his primary care provider (PCP) in over a year. He was evaluated by his PCP on 08/25/19. The CCM team made several attempts to contact him since that time.  Today he reports doing well. He did not feel that additional assistance was needed and declined enrollment in the Chronic Case Management program. He was encouraged to contact the [facility] and/or his PCP if care management services are needed.   PLAN -Will close CCM enrollment and update Dr. Caryn Section.   [facility] Care Management [phone]

## 2020-02-02 NOTE — Telephone Encounter (Signed)
Pt advised.  He reports he is feeling better.  Thanks,   -Mickel Baas

## 2020-02-02 NOTE — Telephone Encounter (Signed)
-----   Message from Trinna Post, [place] sent at 02/02/2020 12:21 PM EDT ----- Urine culture grew out a strep organism, though not typically at a number we think is causing infections. However, he is having symptoms so I recommend he complete the antibiotics. Is he feeling better? If not we'd consider changing the medication.

## 2020-02-11 ENCOUNTER — Other Ambulatory Visit: Payer: Self-pay | Admitting: Family Medicine

## 2020-02-11 DIAGNOSIS — N400 Enlarged prostate without lower urinary tract symptoms: Secondary | ICD-10-CM

## 2020-02-11 DIAGNOSIS — N529 Male erectile dysfunction, unspecified: Secondary | ICD-10-CM

## 2020-02-17 DIAGNOSIS — R351 Nocturia: Secondary | ICD-10-CM | POA: Diagnosis not present

## 2020-02-17 DIAGNOSIS — N401 Enlarged prostate with lower urinary tract symptoms: Secondary | ICD-10-CM | POA: Diagnosis not present

## 2020-02-17 DIAGNOSIS — R350 Frequency of micturition: Secondary | ICD-10-CM | POA: Diagnosis not present

## 2020-02-17 DIAGNOSIS — R3915 Urgency of urination: Secondary | ICD-10-CM | POA: Diagnosis not present

## 2020-02-17 DIAGNOSIS — R3912 Poor urinary stream: Secondary | ICD-10-CM | POA: Diagnosis not present

## 2020-03-02 ENCOUNTER — Ambulatory Visit: Payer: Medicare Other | Admitting: Urology

## 2020-03-09 DIAGNOSIS — R3915 Urgency of urination: Secondary | ICD-10-CM | POA: Diagnosis not present

## 2020-03-09 DIAGNOSIS — N3000 Acute cystitis without hematuria: Secondary | ICD-10-CM | POA: Diagnosis not present

## 2020-03-09 DIAGNOSIS — R350 Frequency of micturition: Secondary | ICD-10-CM | POA: Diagnosis not present

## 2020-03-09 DIAGNOSIS — R351 Nocturia: Secondary | ICD-10-CM | POA: Diagnosis not present

## 2020-03-09 DIAGNOSIS — R3912 Poor urinary stream: Secondary | ICD-10-CM | POA: Diagnosis not present

## 2020-03-09 DIAGNOSIS — N401 Enlarged prostate with lower urinary tract symptoms: Secondary | ICD-10-CM | POA: Diagnosis not present

## 2020-03-14 DIAGNOSIS — L570 Actinic keratosis: Secondary | ICD-10-CM | POA: Diagnosis not present

## 2020-03-14 DIAGNOSIS — L905 Scar conditions and fibrosis of skin: Secondary | ICD-10-CM | POA: Diagnosis not present

## 2020-03-14 DIAGNOSIS — Z85820 Personal history of malignant melanoma of skin: Secondary | ICD-10-CM | POA: Diagnosis not present

## 2020-04-04 DIAGNOSIS — R351 Nocturia: Secondary | ICD-10-CM | POA: Diagnosis not present

## 2020-04-04 DIAGNOSIS — R3912 Poor urinary stream: Secondary | ICD-10-CM | POA: Diagnosis not present

## 2020-04-04 DIAGNOSIS — N401 Enlarged prostate with lower urinary tract symptoms: Secondary | ICD-10-CM | POA: Diagnosis not present

## 2020-04-22 ENCOUNTER — Other Ambulatory Visit: Payer: Self-pay | Admitting: Family Medicine

## 2020-04-22 DIAGNOSIS — N529 Male erectile dysfunction, unspecified: Secondary | ICD-10-CM

## 2020-04-22 DIAGNOSIS — N400 Enlarged prostate without lower urinary tract symptoms: Secondary | ICD-10-CM

## 2020-04-22 NOTE — Telephone Encounter (Signed)
Requested medication (s) are due for refill today: yes  Requested medication (s) are on the active medication list: yes  Last refill:  02/15/2020  Future visit scheduled: no  Notes to clinic:  overdue for labs and follow up   Requested Prescriptions  Pending Prescriptions Disp Refills   tadalafil (CIALIS) 5 MG tablet [Pharmacy Med Name: TADALAFIL (CL) TABS 5MG ] 90 tablet 3    Sig: TAKE 1 TABLET DAILY      Urology: Erectile Dysfunction Agents Failed - 04/22/2020  3:25 AM      Failed - Valid encounter within last 12 months    Recent Outpatient Visits           2 months ago Prostatitis, unspecified prostatitis type   [place], Martin, PA-C   8 months ago Prostate cancer screening   [facility] Birdie Sons, MD   2 years ago Urinary frequency   [facility] Birdie Sons, MD   2 years ago Mixed hyperlipidemia   [facility] Birdie Sons, MD   3 years ago Mixed hyperlipidemia   [facility] Birdie Sons, MD              Passed - Last BP in normal range    BP Readings from Last 1 Encounters:  08/25/19 118/60

## 2020-04-28 DIAGNOSIS — R311 Benign essential microscopic hematuria: Secondary | ICD-10-CM | POA: Diagnosis not present

## 2020-04-28 DIAGNOSIS — R350 Frequency of micturition: Secondary | ICD-10-CM | POA: Diagnosis not present

## 2020-05-04 DIAGNOSIS — R3915 Urgency of urination: Secondary | ICD-10-CM | POA: Diagnosis not present

## 2020-05-04 DIAGNOSIS — R350 Frequency of micturition: Secondary | ICD-10-CM | POA: Diagnosis not present

## 2020-07-05 ENCOUNTER — Other Ambulatory Visit: Payer: Self-pay | Admitting: Family Medicine

## 2020-07-05 NOTE — Telephone Encounter (Signed)
Requested medication (s) are due for refill today: Yes  Requested medication (s) are on the active medication list: Yes  Last refill:  04/17/19  Future visit scheduled: No  Notes to clinic:  Unable to refill per protocol, expired Rx     Requested Prescriptions  Pending Prescriptions Disp Refills   allopurinol (ZYLOPRIM) 300 MG tablet [Pharmacy Med Name: ALLOPURINOL TABS 300MG ] 90 tablet 3    Sig: TAKE 1 TABLET DAILY      Endocrinology:  Gout Agents Failed - 07/05/2020  4:32 PM      Failed - Uric Acid in normal range and within 360 days    No results found for: POCURA, LABURIC        Failed - Cr in normal range and within 360 days    Creatinine, Ser  Date Value Ref Range Status  08/25/2019 1.29 (H) 0.76 - 1.27 mg/dL Final          Failed - Valid encounter within last 12 months    Recent Outpatient Visits           5 months ago Prostatitis, unspecified prostatitis type   [place], Story, PA-C   10 months ago Prostate cancer screening   [facility] Birdie Sons, MD   2 years ago Urinary frequency   [facility] Birdie Sons, MD   2 years ago Mixed hyperlipidemia   [facility] Birdie Sons, MD   3 years ago Mixed hyperlipidemia   [place], Kirstie Peri, MD

## 2020-07-08 NOTE — Telephone Encounter (Signed)
L.O.V. was on 01/29/2020 and no upcoming visit. Medication send into pharmacy.

## 2020-07-12 ENCOUNTER — Other Ambulatory Visit: Payer: Self-pay | Admitting: Family Medicine

## 2020-07-12 DIAGNOSIS — N529 Male erectile dysfunction, unspecified: Secondary | ICD-10-CM

## 2020-07-12 DIAGNOSIS — N400 Enlarged prostate without lower urinary tract symptoms: Secondary | ICD-10-CM

## 2020-07-12 MED ORDER — TADALAFIL 5 MG PO TABS: 5.0000 mg | ORAL_TABLET | Freq: Every day | ORAL | 4 refills | Status: DC

## 2020-07-12 NOTE — Telephone Encounter (Signed)
[facility] faxed refill request for the following medications:  tadalafil (CIALIS) 5 MG tablet   Please advise.  Thanks, Larry Crawford

## 2020-07-12 NOTE — Telephone Encounter (Signed)
Please review. Thanks!  

## 2020-07-13 DIAGNOSIS — M1711 Unilateral primary osteoarthritis, right knee: Secondary | ICD-10-CM | POA: Diagnosis not present

## 2020-07-13 DIAGNOSIS — M1712 Unilateral primary osteoarthritis, left knee: Secondary | ICD-10-CM | POA: Diagnosis not present

## 2020-08-03 DIAGNOSIS — M170 Bilateral primary osteoarthritis of knee: Secondary | ICD-10-CM | POA: Diagnosis not present

## 2020-08-04 DIAGNOSIS — N401 Enlarged prostate with lower urinary tract symptoms: Secondary | ICD-10-CM | POA: Diagnosis not present

## 2020-08-04 DIAGNOSIS — R351 Nocturia: Secondary | ICD-10-CM | POA: Diagnosis not present

## 2020-09-05 NOTE — Progress Notes (Signed)
Subjective:   Larry Crawford is a 79 y.o. male who presents for an Initial Medicare Annual Wellness Visit.  I connected with Therese Sarah today by telephone and verified that I am speaking with the correct person using two identifiers. Location patient: home Location provider: work Persons participating in the virtual visit: patient, provider.   I discussed the limitations, risks, security and privacy concerns of performing an evaluation and management service by telephone and the availability of in person appointments. I also discussed with the patient that there may be a patient responsible charge related to this service. The patient expressed understanding and verbally consented to this telephonic visit.    Interactive audio and video telecommunications were attempted between this provider and patient, however failed, due to patient having technical difficulties OR patient did not have access to video capability.  We continued and completed visit with audio only.   Review of Systems    N/A  Cardiac Risk Factors include: advanced age (>66men, >67 women);male gender     Objective:    Today's Vitals   09/06/20 0816  PainSc: 4    There is no height or weight on file to calculate BMI.  Advanced Directives 09/06/2020 06/18/2019 01/10/2018 01/03/2018 11/26/2017 11/20/2016 07/13/2016  Does Patient Have a Medical Advance Directive? Yes Yes No No Yes Yes Yes  Type of Paramedic of [place];Living will [place];Living will - - [place];Living will [place];Living will [place];Living will  Does patient want to make changes to medical advance directive? - No - Patient declined - - - - -  Copy of [place] in Chart? No - copy requested No - copy requested - - No - copy requested No - copy requested No - copy requested  Would patient like information on creating a medical  advance directive? - - No - Patient declined No - Patient declined - - -    Current Medications (verified) Outpatient Encounter Medications as of 09/06/2020  Medication Sig  . allopurinol (ZYLOPRIM) 300 MG tablet TAKE 1 TABLET DAILY  . arginine 500 MG tablet Take 1 tablet by mouth daily.   . cetirizine (ZYRTEC) 10 MG tablet Take 10 mg by mouth daily.  . Cholecalciferol (VITAMIN D3) 5000 units CAPS Take 1 capsule by mouth daily.  . Multiple Vitamins-Minerals (MULTIVITAMIN WITH MINERALS) tablet Take 1 tablet by mouth daily.    . niacin 500 MG tablet Take 500 mg by mouth at bedtime.   . tadalafil (CIALIS) 5 MG tablet Take 1 tablet (5 mg total) by mouth daily.  . vitamin B-12 (CYANOCOBALAMIN) 250 MCG tablet Take 1 tablet by mouth daily.  . Whey Protein POWD Take 4 g by mouth once a week.   . Acetylcysteine 600 MG CAPS Take 1 capsule by mouth daily. (Patient not taking: Reported on 09/06/2020)  . fexofenadine (ALLEGRA) 180 MG tablet Take 180 mg by mouth daily. (Patient not taking: Reported on 09/06/2020)  . GINSENG EXTRACT PO Take by mouth. (Patient not taking: Reported on 09/06/2020)  . HYDROcodone-acetaminophen (NORCO) 5-325 MG tablet Take 1 tablet by mouth every 4 (four) hours as needed for moderate pain. (Patient not taking: Reported on 09/06/2020)  . INOSITOL PO Take 600 mg by mouth daily.   (Patient not taking: Reported on 09/06/2020)  . Kelp 225 MCG TABS Take 1 tablet by mouth 2 (two) times a week. Takes 2-3 times a week  (Patient not taking:  Reported on 09/06/2020)  . Lutein 20 MG CAPS Take 1 capsule by mouth daily.   (Patient not taking: Reported on 09/06/2020)  . Probiotic CAPS Take 1 capsule by mouth daily. (Patient not taking: Reported on 09/06/2020)   No facility-administered encounter medications on file as of 09/06/2020.    Allergies (verified) Tetracyclines & related, Colestipol hcl, Corn-containing products, Pravastatin sodium, Shellfish allergy, Sulfa antibiotics, and Zetia  [ezetimibe]   History: Past Medical History:  Diagnosis Date  . BPH (benign prostatic hypertrophy)   . Colon polyp   . Elevated red blood cell count   . Gout   . History of chicken pox   . History of measles   . History of mumps   . IBS (irritable bowel syndrome)    Past Surgical History:  Procedure Laterality Date  . aortic ultrasound  2013   normal. Screening ultrasound at [facility] per pt report  . 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 BUNIONECTOMY     left  . CHOLECYSTECTOMY  1997  . COLONOSCOPY    . INGUINAL HERNIA REPAIR  [phone]   2 on right/1 on left  . LIPOMA EXCISION  2013  . NOSE SURGERY     divided septum  . POLYPECTOMY    . PROSTATE SURGERY    . [place]  . SHOULDER ARTHROSCOPY  1984 and 1987   left X2 for spurs  . TONSILLECTOMY AND ADENOIDECTOMY  2002  . UMBILICAL HERNIA REPAIR     Family History  Problem Relation Age of Onset  . Hypothyroidism Mother   . Diabetes Brother   . Obesity Brother   . Cancer Brother        throat   Social History   Socioeconomic History  . Marital status: Married    Spouse name: Not on file  . Number of children: 4  . Years of education: Not on file  . Highest education level: 12th grade  Occupational History  . Occupation: retired  Tobacco Use  . Smoking status: Former Smoker    Packs/day: 0.75    Years: 5.00    Pack years: 3.75    Types: Cigarettes    Quit date: 11/19/1958    Years since quitting: 61.8  . Smokeless tobacco: Never Used  Vaping Use  . Vaping Use: Never used  Substance and Sexual Activity  . Alcohol use: Yes    Alcohol/week: 0.0 standard drinks    Comment: 2 glasses of wine a month  . Drug use: No  . Sexual activity: Not on file  Other Topics Concern  . Not on file  Social History Narrative   Pt went to [employer] school through the [employer].    Social Determinants of Health   Financial Resource Strain: Low Risk   . Difficulty of Paying Living Expenses: Not hard at all  Food Insecurity: No  Food Insecurity  . Worried About Charity fundraiser in the Last Year: Never true  . Ran Out of Food in the Last Year: Never true  Transportation Needs: No Transportation Needs  . Lack of Transportation (Medical): No  . Lack of Transportation (Non-Medical): No  Physical Activity: Inactive  . Days of Exercise per Week: 0 days  . Minutes of Exercise per Session: 0 min  Stress: No Stress Concern Present  . Feeling of Stress : Not at all  Social Connections: Moderately Isolated  . Frequency of Communication with Friends and Family: Once a week  .  Frequency of Social Gatherings with Friends and Family: Three times a week  . Attends Religious Services: Never  . Active Member of Clubs or Organizations: No  . Attends Archivist Meetings: Never  . Marital Status: Married    Tobacco Counseling Counseling given: Not Answered   Clinical Intake:  Pre-visit preparation completed: Yes  Pain : 0-10 Pain Score: 4  Pain Type: Chronic pain Pain Location: Knee (and hips and back) Pain Orientation: Right Pain Descriptors / Indicators: Aching Pain Frequency: Intermittent Pain Relieving Factors: Takes ibuprofen as needed for pain.  Pain Relieving Factors: Takes ibuprofen as needed for pain.  Nutritional Risks: None Diabetes: No  How often do you need to have someone help you when you read instructions, pamphlets, or other written materials from your doctor or pharmacy?: 1 - Never  Diabetic? No  Interpreter Needed?: No  Information entered by :: [facility], LPN   Activities of Daily Living In your present state of health, do you have any difficulty performing the following activities: 09/06/2020  Hearing? Y  Comment Has hearing aid but does not wear them.  Vision? N  Difficulty concentrating or making decisions? N  Walking or climbing stairs? Y  Comment Due to knee pains.  Dressing or bathing? N  Doing errands, shopping? N  Preparing Food and eating ? N  Using the  Toilet? N  In the past six months, have you accidently leaked urine? N  Do you have problems with loss of bowel control? N  Managing your Medications? N  Managing your Finances? N  Housekeeping or managing your Housekeeping? N  Some recent data might be hidden    Patient Care Team: Birdie Sons, MD as PCP - General (Family Medicine) Lorelee Cover., MD as Consulting Physician (Ophthalmology) Lucas Mallow, MD as Consulting Physician (Urology) Earnestine Leys, MD (Orthopedic Surgery)  Indicate any recent Medical Services you may have received from other than [facility] providers in the past year (date may be approximate).     Assessment:   This is a routine wellness examination for [place].  Hearing/Vision screen No exam data present  Dietary issues and exercise activities discussed: Current Exercise Habits: The patient does not participate in regular exercise at present, Exercise limited by: orthopedic condition(s)  Goals      Patient Stated   .  I havent seen Dr. Caryn Section in over a year (pt-stated)      Current Barriers:  Marland Kitchen Knowledge Deficits related to importance of yearly physicals and follow ups with primary providers . Fear of COVID-19  Nurse Case Manager Clinical Goal(s):  Marland Kitchen Over the next 7 days, patient will demonstrate improved health management independence as evidenced by calling to schedule yearly physical . Over the next 90 days, patient will complete yearly physical and AWV  Interventions:  . Reviewed chart for last PCP visit note . Advised patient to call PCP office ASAP and schedule his AWV and yearly physical with Dr. Caryn Section (scheduled for 08/25/2019 at 9:00) . Reviewed medications with patient and discussed compliance . Provided emotional support and reassurance regarding Covid 19 and safety of MD office in midst of pandemic . Discussed plans with patient for ongoing care management follow up once he has been seen by PCP and provided patient with direct  contact information for care management team  Patient Self Care Activities:  . Self administers medications as prescribed . Attends all scheduled provider appointments  Initial goal documentation       Other   .  DIET - REDUCE CALORIE INTAKE      Pt to start dieting and go on the HCG diet and lower calorie intake in daily diet.       Depression Screen PHQ 2/9 Scores 09/06/2020 08/25/2019 11/26/2017 11/20/2016 10/04/2015  PHQ - 2 Score 0 0 0 0 4  PHQ- 9 Score - 3 - - 9    Fall Risk Fall Risk  09/06/2020 08/25/2019 11/26/2017 11/20/2016 10/04/2015  Falls in the past year? 0 0 No No No  Number falls in past yr: 0 0 - - -  Injury with Fall? 0 0 - - -  Follow up - Falls evaluation completed - - -    Any stairs in or around the home? Yes  If so, are there any without handrails? Yes  Home free of loose throw rugs in walkways, pet beds, electrical cords, etc? Yes  Adequate lighting in your home to reduce risk of falls? Yes   ASSISTIVE DEVICES UTILIZED TO PREVENT FALLS:  Life alert? No  Use of a cane, walker or w/c? No  Grab bars in the bathroom? No  Shower chair or bench in shower? No  Elevated toilet seat or a handicapped toilet? No    Cognitive Function: Declined today.     6CIT Screen 11/20/2016  What Year? 0 points  What month? 0 points  What time? 0 points  Count back from 20 0 points  Months in reverse 0 points  Repeat phrase 2 points  Total Score 2    Immunizations Immunization History  Administered Date(s) Administered  . Fluad Quad(high Dose 65+) 08/05/2019  . Influenza, High Dose Seasonal PF 09/28/2015, 07/27/2017, 08/14/2018, 08/18/2020  . PFIZER SARS-COV-2 Vaccination 11/23/2019, 12/14/2019  . Pneumococcal Conjugate-13 05/11/2014  . Pneumococcal Polysaccharide-23 07/29/2005  . Rabies, IM 12/27/2017, 12/30/2017, 01/03/2018, 01/10/2018  . Tdap 08/25/2014, 12/27/2017  . Zoster 02/05/2013    TDAP status: Up to date Flu Vaccine status: Up to date Pneumococcal  vaccine status: Up to date Covid-19 vaccine status: Completed vaccines  Qualifies for Shingles Vaccine? Yes   Zostavax completed Yes   Shingrix Completed?: No.    Education has been provided regarding the importance of this vaccine. Patient has been advised to call insurance company to determine out of pocket expense if they have not yet received this vaccine. Advised may also receive vaccine at local pharmacy or Health Dept. Verbalized acceptance and understanding.  Screening Tests Health Maintenance  Topic Date Due  . TETANUS/TDAP  12/28/2027  . INFLUENZA VACCINE  Completed  . COVID-19 Vaccine  Completed  . PNA vac Low Risk Adult  Completed    Health Maintenance  There are no preventive care reminders to display for this patient.  Colorectal cancer screening: No longer required.   Lung Cancer Screening: (Low Dose CT Chest recommended if Age 75-80 years, 30 pack-year currently smoking OR have quit w/in 15years.) does not qualify.   Additional Screening:  Vision Screening: Recommended annual ophthalmology exams for early detection of glaucoma and other disorders of the eye. Is the patient up to date with their annual eye exam?  Yes  Who is the provider or what is the name of the office in which the patient attends annual eye exams? Dr Gloriann Loan If pt is not established with a provider, would they like to be referred to a provider to establish care? No .   Dental Screening: Recommended annual dental exams for proper oral hygiene  Community Resource Referral / Chronic Care Management: CRR required  this visit?  No   CCM required this visit?  No      Plan:     I have personally reviewed and noted the following in the patient's chart:   . Medical and social history . Use of alcohol, tobacco or illicit drugs  . Current medications and supplements . Functional ability and status . Nutritional status . Physical activity . Advanced directives . List of other  physicians . Hospitalizations, surgeries, and ER visits in previous 12 months . Vitals . Screenings to include cognitive, depression, and falls . Referrals and appointments  In addition, I have reviewed and discussed with patient certain preventive protocols, quality metrics, and best practice recommendations. A written personalized care plan for preventive services as well as general preventive health recommendations were provided to patient.     Richerd Grime [place], [place]   [id number]   Nurse Notes: None.

## 2020-09-06 ENCOUNTER — Ambulatory Visit (INDEPENDENT_AMBULATORY_CARE_PROVIDER_SITE_OTHER): Payer: Medicare Other

## 2020-09-06 ENCOUNTER — Other Ambulatory Visit: Payer: Self-pay

## 2020-09-06 DIAGNOSIS — Z0000 Encounter for general adult medical examination without abnormal findings: Secondary | ICD-10-CM | POA: Diagnosis not present

## 2020-09-06 NOTE — Patient Instructions (Signed)
Larry Crawford , Thank you for taking time to come for your Medicare Wellness Visit. I appreciate your ongoing commitment to your health goals. Please review the following plan we discussed and let me know if I can assist you in the future.   Screening recommendations/referrals: Colonoscopy: No longer required.  Recommended yearly ophthalmology/optometry visit for glaucoma screening and checkup Recommended yearly dental visit for hygiene and checkup  Vaccinations: Influenza vaccine: Done 08/18/20. Pneumococcal vaccine: Completed series Tdap vaccine: Up to date, due 12/2027 Shingles vaccine: Shingrix discussed. Please contact your pharmacy for coverage information.     Advanced directives: Please bring a copy of your POA (Power of Attorney) and/or Living Will to your next appointment.   Conditions/risks identified: Pt to start dieting and go on the HCG diet and lower calorie intake in daily diet.   Next appointment: 11/23/20 @ 10:00 AM with Dr Caryn Section. Declined scheduling an AWV for 2022 at this time.   Preventive Care 45 Years and Older, Male Preventive care refers to lifestyle choices and visits with your health care provider that can promote health and wellness. What does preventive care include?  A yearly physical exam. This is also called an annual well check.  Dental exams once or twice a year.  Routine eye exams. Ask your health care provider how often you should have your eyes checked.  Personal lifestyle choices, including:  Daily care of your teeth and gums.  Regular physical activity.  Eating a healthy diet.  Avoiding tobacco and drug use.  Limiting alcohol use.  Practicing safe sex.  Taking low doses of aspirin every day.  Taking vitamin and mineral supplements as recommended by your health care provider. What happens during an annual well check? The services and screenings done by your health care provider during your annual well check will depend on your age,  overall health, lifestyle risk factors, and family history of disease. Counseling  Your health care provider may ask you questions about your:  Alcohol use.  Tobacco use.  Drug use.  Emotional well-being.  Home and relationship well-being.  Sexual activity.  Eating habits.  History of falls.  Memory and ability to understand (cognition).  Work and work Statistician. Screening  You may have the following tests or measurements:  Height, weight, and BMI.  Blood pressure.  Lipid and cholesterol levels. These may be checked every 5 years, or more frequently if you are over 33 years old.  Skin check.  Lung cancer screening. You may have this screening every year starting at age 40 if you have a 30-pack-year history of smoking and currently smoke or have quit within the past 15 years.  Fecal occult blood test (FOBT) of the stool. You may have this test every year starting at age 57.  Flexible sigmoidoscopy or colonoscopy. You may have a sigmoidoscopy every 5 years or a colonoscopy every 10 years starting at age 94.  Prostate cancer screening. Recommendations will vary depending on your family history and other risks.  Hepatitis C blood test.  Hepatitis B blood test.  Sexually transmitted disease (STD) testing.  Diabetes screening. This is done by checking your blood sugar (glucose) after you have not eaten for a while (fasting). You may have this done every 1-3 years.  Abdominal aortic aneurysm (AAA) screening. You may need this if you are a current or former smoker.  Osteoporosis. You may be screened starting at age 69 if you are at high risk. Talk with your health care provider about your  test results, treatment options, and if necessary, the need for more tests. Vaccines  Your health care provider may recommend certain vaccines, such as:  Influenza vaccine. This is recommended every year.  Tetanus, diphtheria, and acellular pertussis (Tdap, Td) vaccine. You may  need a Td booster every 10 years.  Zoster vaccine. You may need this after age 1.  Pneumococcal 13-valent conjugate (PCV13) vaccine. One dose is recommended after age 75.  Pneumococcal polysaccharide (PPSV23) vaccine. One dose is recommended after age 81. Talk to your health care provider about which screenings and vaccines you need and how often you need them. This information is not intended to replace advice given to you by your health care provider. Make sure you discuss any questions you have with your health care provider. Document Released: 12/02/2015 Document Revised: 07/25/2016 Document Reviewed: 09/06/2015 Elsevier Interactive Patient Education  2017 [employer] Prevention in the Home Falls can cause injuries. They can happen to people of all ages. There are many things you can do to make your home safe and to help prevent falls. What can I do on the outside of my home?  Regularly fix the edges of walkways and driveways and fix any cracks.  Remove anything that might make you trip as you walk through a door, such as a raised step or threshold.  Trim any bushes or trees on the path to your home.  Use bright outdoor lighting.  Clear any walking paths of anything that might make someone trip, such as rocks or tools.  Regularly check to see if handrails are loose or broken. Make sure that both sides of any steps have handrails.  Any raised decks and porches should have guardrails on the edges.  Have any leaves, snow, or ice cleared regularly.  Use sand or salt on walking paths during winter.  Clean up any spills in your garage right away. This includes oil or grease spills. What can I do in the bathroom?  Use night lights.  Install grab bars by the toilet and in the tub and shower. Do not use towel bars as grab bars.  Use non-skid mats or decals in the tub or shower.  If you need to sit down in the shower, use a plastic, non-slip stool.  Keep the floor  dry. Clean up any water that spills on the floor as soon as it happens.  Remove soap buildup in the tub or shower regularly.  Attach bath mats securely with double-sided non-slip rug tape.  Do not have throw rugs and other things on the floor that can make you trip. What can I do in the bedroom?  Use night lights.  Make sure that you have a light by your bed that is easy to reach.  Do not use any sheets or blankets that are too big for your bed. They should not hang down onto the floor.  Have a firm chair that has side arms. You can use this for support while you get dressed.  Do not have throw rugs and other things on the floor that can make you trip. What can I do in the kitchen?  Clean up any spills right away.  Avoid walking on wet floors.  Keep items that you use a lot in easy-to-reach places.  If you need to reach something above you, use a strong step stool that has a grab bar.  Keep electrical cords out of the way.  Do not use floor polish or wax that  makes floors slippery. If you must use wax, use non-skid floor wax.  Do not have throw rugs and other things on the floor that can make you trip. What can I do with my stairs?  Do not leave any items on the stairs.  Make sure that there are handrails on both sides of the stairs and use them. Fix handrails that are broken or loose. Make sure that handrails are as long as the stairways.  Check any carpeting to make sure that it is firmly attached to the stairs. Fix any carpet that is loose or worn.  Avoid having throw rugs at the top or bottom of the stairs. If you do have throw rugs, attach them to the floor with carpet tape.  Make sure that you have a light switch at the top of the stairs and the bottom of the stairs. If you do not have them, ask someone to add them for you. What else can I do to help prevent falls?  Wear shoes that:  Do not have high heels.  Have rubber bottoms.  Are comfortable and fit you  well.  Are closed at the toe. Do not wear sandals.  If you use a stepladder:  Make sure that it is fully opened. Do not climb a closed stepladder.  Make sure that both sides of the stepladder are locked into place.  Ask someone to hold it for you, if possible.  Clearly mark and make sure that you can see:  Any grab bars or handrails.  First and last steps.  Where the edge of each step is.  Use tools that help you move around (mobility aids) if they are needed. These include:  Canes.  Walkers.  Scooters.  Crutches.  Turn on the lights when you go into a dark area. Replace any light bulbs as soon as they burn out.  Set up your furniture so you have a clear path. Avoid moving your furniture around.  If any of your floors are uneven, fix them.  If there are any pets around you, be aware of where they are.  Review your medicines with your doctor. Some medicines can make you feel dizzy. This can increase your chance of falling. Ask your doctor what other things that you can do to help prevent falls. This information is not intended to replace advice given to you by your health care provider. Make sure you discuss any questions you have with your health care provider. Document Released: 09/01/2009 Document Revised: 04/12/2016 Document Reviewed: 12/10/2014 Elsevier Interactive Patient Education  2017 [employer].

## 2020-09-13 DIAGNOSIS — D1801 Hemangioma of skin and subcutaneous tissue: Secondary | ICD-10-CM | POA: Diagnosis not present

## 2020-09-13 DIAGNOSIS — Z85820 Personal history of malignant melanoma of skin: Secondary | ICD-10-CM | POA: Diagnosis not present

## 2020-09-13 DIAGNOSIS — L309 Dermatitis, unspecified: Secondary | ICD-10-CM | POA: Diagnosis not present

## 2020-09-13 DIAGNOSIS — L814 Other melanin hyperpigmentation: Secondary | ICD-10-CM | POA: Diagnosis not present

## 2020-09-13 DIAGNOSIS — L905 Scar conditions and fibrosis of skin: Secondary | ICD-10-CM | POA: Diagnosis not present

## 2020-09-13 DIAGNOSIS — L821 Other seborrheic keratosis: Secondary | ICD-10-CM | POA: Diagnosis not present

## 2020-09-13 DIAGNOSIS — L570 Actinic keratosis: Secondary | ICD-10-CM | POA: Diagnosis not present

## 2020-09-13 DIAGNOSIS — L819 Disorder of pigmentation, unspecified: Secondary | ICD-10-CM | POA: Diagnosis not present

## 2020-11-04 DIAGNOSIS — M25561 Pain in right knee: Secondary | ICD-10-CM | POA: Diagnosis not present

## 2020-11-04 DIAGNOSIS — M1711 Unilateral primary osteoarthritis, right knee: Secondary | ICD-10-CM | POA: Diagnosis not present

## 2020-11-04 DIAGNOSIS — G8929 Other chronic pain: Secondary | ICD-10-CM | POA: Diagnosis not present

## 2020-11-04 DIAGNOSIS — S83241A Other tear of medial meniscus, current injury, right knee, initial encounter: Secondary | ICD-10-CM | POA: Diagnosis not present

## 2020-11-07 ENCOUNTER — Other Ambulatory Visit: Payer: Self-pay | Admitting: Student

## 2020-11-07 DIAGNOSIS — S83241A Other tear of medial meniscus, current injury, right knee, initial encounter: Secondary | ICD-10-CM

## 2020-11-07 DIAGNOSIS — M1711 Unilateral primary osteoarthritis, right knee: Secondary | ICD-10-CM

## 2020-11-07 DIAGNOSIS — G8929 Other chronic pain: Secondary | ICD-10-CM

## 2020-11-14 ENCOUNTER — Other Ambulatory Visit: Payer: Medicare Other

## 2020-11-14 ENCOUNTER — Encounter: Payer: Self-pay | Admitting: Family Medicine

## 2020-11-14 ENCOUNTER — Ambulatory Visit (INDEPENDENT_AMBULATORY_CARE_PROVIDER_SITE_OTHER): Payer: Medicare Other | Admitting: Family Medicine

## 2020-11-14 DIAGNOSIS — Z20822 Contact with and (suspected) exposure to covid-19: Secondary | ICD-10-CM

## 2020-11-14 DIAGNOSIS — B309 Viral conjunctivitis, unspecified: Secondary | ICD-10-CM

## 2020-11-14 MED ORDER — CIPROFLOXACIN HCL 0.3 % OP SOLN: 1.0000 [drp] | OPHTHALMIC | 1 refills | Status: AC

## 2020-11-14 NOTE — Progress Notes (Signed)
MyChart Video Visit    Virtual Visit via Video Note   This visit type was conducted due to national recommendations for restrictions regarding the COVID-19 Pandemic (e.g. social distancing) in an effort to limit this patient's exposure and mitigate transmission in our community. This patient is at least at moderate risk for complications without adequate follow up. This format is felt to be most appropriate for this patient at this time. Physical exam was limited by quality of the video and audio technology used for the visit.   Patient location: home Provider location: [facility]  I discussed the limitations of evaluation and management by telemedicine and the availability of in person appointments. The patient expressed understanding and agreed to proceed.  Patient: Larry Crawford   DOB: 05/24/1941   79 y.o. Male  MRN: [id number] Visit Date: 11/14/2020  Today's healthcare provider: Mila Merry, MD   Chief Complaint  Patient presents with   Eye Problem   Subjective    Eye Problem  Both (mostly in the left eye) eyes are affected.Episode onset: 6 days ago. The problem occurs constantly. The problem has been waxing and waning. There was no injury mechanism. There is no known exposure to pink eye. Associated symptoms include an eye discharge (pus like discharge), eye redness and itching. Pertinent negatives include no fever, nausea, recent URI or vomiting. Treatments tried: OTC pink eye drops. The treatment provided no relief.    Symptoms are worse in the mornings, then clear. Minor photosensitivity and blurriness In the morning only.     Medications: Outpatient Medications Prior to Visit  Medication Sig   allopurinol (ZYLOPRIM) 300 MG tablet TAKE 1 TABLET DAILY   cetirizine (ZYRTEC) 10 MG tablet Take 10 mg by mouth daily.   Cholecalciferol (VITAMIN D3) 5000 units CAPS Take 1 capsule by mouth daily.   Kelp 225 MCG TABS Take 1 tablet by mouth 2 (two) times a week. Takes 2-3  times a week   Lutein 20 MG CAPS Take 1 capsule by mouth daily.   Multiple Vitamins-Minerals (MULTIVITAMIN WITH MINERALS) tablet Take 1 tablet by mouth daily.   niacin 500 MG tablet Take 500 mg by mouth at bedtime.   vitamin B-12 (CYANOCOBALAMIN) 250 MCG tablet Take 1 tablet by mouth daily.   Whey Protein POWD Take 4 g by mouth once a week.    Acetylcysteine 600 MG CAPS Take 1 capsule by mouth daily. (Patient not taking: Reported on 09/06/2020)   arginine 500 MG tablet Take 1 tablet by mouth daily.    fexofenadine (ALLEGRA) 180 MG tablet Take 180 mg by mouth daily. (Patient not taking: Reported on 11/14/2020)   GINSENG EXTRACT PO Take by mouth. (Patient not taking: No sig reported)   INOSITOL PO Take 600 mg by mouth daily.   (Patient not taking: Reported on 09/06/2020)   Probiotic CAPS Take 1 capsule by mouth daily. (Patient not taking: No sig reported)   tadalafil (CIALIS) 5 MG tablet Take 1 tablet (5 mg total) by mouth daily.   [DISCONTINUED] HYDROcodone-acetaminophen (NORCO) 5-325 MG tablet Take 1 tablet by mouth every 4 (four) hours as needed for moderate pain. (Patient not taking: No sig reported)   No facility-administered medications prior to visit.    Review of Systems  Constitutional: Negative for appetite change, chills and fever.  Eyes: Positive for discharge (pus like discharge), redness and itching.  Respiratory: Negative for chest tightness, shortness of breath and wheezing.   Cardiovascular: Negative for chest pain and palpitations.  Gastrointestinal: Negative for abdominal pain, nausea and vomiting.      Objective    There were no vitals taken for this visit.   Physical Exam   Awake, alert, oriented x 3. In no apparent distress   Assessment & Plan     1. Acute viral conjunctivitis of left eye  - ciprofloxacin (CILOXAN) 0.3 % ophthalmic solution; Place 1 drop into the left eye every 4 (four) hours while awake for 7 days.  Dispense: 5 mL; Refill: 1    Call for referral to ophthalmology if not improving in 2-3 more days.       I discussed the assessment and treatment plan with the patient. The patient was provided an opportunity to ask questions and all were answered. The patient agreed with the plan and demonstrated an understanding of the instructions.   The patient was advised to call back or seek an in-person evaluation if the symptoms worsen or if the condition fails to improve as anticipated.  I provided 8 minutes of non-face-to-face time during this encounter.  The entirety of the information documented in the History of Present Illness, Review of Systems and Physical Exam were personally obtained by me. Portions of this information were initially documented by the CMA and reviewed by me for thoroughness and accuracy.     Lelon Huh, MD [facility] [phone] (phone) [phone] (fax)  [place]

## 2020-11-15 LAB — SARS-COV-2, NAA 2 DAY TAT

## 2020-11-15 LAB — NOVEL CORONAVIRUS, NAA: SARS-CoV-2, NAA: NOT DETECTED

## 2020-11-16 ENCOUNTER — Other Ambulatory Visit: Payer: Self-pay

## 2020-11-16 ENCOUNTER — Ambulatory Visit
Admission: RE | Admit: 2020-11-16 | Discharge: 2020-11-16 | Disposition: A | Discharge status: Home / Self Care | Payer: Medicare Other | Source: Ambulatory Visit | Attending: Student | Admitting: Student

## 2020-11-16 DIAGNOSIS — M1711 Unilateral primary osteoarthritis, right knee: Secondary | ICD-10-CM | POA: Insufficient documentation

## 2020-11-16 DIAGNOSIS — S83241A Other tear of medial meniscus, current injury, right knee, initial encounter: Secondary | ICD-10-CM | POA: Insufficient documentation

## 2020-11-16 DIAGNOSIS — M25561 Pain in right knee: Secondary | ICD-10-CM | POA: Insufficient documentation

## 2020-11-16 DIAGNOSIS — G8929 Other chronic pain: Secondary | ICD-10-CM | POA: Insufficient documentation

## 2020-11-16 IMAGING — MR MR KNEE*R* W/O CM
6 series · 40 of 40 positions shown · non-contrast
Comparison: Right knee x-rays dated [DATE].

CLINICAL DATA: Chronic right knee pain. No injury or prior surgery.

EXAM:
MRI OF THE RIGHT KNEE WITHOUT CONTRAST
TECHNIQUE: Multiplanar, multisequence MR imaging of the knee was performed. No
intravenous contrast was administered.

[Series 15: T2 fat-sat · axial · left · 4.0mm · 0.50mm/px · z∈[-76,+49]mm · 7 of 26 slices shown (1 of 3)]
[im 1/26]
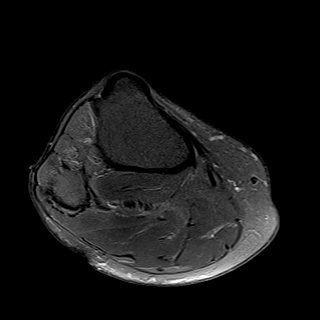
[im 5/26]
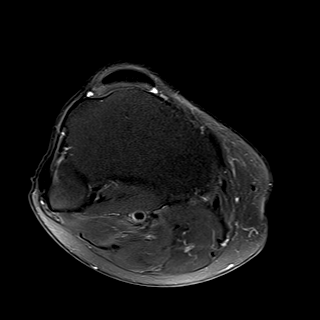
[im 9/26]
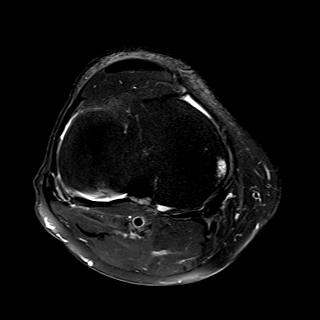
[im 13/26]
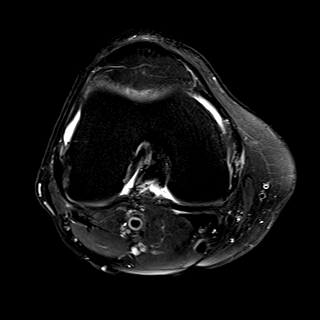
[im 17/26]
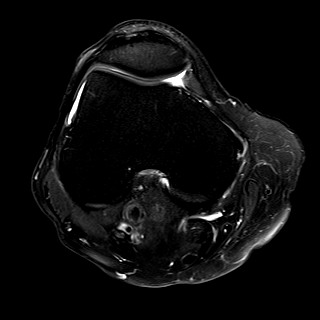
[im 21/26]
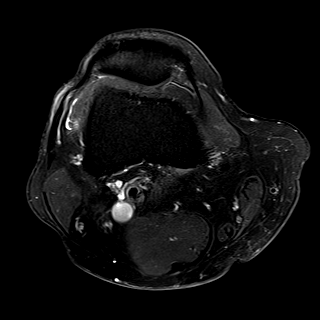
[im 26/26]
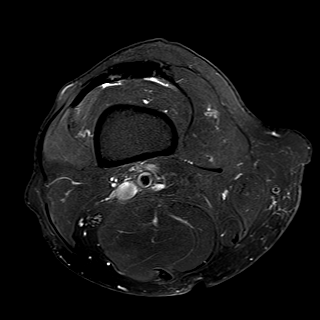

[Series 16: T2 fat-sat · coronal · left · 4.0mm · 0.59mm/px · 7 of 26 slices shown (2 of 3)]
[im 1/26]
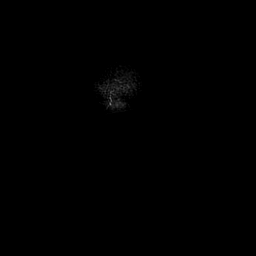
[im 5/26]
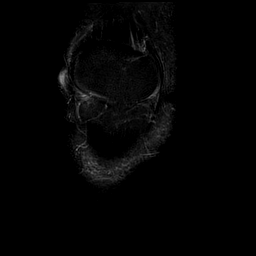
[im 9/26]
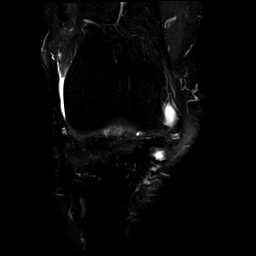
[im 13/26]
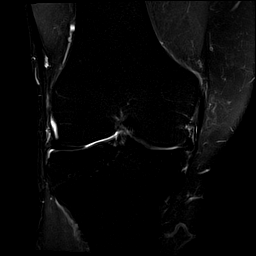
[im 17/26]
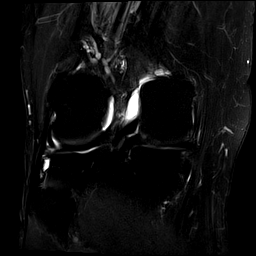
[im 21/26]
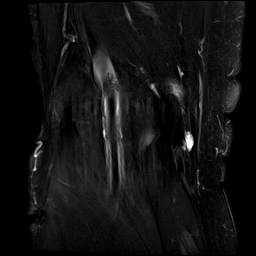
[im 26/26]
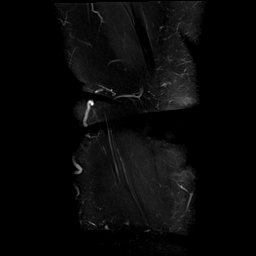

[Series 17: T1 · coronal · left · 4.0mm · 0.59mm/px · 6 of 26 slices shown]
[im 1/26]
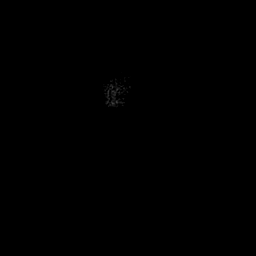
[im 6/26]
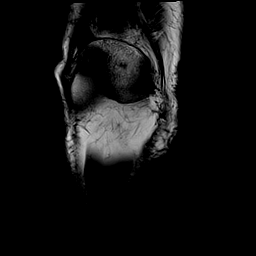
[im 11/26]
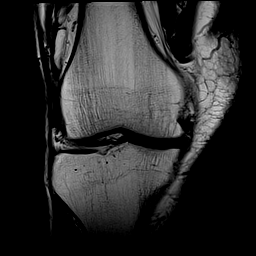
[im 16/26]
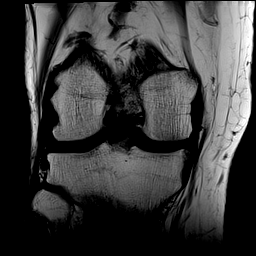
[im 21/26]
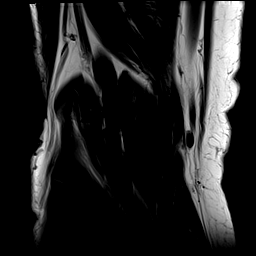
[im 26/26]
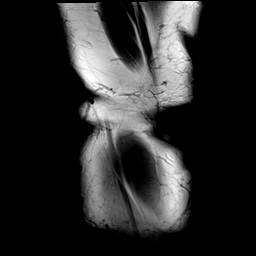

[Series 18: PD fat-sat · coronal · left · 4.0mm · 0.59mm/px · 6 of 26 slices shown (1 of 2)]
[im 1/26]
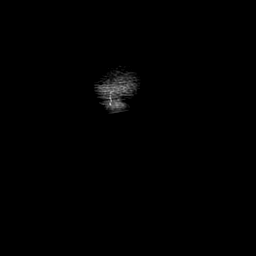
[im 6/26]
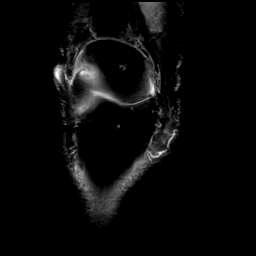
[im 11/26]
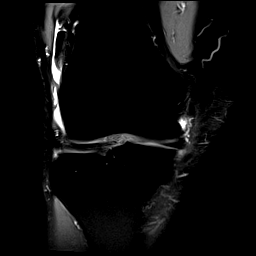
[im 16/26]
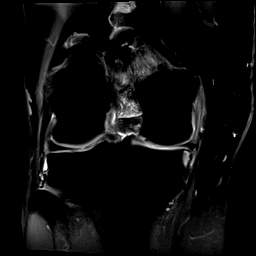
[im 21/26]
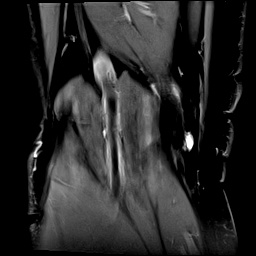
[im 26/26]
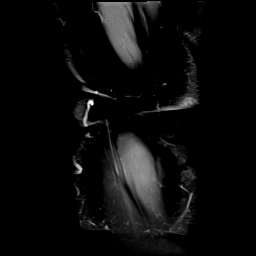

[Series 19: PD fat-sat · sagittal · left · 3.0mm · 0.59mm/px · 7 of 29 slices shown (2 of 2)]
[im 1/29]
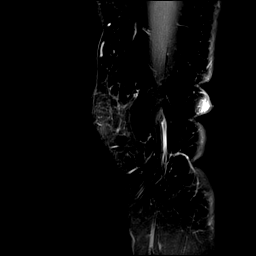
[im 5/29]
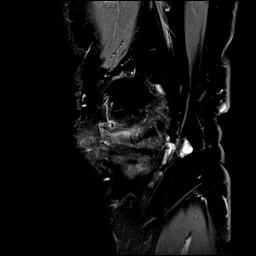
[im 10/29]
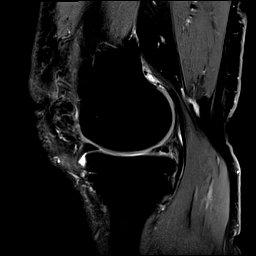
[im 15/29]
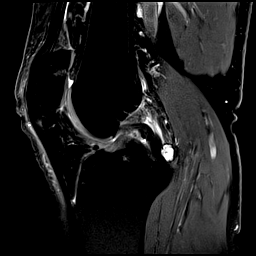
[im 19/29]
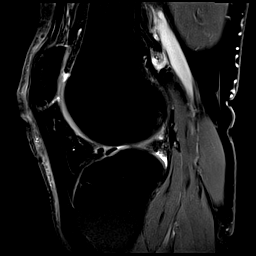
[im 24/29]
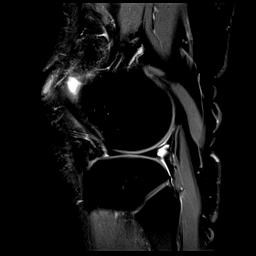
[im 29/29]
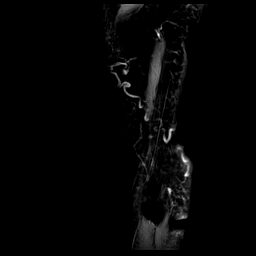

[Series 20: T2 fat-sat · sagittal · left · 3.0mm · 0.59mm/px · 7 of 30 slices shown (3 of 3)]
[im 1/30]
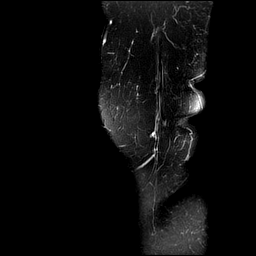
[im 5/30]
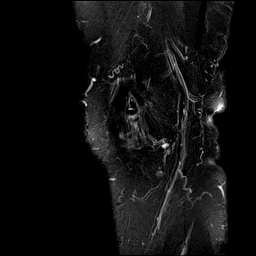
[im 10/30]
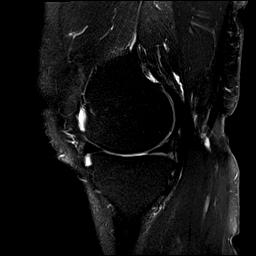
[im 15/30]
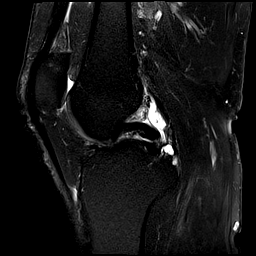
[im 20/30]
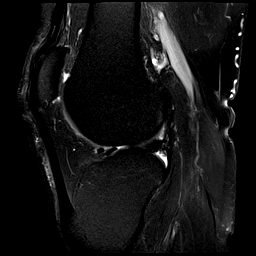
[im 25/30]
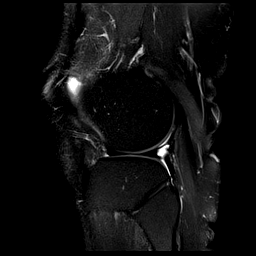
[im 30/30]
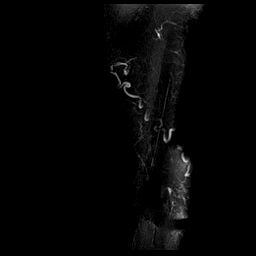

[40 of 40 positions shown; findings below may reference images not displayed]

FINDINGS: MENISCI

Medial meniscus: Complex tear of the posterior horn near the root
with radial and longitudinal components. 1.8 cm parameniscal cyst
extending from the posterior horn centrally to behind the PCL
(series 19, images 14-18). Degenerated body and posterior root

Lateral meniscus:  Intact.

LIGAMENTS

Cruciates:  Intact ACL and PCL.

Collaterals: Thickening and increased intermediate signal of the
proximal MCL with small focus of intrasubstance calcium
hydroxyapatite deposition (series 16, image 12). The lateral
collateral ligament complex is intact.

CARTILAGE

Patellofemoral: Focal full-thickness cartilage loss over the
patellar apex with underlying subchondral marrow edema.

Medial: Near full-thickness cartilage loss over the central
weight-bearing medial femoral condyle and medial tibial plateau.
Degenerative subchondral marrow edema in the peripheral medial
tibial plateau.

Lateral: Near full-thickness cartilage loss over the posterior
lateral tibial plateau.

Joint:  No significant joint effusion.  Normal Hoffa's fat.

Popliteal Fossa:  Tiny Baker cyst.  Intact popliteus tendon.

Extensor Mechanism: Intact quadriceps tendon and patellar tendon.
Intact medial and lateral patellar retinaculum. Intact MPFL.

Bones: No acute fracture or dislocation. No suspicious bone lesion.
Small tricompartmental marginal osteophytes.

Other: None.
IMPRESSION: 1. Complex tear of the medial meniscus posterior horn near the root
with radial and longitudinal components. 1.8 cm parameniscal cyst.
2. Proximal MCL hydroxyapatite deposition with mild inflammatory
change.
3. Tricompartmental osteoarthritis, mild to moderate in the medial
compartment.

## 2020-11-21 DIAGNOSIS — H16223 Keratoconjunctivitis sicca, not specified as Sjogren's, bilateral: Secondary | ICD-10-CM | POA: Diagnosis not present

## 2020-11-23 ENCOUNTER — Ambulatory Visit (INDEPENDENT_AMBULATORY_CARE_PROVIDER_SITE_OTHER): Payer: Medicare Other | Admitting: Family Medicine

## 2020-11-23 ENCOUNTER — Other Ambulatory Visit: Payer: Self-pay

## 2020-11-23 ENCOUNTER — Encounter: Payer: Self-pay | Admitting: Family Medicine

## 2020-11-23 VITALS — BP 129/80 | HR 79 | Temp 98.2°F | Resp 16 | Ht 68.0 in | Wt 198.0 lb

## 2020-11-23 DIAGNOSIS — E782 Mixed hyperlipidemia: Secondary | ICD-10-CM | POA: Diagnosis not present

## 2020-11-23 DIAGNOSIS — E559 Vitamin D deficiency, unspecified: Secondary | ICD-10-CM

## 2020-11-23 NOTE — Patient Instructions (Signed)
.   Please review the attached list of medications and notify my office if there are any errors.   . Please bring all of your medications to every appointment so we can make sure that our medication list is the same as yours.   

## 2020-11-23 NOTE — Progress Notes (Signed)
Established patient visit   Patient: Larry Crawford   DOB: 12-20-1940   80 y.o. Male  MRN: [id number] Visit Date: 11/23/2020  Today's healthcare provider: Mila Merry, MD   Chief Complaint  Patient presents with  . Hyperlipidemia  . Osteoarthritis   Subjective    HPI  Had AWV with [facility] on 09/06/2020.    Lipid/Cholesterol, Follow-up  Last lipid panel Other pertinent labs  Lab Results  Component Value Date   CHOL 189 08/25/2019   HDL 39 (L) 08/25/2019   LDLCALC 131 (H) 08/25/2019   TRIG 105 08/25/2019   CHOLHDL 4.8 08/25/2019   Lab Results  Component Value Date   ALT 14 08/25/2019   AST 20 08/25/2019   PLT 412 (H) 10/04/2015   TSH 1.950 12/13/2016     He was last seen for this 08/25/2019.  Management since that visit includes continue health diet.  He reports fair compliance with treatment. He is not having side effects.   Symptoms: No chest pain No chest pressure/discomfort  No dyspnea No lower extremity edema  No numbness or tingling of extremity No orthopnea  No palpitations No paroxysmal nocturnal dyspnea  No speech difficulty No syncope   Current diet: in general, a "healthy" diet   Current exercise: walking  The ASCVD Risk score Denman George DC Jr., et al., 2013) failed to calculate for the following reasons:   The 2013 ASCVD risk score is only valid for ages 74 to 5  ---------------------------------------------------------------------------------------------------  Follow up for Primary osteoarthritis of right knee:  The patient was last seen for this 08/25/2019.   During that visit a refill for HYDROcodone-acetaminophen (NORCO) 5-325 MG tablet was provided.  He reports good compliance with treatment. Patient has been out of pain medication. He feels that condition is Worse. He is not having side effects.   -----------------------------------------------------------------------------------------       Medications: Outpatient Medications  Prior to Visit  Medication Sig  . allopurinol (ZYLOPRIM) 300 MG tablet TAKE 1 TABLET DAILY  . arginine 500 MG tablet Take 2 tablets by mouth daily.  . cetirizine (ZYRTEC) 10 MG tablet Take 10 mg by mouth daily.  . Cholecalciferol (VITAMIN D3) 5000 units CAPS Take 1 capsule by mouth daily.  Marland Kitchen Kelp 225 MCG TABS Take 1 tablet by mouth 2 (two) times a week. Takes 2-3 times a week  . Lutein 20 MG CAPS Take 1 capsule by mouth daily.  . Multiple Vitamins-Minerals (MULTIVITAMIN WITH MINERALS) tablet Take 1 tablet by mouth daily.  . niacin 500 MG tablet Take 500 mg by mouth at bedtime.  . tadalafil (CIALIS) 5 MG tablet Take 1 tablet (5 mg total) by mouth daily.  . vitamin B-12 (CYANOCOBALAMIN) 250 MCG tablet Take 1 tablet by mouth daily.  . Whey Protein POWD Take 4 g by mouth once a week.   . Acetylcysteine 600 MG CAPS Take 1 capsule by mouth daily. (Patient not taking: No sig reported)  . [DISCONTINUED] fexofenadine (ALLEGRA) 180 MG tablet Take 180 mg by mouth daily. (Patient not taking: No sig reported)  . [DISCONTINUED] GINSENG EXTRACT PO Take by mouth. (Patient not taking: No sig reported)  . [DISCONTINUED] INOSITOL PO Take 600 mg by mouth daily.   (Patient not taking: No sig reported)  . [DISCONTINUED] Probiotic CAPS Take 1 capsule by mouth daily. (Patient not taking: No sig reported)   No facility-administered medications prior to visit.    Review of Systems  Constitutional: Positive for activity change, fatigue  and unexpected weight change. Negative for appetite change, chills and fever.  HENT: Positive for sneezing, tinnitus and voice change. Negative for congestion, ear pain, hearing loss, nosebleeds and trouble swallowing.   Eyes: Positive for discharge and itching. Negative for pain and visual disturbance.  Respiratory: Positive for chest tightness and shortness of breath. Negative for cough.   Cardiovascular: Negative for chest pain, palpitations and leg swelling.  Gastrointestinal:  Positive for abdominal distention. Negative for abdominal pain, blood in stool, constipation, diarrhea, nausea and vomiting.  Endocrine: Positive for cold intolerance. Negative for polydipsia, polyphagia and polyuria.  Genitourinary: Negative for dysuria and flank pain.  Musculoskeletal: Positive for arthralgias, neck pain and neck stiffness. Negative for back pain, joint swelling and myalgias.  Skin: Negative for color change, rash and wound.  Allergic/Immunologic: Positive for food allergies.  Neurological: Negative for dizziness, tremors, seizures, speech difficulty, weakness, light-headedness and headaches.  Hematological: Bruises/bleeds easily.  Psychiatric/Behavioral: Positive for dysphoric mood. Negative for behavioral problems, confusion, decreased concentration and sleep disturbance. The patient is not nervous/anxious.   All other systems reviewed and are negative.     Objective    BP 129/80 (BP Location: Left Arm, Patient Position: Sitting, Cuff Size: Large)   Pulse 79   Temp 98.2 F (36.8 C) (Temporal)   Resp 16   Ht 5\' 8"  (1.727 m)   Wt 198 lb (89.8 kg)   SpO2 98% Comment: room air  BMI 30.11 kg/m    Physical Exam    General: Appearance:    Mildly obese male in no acute distress  Eyes:    PERRL, conjunctiva/corneas clear, EOM's intact       Lungs:     Clear to auscultation bilaterally, respirations unlabored  Heart:    Normal heart rate. Normal rhythm. No murmurs, rubs, or gallops.   MS:   All extremities are intact.   Neurologic:   Awake, alert, oriented x 3. No apparent focal neurological           defect.          Assessment & Plan     1. Mixed hyperlipidemia Encouraged prudent diet and continue walking regiment.  - CBC - Comprehensive metabolic panel - Lipid panel  2. Vitamin D deficiency  - VITAMIN D 25 Hydroxy (Vit-D Deficiency, Fractures)   No follow-ups on file.      The entirety of the information documented in the History of Present  Illness, Review of Systems and Physical Exam were personally obtained by me. Portions of this information were initially documented by the CMA and reviewed by me for thoroughness and accuracy.      Lelon Huh, MD  [facility] [phone] (phone) [phone] (fax)  [place]

## 2020-11-24 LAB — COMPREHENSIVE METABOLIC PANEL
ALT: 17 IU/L (ref 0–44)
AST: 15 IU/L (ref 0–40)
Albumin/Globulin Ratio: 2.1 (ref 1.2–2.2)
Albumin: 4.6 g/dL (ref 3.7–4.7)
Alkaline Phosphatase: 86 IU/L (ref 44–121)
BUN/Creatinine Ratio: 16 (ref 10–24)
BUN: 18 mg/dL (ref 8–27)
Bilirubin Total: 0.4 mg/dL (ref 0.0–1.2)
CO2: 23 mmol/L (ref 20–29)
Calcium: 9.9 mg/dL (ref 8.6–10.2)
Chloride: 95 mmol/L — ABNORMAL LOW (ref 96–106)
Creatinine, Ser: 1.15 mg/dL (ref 0.76–1.27)
GFR calc Af Amer: 69 mL/min/{1.73_m2} (ref >59)
GFR calc non Af Amer: 60 mL/min/{1.73_m2} (ref >59)
Globulin, Total: 2.2 g/dL (ref 1.5–4.5)
Glucose: 94 mg/dL (ref 65–99)
Potassium: 4.5 mmol/L (ref 3.5–5.2)
Sodium: 132 mmol/L — ABNORMAL LOW (ref 134–144)
Total Protein: 6.8 g/dL (ref 6.0–8.5)

## 2020-11-24 LAB — CBC
Hematocrit: 39.6 % (ref 37.5–51.0)
Hemoglobin: 14 g/dL (ref 13.0–17.7)
MCH: 31.6 pg (ref 26.6–33.0)
MCHC: 35.4 g/dL (ref 31.5–35.7)
MCV: 89 fL (ref 79–97)
Platelets: 399 10*3/uL (ref 150–450)
RBC: 4.43 x10E6/uL (ref 4.14–5.80)
RDW: 12.9 % (ref 11.6–15.4)
WBC: 8.8 10*3/uL (ref 3.4–10.8)

## 2020-11-24 LAB — LIPID PANEL
Chol/HDL Ratio: 6.4 ratio — ABNORMAL HIGH (ref 0.0–5.0)
Cholesterol, Total: 289 mg/dL — ABNORMAL HIGH (ref 100–199)
HDL: 45 mg/dL (ref >39)
LDL Chol Calc (NIH): 194 mg/dL — ABNORMAL HIGH (ref 0–99)
Triglycerides: 258 mg/dL — ABNORMAL HIGH (ref 0–149)
VLDL Cholesterol Cal: 50 mg/dL — ABNORMAL HIGH (ref 5–40)

## 2020-11-24 LAB — VITAMIN D 25 HYDROXY (VIT D DEFICIENCY, FRACTURES): Vit D, 25-Hydroxy: 44 ng/mL (ref 30.0–100.0)

## 2020-11-28 DIAGNOSIS — M1711 Unilateral primary osteoarthritis, right knee: Secondary | ICD-10-CM | POA: Diagnosis not present

## 2020-11-28 DIAGNOSIS — S83231A Complex tear of medial meniscus, current injury, right knee, initial encounter: Secondary | ICD-10-CM | POA: Diagnosis not present

## 2020-11-30 ENCOUNTER — Other Ambulatory Visit: Payer: Self-pay | Admitting: Family Medicine

## 2020-12-01 MED ORDER — COLESEVELAM HCL 625 MG PO TABS
1875.0000 mg | ORAL_TABLET | Freq: Two times a day (BID) | ORAL | 3 refills | Status: DC
Start: 2020-12-01 — End: 2021-01-03

## 2020-12-08 DIAGNOSIS — H353131 Nonexudative age-related macular degeneration, bilateral, early dry stage: Secondary | ICD-10-CM | POA: Diagnosis not present

## 2020-12-27 ENCOUNTER — Other Ambulatory Visit: Payer: Self-pay | Admitting: Surgery

## 2021-01-03 ENCOUNTER — Other Ambulatory Visit: Payer: Self-pay

## 2021-01-03 ENCOUNTER — Encounter
Admission: RE | Admit: 2021-01-03 | Discharge: 2021-01-03 | Disposition: A | Discharge status: Home / Self Care | Payer: Medicare Other | Source: Ambulatory Visit | Attending: Surgery | Admitting: Surgery

## 2021-01-03 DIAGNOSIS — S83231A Complex tear of medial meniscus, current injury, right knee, initial encounter: Secondary | ICD-10-CM | POA: Insufficient documentation

## 2021-01-03 DIAGNOSIS — Z01818 Encounter for other preprocedural examination: Secondary | ICD-10-CM | POA: Diagnosis not present

## 2021-01-03 DIAGNOSIS — M1711 Unilateral primary osteoarthritis, right knee: Secondary | ICD-10-CM | POA: Diagnosis not present

## 2021-01-03 DIAGNOSIS — Z136 Encounter for screening for cardiovascular disorders: Secondary | ICD-10-CM | POA: Diagnosis not present

## 2021-01-03 HISTORY — DX: Male erectile dysfunction, unspecified: N52.9

## 2021-01-03 LAB — COMPREHENSIVE METABOLIC PANEL
ALT: 18 U/L (ref 0–44)
AST: 19 U/L (ref 15–41)
Albumin: 4.3 g/dL (ref 3.5–5.0)
Alkaline Phosphatase: 60 U/L (ref 38–126)
Anion gap: 10 (ref 5–15)
BUN: 19 mg/dL (ref 8–23)
CO2: 25 mmol/L (ref 22–32)
Calcium: 9.8 mg/dL (ref 8.9–10.3)
Chloride: 100 mmol/L (ref 98–111)
Creatinine, Ser: 1.05 mg/dL (ref 0.61–1.24)
GFR, Estimated: >60 mL/min (ref >60)
Glucose, Bld: 115 mg/dL — ABNORMAL HIGH (ref 70–99)
Potassium: 4.1 mmol/L (ref 3.5–5.1)
Sodium: 135 mmol/L (ref 135–145)
Total Bilirubin: 0.6 mg/dL (ref 0.3–1.2)
Total Protein: 7.1 g/dL (ref 6.5–8.1)

## 2021-01-03 LAB — URINALYSIS, ROUTINE W REFLEX MICROSCOPIC
Bilirubin Urine: NEGATIVE
Glucose, UA: NEGATIVE mg/dL
Hgb urine dipstick: NEGATIVE
Ketones, ur: NEGATIVE mg/dL
Leukocytes,Ua: NEGATIVE
Nitrite: NEGATIVE
Protein, ur: NEGATIVE mg/dL
Specific Gravity, Urine: 1.01 (ref 1.005–1.030)
pH: 6 (ref 5.0–8.0)

## 2021-01-03 LAB — CBC WITH DIFFERENTIAL/PLATELET
Abs Immature Granulocytes: 0.03 10*3/uL (ref 0.00–0.07)
Basophils Absolute: 0.1 10*3/uL (ref 0.0–0.1)
Basophils Relative: 1 %
Eosinophils Absolute: 0.4 10*3/uL (ref 0.0–0.5)
Eosinophils Relative: 5 %
HCT: 40.6 % (ref 39.0–52.0)
Hemoglobin: 13.6 g/dL (ref 13.0–17.0)
Immature Granulocytes: 0 %
Lymphocytes Relative: 28 %
Lymphs Abs: 2 10*3/uL (ref 0.7–4.0)
MCH: 30.6 pg (ref 26.0–34.0)
MCHC: 33.5 g/dL (ref 30.0–36.0)
MCV: 91.2 fL (ref 80.0–100.0)
Monocytes Absolute: 0.7 10*3/uL (ref 0.1–1.0)
Monocytes Relative: 10 %
Neutro Abs: 4 10*3/uL (ref 1.7–7.7)
Neutrophils Relative %: 56 %
Platelets: 402 10*3/uL — ABNORMAL HIGH (ref 150–400)
RBC: 4.45 MIL/uL (ref 4.22–5.81)
RDW: 13.3 % (ref 11.5–15.5)
WBC: 7.2 10*3/uL (ref 4.0–10.5)
nRBC: 0 % (ref 0.0–0.2)

## 2021-01-03 LAB — SURGICAL PCR SCREEN
MRSA, PCR: NEGATIVE
Staphylococcus aureus: POSITIVE — AB

## 2021-01-03 NOTE — Patient Instructions (Addendum)
Your procedure is scheduled on:  Tuesday, February 22 Report to the Registration Desk on the 1st floor of the [facility]. To find out your arrival time, please call [phone] between 1PM - 3PM on: Monday, February 21  REMEMBER: Instructions that are not followed completely may result in serious medical risk, up to and including death; or upon the discretion of your surgeon and anesthesiologist your surgery may need to be rescheduled.  Do not eat food after midnight the night before surgery.  No gum chewing, lozengers or hard candies.  You may however, drink CLEAR liquids up to 2 hours before you are scheduled to arrive for your surgery. Do not drink anything within 2 hours of your scheduled arrival time.  Clear liquids include: - water  - apple juice without pulp - gatorade (not RED, PURPLE, OR BLUE) - black coffee or tea (Do NOT add milk or creamers to the coffee or tea) Do NOT drink anything that is not on this list.  In addition, your doctor has ordered for you to drink the provided  Ensure Pre-Surgery Clear Carbohydrate Drink  Drinking this carbohydrate drink up to two hours before surgery helps to reduce insulin resistance and improve patient outcomes. Please complete drinking 2 hours prior to scheduled arrival time.  TAKE THESE MEDICATIONS THE MORNING OF SURGERY WITH A SIP OF WATER:  1.  Famotidine (Pepcid) - (take one the night before and one on the morning of surgery - helps to prevent nausea after surgery.)  One week prior to surgery: starting February 15 Stop Anti-inflammatories (NSAIDS) such as Advil, Aleve, Ibuprofen, Motrin, Naproxen, Naprosyn and Aspirin based products such as Excedrin, Goodys Powder, BC Powder. Stop ANY OVER THE COUNTER supplements until after surgery.  No Alcohol for 24 hours before or after surgery.  No Smoking including e-cigarettes for 24 hours prior to surgery.  No chewable tobacco products for at least 6 hours prior to surgery.  No  nicotine patches on the day of surgery.  Do not use any "recreational" drugs for at least a week prior to your surgery.  Please be advised that the combination of cocaine and anesthesia may have negative outcomes, up to and including death. If you test positive for cocaine, your surgery will be cancelled.  On the morning of surgery brush your teeth with toothpaste and water, you may rinse your mouth with mouthwash if you wish. Do not swallow any toothpaste or mouthwash.  Do not wear jewelry, make-up, hairpins, clips or nail polish.  Do not wear lotions, powders, or perfumes.   Do not shave body from the neck down 48 hours prior to surgery just in case you cut yourself which could leave a site for infection.  Also, freshly shaved skin may become irritated if using the CHG soap.  Contact lenses, hearing aids and dentures may not be worn into surgery.  Do not bring valuables to the hospital. [facility] is not responsible for any missing/lost belongings or valuables.   Use CHG Soap as directed on instruction sheet.  Notify your doctor if there is any change in your medical condition (cold, fever, infection).  Wear comfortable clothing (specific to your surgery type) to the hospital.  Plan for stool softeners for home use; pain medications have a tendency to cause constipation. You can also help prevent constipation by eating foods high in fiber such as fruits and vegetables and drinking plenty of fluids as your diet allows.  After surgery, you can help prevent lung complications  by doing breathing exercises.  Take deep breaths and cough every 1-2 hours. Your doctor may order a device called an Incentive Spirometer to help you take deep breaths.  If you are being discharged the day of surgery, you will not be allowed to drive home. You will need a responsible adult (18 years or older) to drive you home and stay with you that night.   If you are taking public transportation, you will  need to have a responsible adult (18 years or older) with you. Please confirm with your physician that it is acceptable to use public transportation.   Please call the [place] Dept. at [phone] if you have any questions about these instructions.  Visitation Policy:  Patients undergoing a surgery or procedure may have one family member or support person with them as long as that person is not COVID-19 positive or experiencing its symptoms.  That person may remain in the waiting area during the procedure.

## 2021-01-03 NOTE — Progress Notes (Signed)
  Perioperative Services Pre-Admission/Anesthesia Testing   Date: 01/03/21 Name: Larry Crawford MRN:   [id number]  Re: Consideration of preoperative prophylactic antibiotic change   Request sent to: Poggi, Marshall Cork, MD (routed and/or faxed via [facility])  Planned Surgical Procedure(s):    Case: [id number] Date/Time: 01/10/21 1011   Procedure: UNICOMPARTMENTAL KNEE (Right Knee)   Anesthesia type: Choice   Pre-op diagnosis:      Complex tear of medial meniscus of right knee as current injury, initial encounter S83.231A     Primary osteoarthritis of right knee M17.11   Location: [facility] [place] [facility]   Surgeons: Corky Mull, MD    Notes: 1. Patient has NO documented allergy to PCN   2. Screened as appropriate for cephalosporin use during medication reconciliation . No immediate angioedema, dysphagia, SOB, anaphylaxis symptoms. . No severe rash involving mucous membranes or skin necrosis. . No hospital admissions related to side effects of PCN/cephalosporin use.  . No documented reaction to PCN or cephalosporin in the last 10 years.  Request:  As an evidence based approach to reducing the rate of incidence for post-operative SSI and the development of MDROs, could an agent with narrower coverage for preoperative prophylaxis in this patient's upcoming surgical course be considered?   1. Currently ordered preoperative prophylactic ABX: vancomycin.   2. Specifically requesting change to cephalosporin (CEFAZOLIN).   3. Please communicate decision with me and I will change the orders in Epic as per your direction.   Honor Loh, MSN, APRN, FNP-C, CEN [facility]  Peri-operative Services Nurse Practitioner FAX: [phone] 01/03/21 12:02 PM

## 2021-01-06 ENCOUNTER — Other Ambulatory Visit
Admission: RE | Admit: 2021-01-06 | Discharge: 2021-01-06 | Disposition: A | Discharge status: Home / Self Care | Payer: Medicare Other | Source: Ambulatory Visit | Attending: Surgery | Admitting: Surgery

## 2021-01-06 ENCOUNTER — Other Ambulatory Visit: Payer: Self-pay

## 2021-01-06 DIAGNOSIS — Z01812 Encounter for preprocedural laboratory examination: Secondary | ICD-10-CM | POA: Insufficient documentation

## 2021-01-06 DIAGNOSIS — Z20822 Contact with and (suspected) exposure to covid-19: Secondary | ICD-10-CM | POA: Diagnosis not present

## 2021-01-06 LAB — SARS CORONAVIRUS 2 (TAT 6-24 HRS): SARS Coronavirus 2: NEGATIVE

## 2021-01-10 ENCOUNTER — Ambulatory Visit: Payer: Medicare Other

## 2021-01-10 ENCOUNTER — Ambulatory Visit: Payer: Medicare Other | Admitting: Certified Registered Nurse Anesthetist

## 2021-01-10 ENCOUNTER — Encounter
Admission: RE | Disposition: A | Discharge status: Home Health | Payer: Self-pay | Source: Home / Self Care | Attending: Surgery

## 2021-01-10 ENCOUNTER — Encounter: Payer: Self-pay | Admitting: Surgery

## 2021-01-10 ENCOUNTER — Ambulatory Visit
Admission: RE | Admit: 2021-01-10 | Discharge: 2021-01-10 | Disposition: A | Discharge status: Home Health | Payer: Medicare Other | Attending: Surgery | Admitting: Surgery

## 2021-01-10 DIAGNOSIS — Z471 Aftercare following joint replacement surgery: Secondary | ICD-10-CM | POA: Diagnosis not present

## 2021-01-10 DIAGNOSIS — Z87891 Personal history of nicotine dependence: Secondary | ICD-10-CM | POA: Insufficient documentation

## 2021-01-10 DIAGNOSIS — Z96651 Presence of right artificial knee joint: Secondary | ICD-10-CM | POA: Diagnosis not present

## 2021-01-10 DIAGNOSIS — M25761 Osteophyte, right knee: Secondary | ICD-10-CM | POA: Insufficient documentation

## 2021-01-10 DIAGNOSIS — Z91013 Allergy to seafood: Secondary | ICD-10-CM | POA: Insufficient documentation

## 2021-01-10 DIAGNOSIS — Z888 Allergy status to other drugs, medicaments and biological substances status: Secondary | ICD-10-CM | POA: Diagnosis not present

## 2021-01-10 DIAGNOSIS — M1711 Unilateral primary osteoarthritis, right knee: Secondary | ICD-10-CM | POA: Diagnosis not present

## 2021-01-10 DIAGNOSIS — R600 Localized edema: Secondary | ICD-10-CM | POA: Diagnosis not present

## 2021-01-10 DIAGNOSIS — S83231A Complex tear of medial meniscus, current injury, right knee, initial encounter: Secondary | ICD-10-CM | POA: Diagnosis not present

## 2021-01-10 DIAGNOSIS — Z79899 Other long term (current) drug therapy: Secondary | ICD-10-CM | POA: Diagnosis not present

## 2021-01-10 DIAGNOSIS — Z882 Allergy status to sulfonamides status: Secondary | ICD-10-CM | POA: Insufficient documentation

## 2021-01-10 DIAGNOSIS — Z7901 Long term (current) use of anticoagulants: Secondary | ICD-10-CM | POA: Diagnosis not present

## 2021-01-10 HISTORY — PX: PARTIAL KNEE ARTHROPLASTY: SHX2174

## 2021-01-10 LAB — TYPE AND SCREEN
ABO/RH(D): O POS
Antibody Screen: NEGATIVE

## 2021-01-10 IMAGING — DX DG KNEE 1-2V PORT*R*
2 series · 2 of 2 positions shown · non-contrast
Comparison: Right knee radiograph [DATE].

CLINICAL DATA: Status post right partial knee replacement

EXAM:
PORTABLE RIGHT KNEE - 1-2 VIEW

[knee ap]
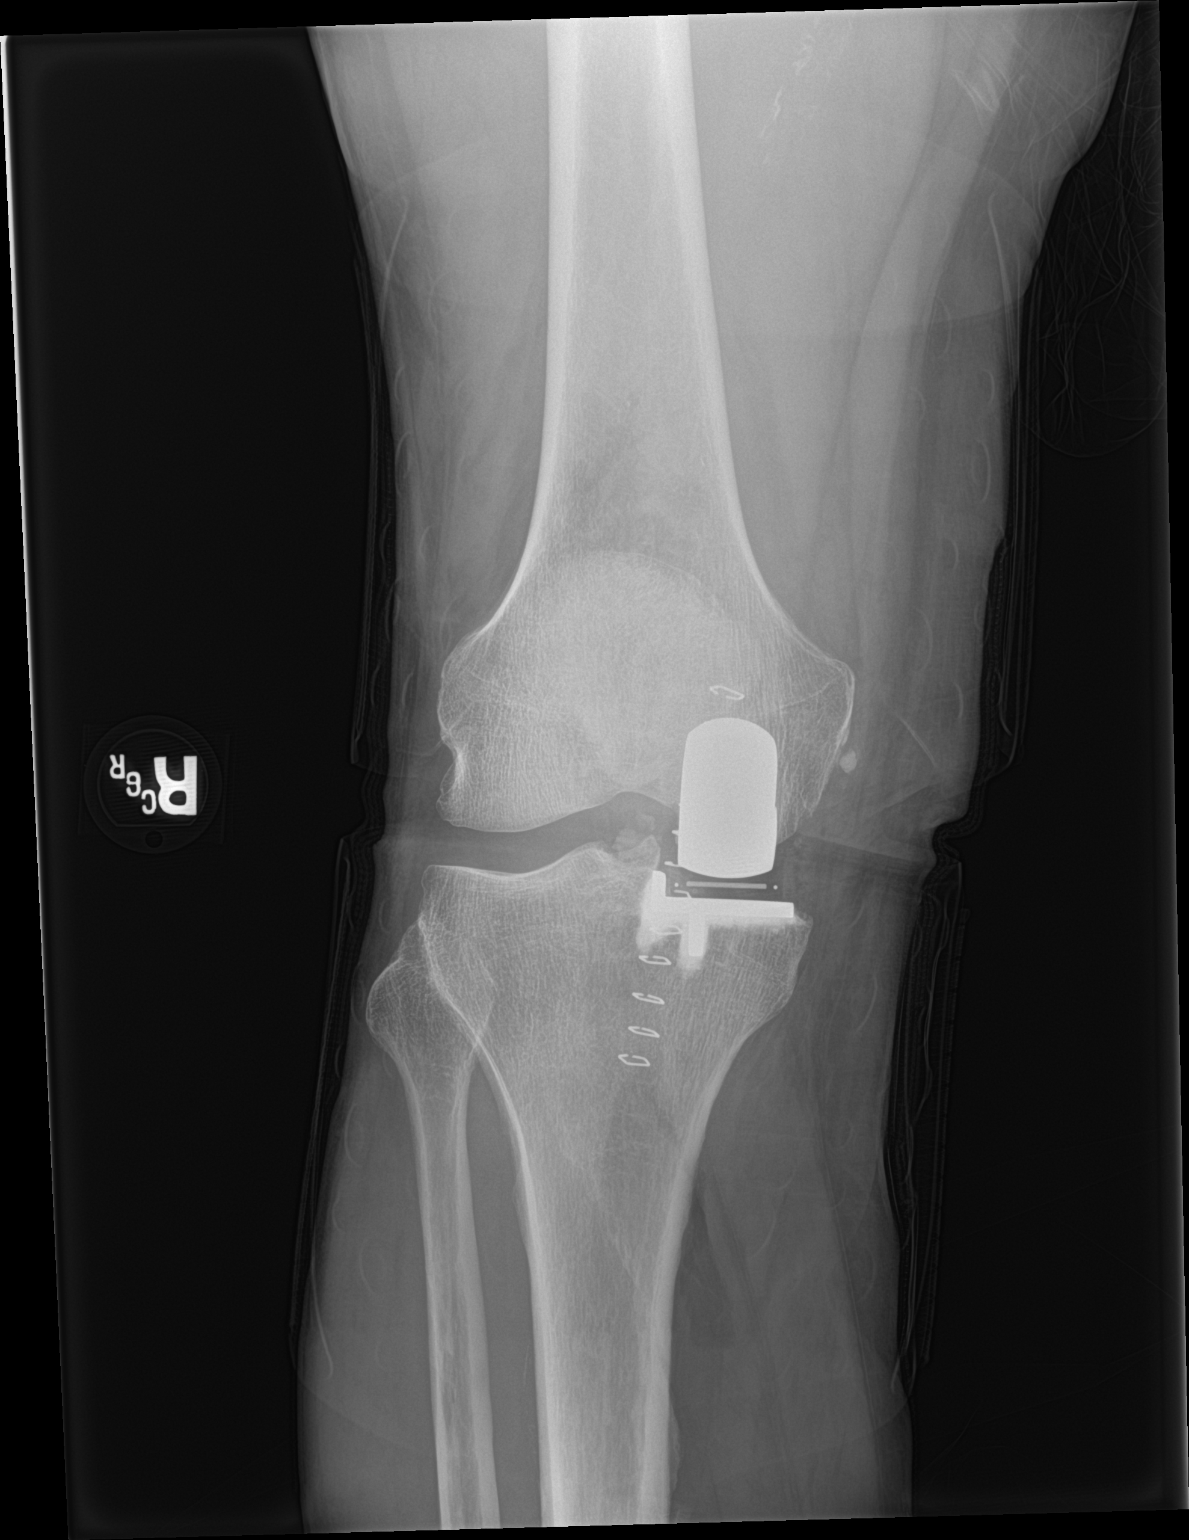

[knee lat]
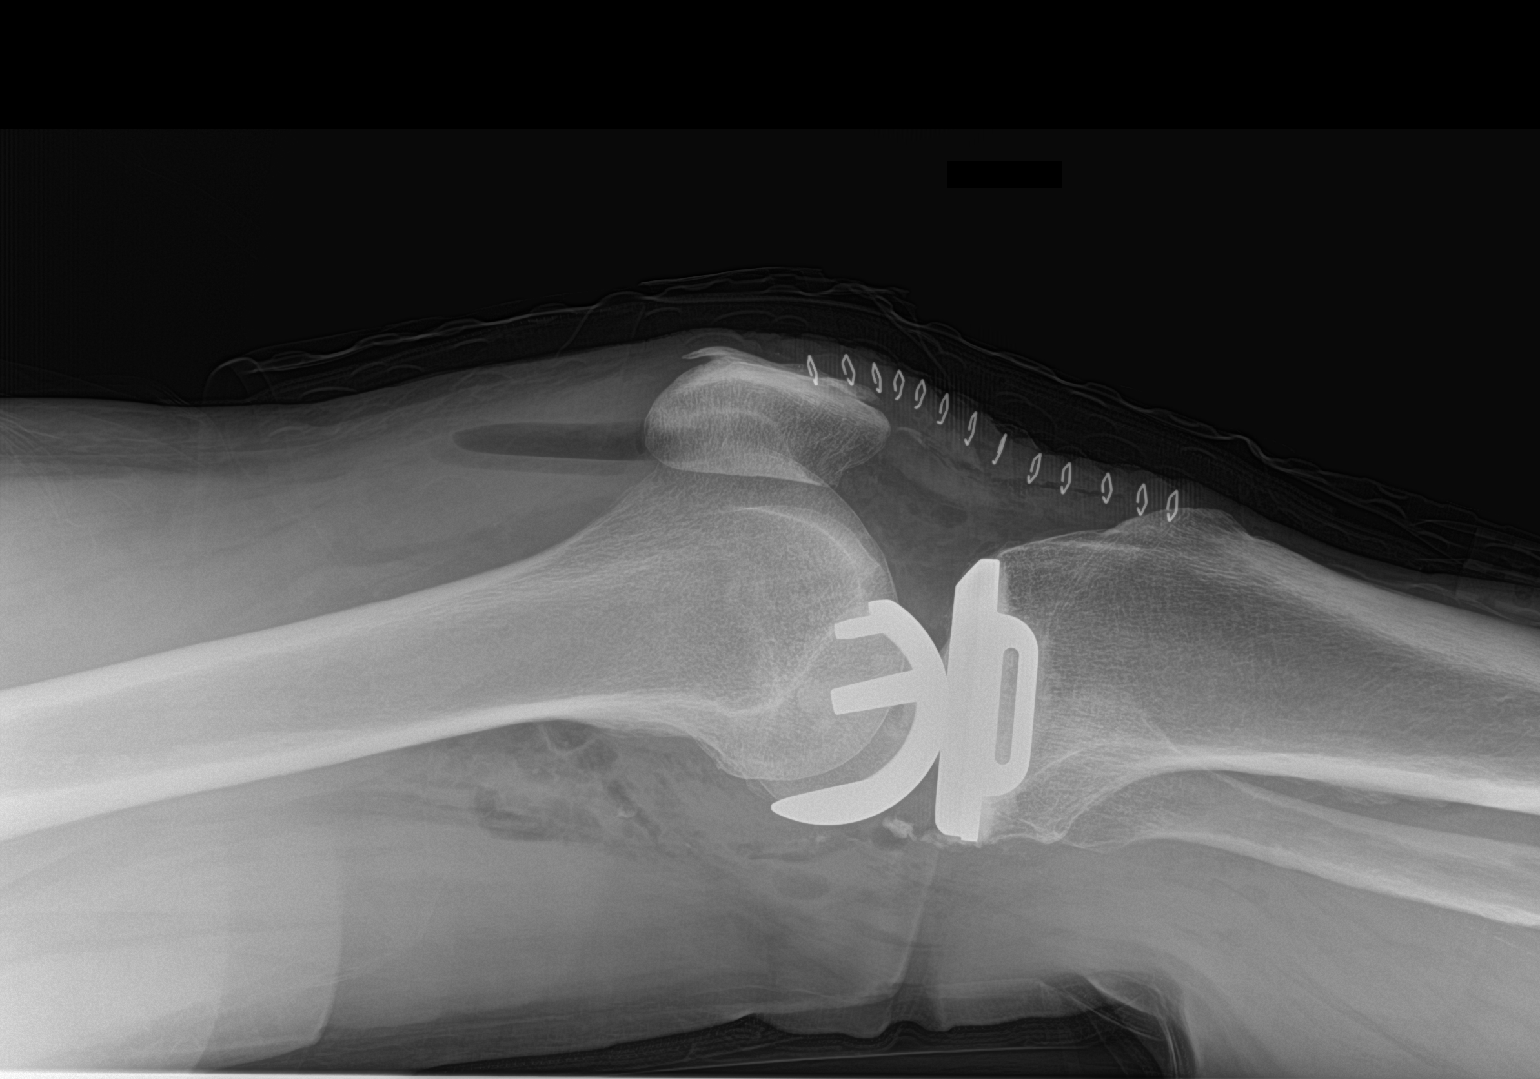

[2 of 2 positions shown; findings below may reference images not displayed]

FINDINGS: Postsurgical changes of partial right knee replacement with
subcutaneous gas, edema and cutaneous skin staples. No evidence of
acute hardware complication. Tiny osseous fragments about the knee
joint. Patellar enthesophytes.
IMPRESSION: Postoperative appearance of partial right knee replacement without
evidence of acute hardware complication.

## 2021-01-10 SURGERY — ARTHROPLASTY, KNEE, UNICOMPARTMENTAL
Anesthesia: Spinal | Site: Knee | Laterality: Right

## 2021-01-10 MED ORDER — PHENYLEPHRINE HCL (PRESSORS) 10 MG/ML IV SOLN
INTRAVENOUS | Status: DC | PRN
  Administered 2021-01-10 (×2): 100 ug via INTRAVENOUS

## 2021-01-10 MED ORDER — BUPIVACAINE-EPINEPHRINE (PF) 0.5% -1:200000 IJ SOLN
INTRAMUSCULAR | Status: DC | PRN
  Administered 2021-01-10: 30 mL via PERINEURAL

## 2021-01-10 MED ORDER — EPINEPHRINE PF 1 MG/ML IJ SOLN
INTRAMUSCULAR | Status: AC
  Filled 2021-01-10: qty 1

## 2021-01-10 MED ORDER — BUPIVACAINE HCL (PF) 0.5 % IJ SOLN
INTRAMUSCULAR | Status: AC
  Filled 2021-01-10: qty 30

## 2021-01-10 MED ORDER — FENTANYL CITRATE (PF) 100 MCG/2ML IJ SOLN
INTRAMUSCULAR | Status: DC | PRN
  Administered 2021-01-10: 25 ug via INTRAVENOUS
  Administered 2021-01-10: 50 ug via INTRAVENOUS
  Administered 2021-01-10: 25 ug via INTRAVENOUS

## 2021-01-10 MED ORDER — ROCURONIUM BROMIDE 100 MG/10ML IV SOLN
INTRAVENOUS | Status: DC | PRN
  Administered 2021-01-10: 50 mg via INTRAVENOUS
  Administered 2021-01-10: 20 mg via INTRAVENOUS

## 2021-01-10 MED ORDER — ACETAMINOPHEN 10 MG/ML IV SOLN
INTRAVENOUS | Status: DC | PRN
  Administered 2021-01-10: 1000 mg via INTRAVENOUS

## 2021-01-10 MED ORDER — ACETAMINOPHEN 500 MG PO TABS
ORAL_TABLET | ORAL | Status: AC
  Filled 2021-01-10: qty 2

## 2021-01-10 MED ORDER — PHENYLEPHRINE HCL (PRESSORS) 10 MG/ML IV SOLN
INTRAVENOUS | Status: AC
  Filled 2021-01-10: qty 1

## 2021-01-10 MED ORDER — SUGAMMADEX SODIUM 200 MG/2ML IV SOLN
INTRAVENOUS | Status: DC | PRN
  Administered 2021-01-10: 170 mg via INTRAVENOUS

## 2021-01-10 MED ORDER — VANCOMYCIN HCL IN DEXTROSE 1-5 GM/200ML-% IV SOLN
INTRAVENOUS | Status: AC
  Administered 2021-01-10: 1000 mg via INTRAVENOUS
  Filled 2021-01-10: qty 200

## 2021-01-10 MED ORDER — BUPIVACAINE LIPOSOME 1.3 % IJ SUSP
INTRAMUSCULAR | Status: DC | PRN
  Administered 2021-01-10: 20 mL

## 2021-01-10 MED ORDER — LIDOCAINE HCL (PF) 2 % IJ SOLN
INTRAMUSCULAR | Status: AC
  Filled 2021-01-10: qty 5

## 2021-01-10 MED ORDER — DEXAMETHASONE SODIUM PHOSPHATE 10 MG/ML IJ SOLN
INTRAMUSCULAR | Status: AC
  Filled 2021-01-10: qty 1

## 2021-01-10 MED ORDER — SODIUM CHLORIDE 0.9 % IV SOLN: INTRAVENOUS | Status: DC

## 2021-01-10 MED ORDER — KETOROLAC TROMETHAMINE 30 MG/ML IJ SOLN
INTRAMUSCULAR | Status: DC | PRN
  Administered 2021-01-10: 15 mg via INTRAVENOUS

## 2021-01-10 MED ORDER — DEXAMETHASONE SODIUM PHOSPHATE 10 MG/ML IJ SOLN
INTRAMUSCULAR | Status: DC | PRN
  Administered 2021-01-10: 5 mg via INTRAVENOUS

## 2021-01-10 MED ORDER — TRANEXAMIC ACID 1000 MG/10ML IV SOLN
INTRAVENOUS | Status: AC
  Filled 2021-01-10: qty 10

## 2021-01-10 MED ORDER — FENTANYL CITRATE (PF) 100 MCG/2ML IJ SOLN
INTRAMUSCULAR | Status: AC
  Filled 2021-01-10: qty 2

## 2021-01-10 MED ORDER — ONDANSETRON HCL 4 MG/2ML IJ SOLN: 4.0000 mg | Freq: Once | INTRAMUSCULAR | Status: DC | PRN

## 2021-01-10 MED ORDER — METOCLOPRAMIDE HCL 10 MG PO TABS: 5.0000 mg | ORAL_TABLET | Freq: Three times a day (TID) | ORAL | Status: DC | PRN

## 2021-01-10 MED ORDER — FENTANYL CITRATE (PF) 100 MCG/2ML IJ SOLN
25.0000 ug | INTRAMUSCULAR | Status: DC | PRN
  Administered 2021-01-10 (×4): 25 ug via INTRAVENOUS

## 2021-01-10 MED ORDER — ACETAMINOPHEN 500 MG PO TABS
1000.0000 mg | ORAL_TABLET | Freq: Four times a day (QID) | ORAL | Status: DC
  Administered 2021-01-10: 1000 mg via ORAL

## 2021-01-10 MED ORDER — SODIUM CHLORIDE 0.9 % BOLUS PEDS
250.0000 mL | Freq: Once | INTRAVENOUS | Status: AC
  Administered 2021-01-10: 250 mL via INTRAVENOUS

## 2021-01-10 MED ORDER — CHLORHEXIDINE GLUCONATE 0.12 % MT SOLN: 15.0000 mL | Freq: Once | OROMUCOSAL | Status: AC

## 2021-01-10 MED ORDER — OXYCODONE HCL 5 MG PO TABS
ORAL_TABLET | ORAL | Status: AC
  Filled 2021-01-10: qty 1

## 2021-01-10 MED ORDER — SODIUM CHLORIDE 0.9 % BOLUS PEDS: 250.0000 mL | Freq: Once | INTRAVENOUS | Status: DC

## 2021-01-10 MED ORDER — PHENYLEPHRINE HCL-NACL 10-0.9 MG/250ML-% IV SOLN
INTRAVENOUS | Status: DC | PRN
  Administered 2021-01-10: 15 ug/min via INTRAVENOUS

## 2021-01-10 MED ORDER — ONDANSETRON HCL 4 MG/2ML IJ SOLN: 4.0000 mg | Freq: Four times a day (QID) | INTRAMUSCULAR | Status: DC | PRN

## 2021-01-10 MED ORDER — TRANEXAMIC ACID 1000 MG/10ML IV SOLN
INTRAVENOUS | Status: DC | PRN
  Administered 2021-01-10: 1000 mg via TOPICAL

## 2021-01-10 MED ORDER — ONDANSETRON HCL 4 MG/2ML IJ SOLN
INTRAMUSCULAR | Status: DC | PRN
  Administered 2021-01-10: 4 mg via INTRAVENOUS

## 2021-01-10 MED ORDER — BUPIVACAINE LIPOSOME 1.3 % IJ SUSP
INTRAMUSCULAR | Status: AC
  Filled 2021-01-10: qty 20

## 2021-01-10 MED ORDER — CHLORHEXIDINE GLUCONATE 0.12 % MT SOLN
OROMUCOSAL | Status: AC
  Administered 2021-01-10: 15 mL via OROMUCOSAL
  Filled 2021-01-10: qty 15

## 2021-01-10 MED ORDER — LACTATED RINGERS IV SOLN: INTRAVENOUS | Status: DC

## 2021-01-10 MED ORDER — PROPOFOL 10 MG/ML IV BOLUS
INTRAVENOUS | Status: DC | PRN
  Administered 2021-01-10: 100 mg via INTRAVENOUS

## 2021-01-10 MED ORDER — ROCURONIUM BROMIDE 10 MG/ML (PF) SYRINGE
PREFILLED_SYRINGE | INTRAVENOUS | Status: AC
  Filled 2021-01-10: qty 10

## 2021-01-10 MED ORDER — ONDANSETRON HCL 4 MG/2ML IJ SOLN
INTRAMUSCULAR | Status: AC
  Filled 2021-01-10: qty 2

## 2021-01-10 MED ORDER — VANCOMYCIN HCL IN DEXTROSE 1-5 GM/200ML-% IV SOLN: 1000.0000 mg | INTRAVENOUS | Status: AC

## 2021-01-10 MED ORDER — ORAL CARE MOUTH RINSE: 15.0000 mL | Freq: Once | OROMUCOSAL | Status: AC

## 2021-01-10 MED ORDER — LIDOCAINE HCL (CARDIAC) PF 100 MG/5ML IV SOSY
PREFILLED_SYRINGE | INTRAVENOUS | Status: DC | PRN
  Administered 2021-01-10: 100 mg via INTRAVENOUS

## 2021-01-10 MED ORDER — PROPOFOL 500 MG/50ML IV EMUL
INTRAVENOUS | Status: AC
  Filled 2021-01-10: qty 50

## 2021-01-10 MED ORDER — OXYCODONE HCL 5 MG PO TABS
5.0000 mg | ORAL_TABLET | ORAL | Status: DC | PRN
  Administered 2021-01-10: 5 mg via ORAL

## 2021-01-10 MED ORDER — ONDANSETRON HCL 4 MG PO TABS: 4.0000 mg | ORAL_TABLET | Freq: Four times a day (QID) | ORAL | Status: DC | PRN

## 2021-01-10 MED ORDER — ACETAMINOPHEN 10 MG/ML IV SOLN
INTRAVENOUS | Status: AC
  Filled 2021-01-10: qty 100

## 2021-01-10 MED ORDER — METOCLOPRAMIDE HCL 5 MG/ML IJ SOLN: 5.0000 mg | Freq: Three times a day (TID) | INTRAMUSCULAR | Status: DC | PRN

## 2021-01-10 SURGICAL SUPPLY — 69 items
APL PRP STRL LF DISP 70% ISPRP (MISCELLANEOUS) ×2
BEARING MENISCAL TIBIAL 4 MD R (Orthopedic Implant) ×2 IMPLANT
BNDG ELASTIC 6X5.8 VLCR STR LF (GAUZE/BANDAGES/DRESSINGS) ×2 IMPLANT
BRNG TIB MED 4 PHS 3 RT MEN (Orthopedic Implant) ×1 IMPLANT
CANISTER SUCT 1200ML W/VALVE (MISCELLANEOUS) ×2 IMPLANT
CANISTER SUCT 3000ML PPV (MISCELLANEOUS) ×2 IMPLANT
CEMENT BONE R 1X40 (Cement) ×2 IMPLANT
CEMENT VACUUM MIXING SYSTEM (MISCELLANEOUS) ×2 IMPLANT
CHLORAPREP W/TINT 26 (MISCELLANEOUS) ×4 IMPLANT
COOLER POLAR GLACIER W/PUMP (MISCELLANEOUS) ×2 IMPLANT
COVER MAYO STAND REUSABLE (DRAPES) ×2 IMPLANT
COVER WAND RF STERILE (DRAPES) ×2 IMPLANT
CUFF TOURN SGL QUICK 24 (TOURNIQUET CUFF)
CUFF TOURN SGL QUICK 30 (TOURNIQUET CUFF)
CUFF TOURN SGL QUICK 34 (TOURNIQUET CUFF)
CUFF TRNQT CYL 24X4X16.5-23 (TOURNIQUET CUFF) IMPLANT
CUFF TRNQT CYL 30X4X21-28X (TOURNIQUET CUFF) IMPLANT
CUFF TRNQT CYL 34X4.125X (TOURNIQUET CUFF) IMPLANT
DRAPE C-ARM XRAY 36X54 (DRAPES) IMPLANT
DRSG MEPILEX SACRM 8.7X9.8 (GAUZE/BANDAGES/DRESSINGS) ×2 IMPLANT
DRSG OPSITE POSTOP 4X12 (GAUZE/BANDAGES/DRESSINGS) ×2 IMPLANT
DRSG OPSITE POSTOP 4X6 (GAUZE/BANDAGES/DRESSINGS) ×2 IMPLANT
ELECT CAUTERY BLADE 6.4 (BLADE) ×2 IMPLANT
ELECT REM PT RETURN 9FT ADLT (ELECTROSURGICAL) ×2
ELECTRODE REM PT RTRN 9FT ADLT (ELECTROSURGICAL) ×1 IMPLANT
GAUZE 4X4 16PLY RFD (DISPOSABLE) ×2 IMPLANT
GAUZE SPONGE 4X4 12PLY STRL (GAUZE/BANDAGES/DRESSINGS) ×2 IMPLANT
GAUZE XEROFORM 1X8 LF (GAUZE/BANDAGES/DRESSINGS) ×2 IMPLANT
GLOVE INDICATOR 8.0 STRL GRN (GLOVE) ×2 IMPLANT
GLOVE SRG 8 PF TXTR STRL LF DI (GLOVE) ×1 IMPLANT
GLOVE SURG ENC MOIS LTX SZ7.5 (GLOVE) ×8 IMPLANT
GLOVE SURG ENC MOIS LTX SZ8 (GLOVE) ×8 IMPLANT
GLOVE SURG UNDER POLY LF SZ8 (GLOVE) ×2
GOWN STRL REUS W/ TWL LRG LVL3 (GOWN DISPOSABLE) ×1 IMPLANT
GOWN STRL REUS W/ TWL XL LVL3 (GOWN DISPOSABLE) ×1 IMPLANT
GOWN STRL REUS W/TWL LRG LVL3 (GOWN DISPOSABLE) ×2
GOWN STRL REUS W/TWL XL LVL3 (GOWN DISPOSABLE) ×2
HOOD PEEL AWAY FLYTE STAYCOOL (MISCELLANEOUS) ×6 IMPLANT
KIT TURNOVER KIT A (KITS) ×2 IMPLANT
KNEE UNICOMP MEDIAL OXFORD RT (Joint) ×2 IMPLANT
MANIFOLD NEPTUNE II (INSTRUMENTS) ×2 IMPLANT
MAT ABSORB  FLUID 56X50 GRAY (MISCELLANEOUS) ×1
MAT ABSORB FLUID 56X50 GRAY (MISCELLANEOUS) ×1 IMPLANT
NDL SAFETY ECLIPSE 18X1.5 (NEEDLE) ×1 IMPLANT
NEEDLE HYPO 18GX1.5 SHARP (NEEDLE) ×2
NEEDLE SPNL 20GX3.5 QUINCKE YW (NEEDLE) ×2 IMPLANT
NS IRRIG 1000ML POUR BTL (IV SOLUTION) ×2 IMPLANT
PACK BLADE SAW RECIP 70 3 PT (BLADE) ×2 IMPLANT
PACK TOTAL KNEE (MISCELLANEOUS) ×2 IMPLANT
PAD ABD DERMACEA PRESS 5X9 (GAUZE/BANDAGES/DRESSINGS) ×4 IMPLANT
PAD WRAPON POLAR KNEE (MISCELLANEOUS) ×1 IMPLANT
PEG TWIN FEM CEMENTED MED (Knees) ×2 IMPLANT
PULSAVAC PLUS IRRIG FAN TIP (DISPOSABLE) ×2
SOL .9 NS 3000ML IRR  AL (IV SOLUTION) ×1
SOL .9 NS 3000ML IRR AL (IV SOLUTION) ×1
SOL .9 NS 3000ML IRR UROMATIC (IV SOLUTION) ×1 IMPLANT
STAPLER SKIN PROX 35W (STAPLE) ×2 IMPLANT
STRAP SAFETY 5IN WIDE (MISCELLANEOUS) ×2 IMPLANT
SUCTION FRAZIER HANDLE 10FR (MISCELLANEOUS) ×1
SUCTION TUBE FRAZIER 10FR DISP (MISCELLANEOUS) ×1 IMPLANT
SUT VIC AB 0 CT1 36 (SUTURE) ×2 IMPLANT
SUT VIC AB 2-0 CT1 (SUTURE) ×2 IMPLANT
SUT VIC AB 2-0 CT1 27 (SUTURE) ×8
SUT VIC AB 2-0 CT1 TAPERPNT 27 (SUTURE) ×4 IMPLANT
SYR 10ML LL (SYRINGE) ×2 IMPLANT
SYR 30ML LL (SYRINGE) ×4 IMPLANT
TAPE TRANSPORE STRL 2 31045 (GAUZE/BANDAGES/DRESSINGS) ×2 IMPLANT
TIP FAN IRRIG PULSAVAC PLUS (DISPOSABLE) ×1 IMPLANT
WRAPON POLAR PAD KNEE (MISCELLANEOUS) ×2

## 2021-01-10 NOTE — Anesthesia Procedure Notes (Signed)
Procedure Name: Intubation Date/Time: 01/10/2021 11:14 AM Performed by: Lowry Bowl, CRNA Pre-anesthesia Checklist: Patient identified, Emergency Drugs available, Suction available and Patient being monitored Patient Re-evaluated:Patient Re-evaluated prior to induction Oxygen Delivery Method: Circle system utilized Preoxygenation: Pre-oxygenation with 100% oxygen Induction Type: IV induction and Cricoid Pressure applied Ventilation: Mask ventilation without difficulty Laryngoscope Size: McGraph and 4 Grade View: Grade I Tube type: Oral Tube size: 7.5 mm Number of attempts: 1 Airway Equipment and Method: Stylet and Video-laryngoscopy (Videoscope electively used) Placement Confirmation: ETT inserted through vocal cords under direct vision,  positive ETCO2 and breath sounds checked- equal and bilateral Secured at: 22 cm Tube secured with: Tape Dental Injury: Teeth and Oropharynx as per pre-operative assessment

## 2021-01-10 NOTE — Evaluation (Signed)
Physical Therapy Evaluation Patient Details Name: Larry Crawford MRN: [id number] DOB: 1941-08-04 Today's Date: 01/10/2021   History of Present Illness  80 y/o s/p R unilateral knee replacement 01/10/21. .  Clinical Impression  Pt did well with PT exam and subsequent gait training, exercises, etc.  He was able to perform HEP well and showed good understanding or positioning and after edu with DME.  He was able to ambulate ~150 ft and negotiate up/down steps safely and with increasing confidence and execution with increased time/cuing.  Overall pt doing well and safe to go home.  Wife present for gait/stair training, all questions answered.    Follow Up Recommendations Home health PT;Follow surgeon's recommendation for DC plan and follow-up therapies    Equipment Recommendations  3in1 (PT);Rolling walker with 5" wheels (ordered, in room and walker used during session)    Recommendations for Other Services       Precautions / Restrictions Precautions Precautions: Fall Restrictions Weight Bearing Restrictions: Yes RLE Weight Bearing: Weight bearing as tolerated      Mobility  Bed Mobility Overal bed mobility: Independent             General bed mobility comments: Pt able to get to sitting EOB w/o issue, no hesitation    Transfers Overall transfer level: Modified independent Equipment used: Rolling walker (2 wheeled)             General transfer comment: minimal cuing for sequencing and set up, no assist needed  Ambulation/Gait Ambulation/Gait assistance: Min guard Gait Distance (Feet): 150 Feet Assistive device: Rolling walker (2 wheeled)       General Gait Details: Pt was able to ambulate with good safety and confidence.  He was able to gradually increase speed/cadence safely and though he initially reported some mild buckling in the R knee intially he quickly gained good confidence and improved mechanics to arrest this.  Stairs Stairs: Yes Stairs assistance:  Supervision Stair Management: No rails;Backwards;With walker Number of Stairs: 4 General stair comments: Pt able to negotiate up/down steps w/o assist.  Showed good safetly and confidence once educated on proper technique.  Wheelchair Mobility    Modified Rankin (Stroke Patients Only)       Balance                                             Pertinent Vitals/Pain Pain Assessment: 0-10 Pain Score: 2  Pain Location: R knee, increases with activity/ROM, but well tolerated t/o    Home Living Family/patient expects to be discharged to:: Private residence Living Arrangements: Spouse/significant other Available Help at Discharge: Family;Available 24 hours/day   Home Access: Stairs to enter Entrance Stairs-Rails: None Entrance Stairs-Number of Steps: 2+2   Home Equipment:  (RW and 3-in-1 ordered and delivered to room)      Prior Function Level of Independence: Independent         Comments: Pt reports he does a lot of walking, able to stay active     Hand Dominance        Extremity/Trunk Assessment   Upper Extremity Assessment Upper Extremity Assessment: Overall WFL for tasks assessed    Lower Extremity Assessment Lower Extremity Assessment: Overall WFL for tasks assessed (expected post-op weakness, but able to do SLRs and AROM in all planes relatively well)       Communication   Communication: No difficulties  Cognition Arousal/Alertness: Awake/alert Behavior During Therapy: WFL for tasks assessed/performed Overall Cognitive Status: Within Functional Limits for tasks assessed                                        General Comments      Exercises Total Joint Exercises Ankle Circles/Pumps: AROM;10 reps Quad Sets: Strengthening;10 reps Short Arc Quad: AROM;Strengthening;10 reps Heel Slides: 10 reps;AROM (with resisted leg extensions) Hip ABduction/ADduction: Strengthening;10 reps Straight Leg Raises: AROM;10 reps Knee  Flexion: PROM;5 reps Goniometric ROM: 0-94   Assessment/Plan    PT Assessment Patient needs continued PT services  PT Problem List Decreased strength;Decreased range of motion;Decreased activity tolerance;Decreased balance;Decreased mobility;Decreased coordination;Decreased cognition;Decreased safety awareness;Decreased knowledge of use of DME;Pain       PT Treatment Interventions Gait training;DME instruction;Stair training;Functional mobility training;Therapeutic activities;Balance training;Therapeutic exercise;Patient/family education    PT Goals (Current goals can be found in the Care Plan section)  Acute Rehab PT Goals PT Goal Formulation: With patient Time For Goal Achievement: 01/24/21 Potential to Achieve Goals: Good    Frequency BID   Barriers to discharge        Co-evaluation               AM-PAC PT "6 Clicks" Mobility  Outcome Measure Help needed turning from your back to your side while in a flat bed without using bedrails?: None Help needed moving from lying on your back to sitting on the side of a flat bed without using bedrails?: None Help needed moving to and from a bed to a chair (including a wheelchair)?: None Help needed standing up from a chair using your arms (e.g., wheelchair or bedside chair)?: None Help needed to walk in hospital room?: None Help needed climbing 3-5 steps with a railing? : A Little 6 Click Score: 23    End of Session Equipment Utilized During Treatment: Gait belt Activity Tolerance: Patient tolerated treatment well Patient left: in chair;with call bell/phone within reach;with nursing/sitter in room Nurse Communication: Mobility status PT Visit Diagnosis: Muscle weakness (generalized) (M62.81);Difficulty in walking, not elsewhere classified (R26.2);Pain Pain - Right/Left: Right Pain - part of body: Knee    Time: 0076-2263 PT Time Calculation (min) (ACUTE ONLY): 59 min   Charges:   PT Evaluation $PT Eval Low Complexity: 1  Low PT Treatments $Gait Training: 8-22 mins $Therapeutic Exercise: 8-22 mins $Therapeutic Activity: 8-22 mins        Kreg Shropshire, DPT 01/10/2021, 4:41 PM

## 2021-01-10 NOTE — Care Management (Addendum)
Larry Crawford with [employer] to deliver DME to room Per Helene Kelp with [facility] they already for home health orders for discharge, and will not require orders at discharge  PT eval pending.  Please consult TOC if level of care needed at discharge changes from home health   Martell, BSN Nurse Case Management: [place] for :  [phone]

## 2021-01-10 NOTE — Anesthesia Postprocedure Evaluation (Signed)
Anesthesia Post Note  Patient: Larry Crawford  Procedure(s) Performed: UNICOMPARTMENTAL KNEE (Right Knee)  Patient location during evaluation: PACU Anesthesia Type: Spinal Level of consciousness: awake and alert, awake and oriented Pain management: pain level controlled Vital Signs Assessment: post-procedure vital signs reviewed and stable Respiratory status: spontaneous breathing, nonlabored ventilation and respiratory function stable Cardiovascular status: blood pressure returned to baseline and stable Postop Assessment: no apparent nausea or vomiting Anesthetic complications: no   No complications documented.   Last Vitals:  Vitals:   01/10/21 1415 01/10/21 1430  BP: 134/68 133/70  Pulse: 70 68  Resp: 13 15  Temp:  36.5 C  SpO2: 96% 96%    Last Pain:  Vitals:   01/10/21 1430  TempSrc:   PainSc: 3                  Phill Mutter

## 2021-01-10 NOTE — Anesthesia Preprocedure Evaluation (Addendum)
Anesthesia Evaluation  Patient identified by MRN, date of birth, ID band Patient awake    Reviewed: Allergy & Precautions, NPO status , Patient's Chart, lab work & pertinent test results  Airway Mallampati: II  TM Distance: >3 FB     Dental   Pulmonary former smoker,    Pulmonary exam normal        Cardiovascular negative cardio ROS Normal cardiovascular exam     Neuro/Psych negative neurological ROS  negative psych ROS   GI/Hepatic Neg liver ROS, IBS   Endo/Other  negative endocrine ROS  Renal/GU negative Renal ROS  negative genitourinary   Musculoskeletal  (+) Arthritis ,   Abdominal Normal abdominal exam  (+)   Peds negative pediatric ROS (+)  Hematology negative hematology ROS (+)   Anesthesia Other Findings Past Medical History: No date: BPH (benign prostatic hypertrophy) No date: Colon polyp No date: Elevated red blood cell count No date: Erectile dysfunction No date: Gout No date: History of chicken pox No date: History of measles No date: History of mumps No date: IBS (irritable bowel syndrome) 2000: MRSA infection     Comment:  nasal 2021: Skin cancer (melanoma) ([place])     Comment:  left arm  Reproductive/Obstetrics                             Anesthesia Physical Anesthesia Plan  ASA: II  Anesthesia Plan: General   Post-op Pain Management:    Induction: Intravenous  PONV Risk Score and Plan:   Airway Management Planned: Oral ETT  Additional Equipment:   Intra-op Plan:   Post-operative Plan: Extubation in OR  Informed Consent: I have reviewed the patients History and Physical, chart, labs and discussed the procedure including the risks, benefits and alternatives for the proposed anesthesia with the patient or authorized representative who has indicated his/her understanding and acceptance.     Dental advisory given  Plan Discussed with: CRNA and  Surgeon  Anesthesia Plan Comments: (Patient categorically refused spinal... Will plan for a GOT.)       Anesthesia Quick Evaluation

## 2021-01-10 NOTE — Transfer of Care (Signed)
Immediate Anesthesia Transfer of Care Note  Patient: Larry Crawford  Procedure(s) Performed: UNICOMPARTMENTAL KNEE (Right Knee)  Patient Location: PACU  Anesthesia Type:General  Level of Consciousness: awake, drowsy and patient cooperative  Airway & Oxygen Therapy: Patient Spontanous Breathing and Patient connected to face mask oxygen  Post-op Assessment: Report given to RN and Post -op Vital signs reviewed and stable  Post vital signs: Reviewed and stable  Last Vitals:  Vitals Value Taken Time  BP    Temp    Pulse 85 01/10/21 1329  Resp 20 01/10/21 1329  SpO2 98 % 01/10/21 1329  Vitals shown include unvalidated device data.  Last Pain:  Vitals:   01/10/21 0933  TempSrc: Tympanic  PainSc: 0-No pain     431/42    Complications: No complications documented.

## 2021-01-10 NOTE — H&P (Signed)
History of Present Illness: Larry Crawford is a 80 y.o.male who presents today for his surgical history and physical for upcoming right partial knee arthroplasty. The patient scheduled for a right medial unicondylar knee arthroplasty with Dr. Roland Rack on 01/10/2021. The patient denies any changes in his medical history since he was last evaluated. The patient denies any trauma or injury since he was last seen. The patient continues to report moderate aching discomfort along the medial aspect of the right knee. Pain score today is a 3 out of 10. He denies any personal history of heart attack, stroke, asthma or COPD. No personal history of blood clot.  Current Outpatient Medications: . allopurinoL (ZYLOPRIM) 300 MG tablet Take 300 mg by mouth once daily  . arginine HCl, L-arginine, 1,000 mg Tab Take by mouth Take 1,000 mg by mouth daily.  . cetirizine (ZYRTEC) 10 MG tablet Take 10 mg by mouth once daily  . cholecalciferol, vitamin D3, (VITAMIN D3) 125 mcg (5,000 unit) tablet Take 5,000 Units by mouth once daily  . cimetidine (TAGAMET) 200 MG tablet Take by mouth Take 200 mg by mouth daily as needed (heartburn).  Marland Kitchen co-enzyme Q-10, ubiquinone, 100 mg capsule Take 100 mg by mouth once daily  . COLLAGEN MISC Take 1 capsule by mouth once daily  . cyanocobalamin (VITAMIN B12) 1000 MCG tablet Take by mouth Take 1,000 mcg by mouth daily.  . famotidine (PEPCID) 20 MG tablet Take by mouth Take 20 mg by mouth daily as needed for heartburn or indigestion.  . fluocinolone acetonide (DERMOTIC OIL) 0.01 % otic drop Apply topically Apply 1 application topically See admin instructions. 1 application in the ears once or twice weekly  . fluticasone propionate (FLONASE) 50 mcg/actuation nasal spray Place into one nostril Place 1 spray into both nostrils as needed for allergies or rhinitis.  Marland Kitchen ibuprofen (MOTRIN) 200 MG tablet Take by mouth Take 200 mg by mouth every 6 (six) hours as needed for moderate pain.  Marland Kitchen lifitegrast  (XIIDRA) 5 % ophthalmic solution Apply 1 drop to eye 2 (two) times daily  . lutein 20 mg Cap Take 1 capsule by mouth once daily  . maca extract 500 mg Cap Take 1 capsule by mouth as needed  . multivitamin tablet Take 1 tablet by mouth once daily  . niacin 500 MG tablet Take by mouth Take 500 mg by mouth at bedtime.  . propylene glycol (SYSTANE BALANCE OPHTH) Apply 1 drop to eye as needed  . tadalafil (CIALIS) 5 MG tablet Take 5 mg by mouth every morning.  Marland Kitchen UNABLE TO FIND Niagen 300mg  1 capsule daily  . UNABLE TO FIND 7- Keto Lean 1 capsule on Monday, Wednesday and Friday  . UNABLE TO FIND Pygeum Prostate Heath 1 capsule daily  . vit A/vit C/vit E/zinc/copper (PRESERVISION AREDS ORAL) Take 1 capsule by mouth 2 (two) times daily  . VITAMIN B COMPLEX ORAL Take 1 capsule by mouth once daily  . acetylcysteine 600 mg capsule Take 600 mg by mouth once (Patient not taking: Reported on 01/04/2021 )  . allopurinol (ZYLOPRIM) 300 MG tablet Take 300 mg by mouth nightly. (Patient not taking: Reported on 11/28/2020 )  . [START ON 01/09/2021] apixaban (ELIQUIS) 2.5 mg tablet Take 1 tablet (2.5 mg total) by mouth 2 (two) times daily 28 tablet 0  . arginine 500 mg tablet Take 2 tablets by mouth once daily (Patient not taking: Reported on 01/04/2021 )  . ascorbic acid (VITAMIN C) 1000 MG tablet Take 1,500 mg  by mouth 2 (two) times daily. (Patient not taking: Reported on 11/28/2020 )  . b complex vitamins (VITAMIN B COMPLEX) tablet Take 1 tablet by mouth once daily. (Patient not taking: Reported on 11/28/2020 )  . cholecalciferol (VITAMIN D3) 2,000 unit capsule Take 2,000 Units by mouth once daily. (Patient not taking: Reported on 11/28/2020 )  . co-enzyme Q-10, ubiquinone, (CO Q-10) 100 mg capsule Take 200 mg by mouth once daily. (Patient not taking: Reported on 11/28/2020 )  . cyanocobalamin, vitamin B-12, (VITAMIN B-12 ORAL) Take 250 mcg by mouth once daily (Patient not taking: Reported on 01/04/2021 )  . diclofenac  (SOLARAZE) 3 % topical gel Apply 1 Application topically 2 (two) times daily. To head, neck, nose (Patient not taking: Reported on 11/28/2020 )  . fluticasone (FLONASE) 50 mcg/actuation nasal spray Place 2 sprays into both nostrils every morning. USE AM OF SURGERY (Patient not taking: Reported on 11/28/2020 )  . folic acid (FOLVITE) 353 MCG tablet Take 400 mcg by mouth once daily. (Patient not taking: Reported on 11/28/2020 )  . KELP ORAL Take by mouth. (Patient not taking: Reported on 11/28/2020 )  . KELP ORAL Take 225 mcg by mouth Takes 2-3 times a week (Patient not taking: Reported on 01/04/2021 )  . lutein 20 mg Cap Take by mouth. (Patient not taking: Reported on 11/28/2020 )  . lycopene 10 mg Cap Take 20 mg by mouth once daily. (Patient not taking: Reported on 11/28/2020 )  . niacin 500 MG tablet Take 500 mg by mouth daily with breakfast. (Patient not taking: Reported on 11/28/2020 )  . [START ON 01/09/2021] oxyCODONE (ROXICODONE) 5 MG immediate release tablet Take 1-2 tablets (5-10 mg total) by mouth every 4 (four) hours as needed for Pain for up to 5 days 50 tablet 0  . vitamin E 200 UNIT capsule Take 200 Units by mouth 2 (two) times daily. (Patient not taking: Reported on 11/28/2020 )  . whey protein isolate, bulk, 100 % Powd Use Take 4g by mouth once a week (Patient not taking: Reported on 01/04/2021 )   Allergies:  . Sulfa (Sulfonamide Antibiotics) Shortness Of Breath, Palpitations and Rash  . Tetracyclines Shortness Of Breath and Palpitations  . Shellfish Derived Other (Increases gout symptoms)  . Statins-Hmg-Coa Reductase Inhibitors Muscle Pain   Past Medical History:  . Anesthesia complication (Spinal did not take w/ 1 hernia repair)  . BPH (benign prostatic hypertrophy)  . Depression  . ED (erectile dysfunction)  . Gout  . History of elevated PSA  . History of MRSA infection ~2000 (nasal)  . Hypogonadism male   Past Surgical History:  Procedure Laterality Date  . BUNION CORRECTION  Right  great toe  . CATARACT EXTRACTION Bilateral  . CHOLECYSTECTOMY  . CHOLECYSTECTOMY  . FUNCTIONAL ENDOSCOPIC SINUS SURGERY  . HERNIA REPAIR Bilateral  inguinal (right x2 & left x1)  . INSERTION PENILE PROSTHESIS MULTICOMPONENT N/A 12/17/2013  Procedure: Insertion of a three-piece penile implant ; Surgeon: Hillery Hunter, MD; Location:  [facility]; Service: Urology; Laterality: N/A;  . SHOULDER SURGERY Left  X2  . SKIN BIOPSY  basal cell skin Ca removed  . TONSILLECTOMY   Past Medical History:  No pertinent family history.   Social History:   Socioeconomic History:  Marland Kitchen Marital status: Married  Spouse name: Not on file  . Number of children: Not on file  . Years of education: Not on file  . Highest education level: Not on file  Occupational History  . Not  on file  Tobacco Use  . Smoking status: Former Smoker  Packs/day: 0.50  Years: 2.00  Pack years: 1.00  Quit date: 11/19/1966  Years since quitting: 54.1  . Smokeless tobacco: Never Used  Substance and Sexual Activity  . Alcohol use: Yes  Alcohol/week: 0.0 standard drinks  Comment: ~ 2 drinks/month  . Drug use: No  . Sexual activity: Not on file  Other Topics Concern  . Not on file  Social History Narrative  . Not on file   Social Determinants of Health:   Financial Resource Strain: Not on file  Food Insecurity: Not on file  Transportation Needs: Not on file   Review of Systems:  A comprehensive 14 point ROS was performed, reviewed, and the pertinent orthopaedic findings are documented in the HPI.  Physical Exam: Vitals:  01/04/21 1522  BP: 124/72  Weight: 91.7 kg (202 lb 3.2 oz)  Height: 170.2 cm (5\' 7" )  PainSc: 2  PainLoc: Knee   General/Constitutional: The patient appears to be well-nourished, well-developed, and in no acute distress. Neuro/Psych: Normal mood and affect, oriented to person, place and time. Eyes: Non-icteric. Pupils are equal, round, and reactive to light, and exhibit synchronous  movement. Lymphatic: No palpable adenopathy. Respiratory: Lungs clear to auscultation, Normal chest excursion, No wheezes and Non-labored breathing Cardiovascular: Regular rate and rhythm. No murmurs. and No edema, swelling or tenderness, except as noted in detailed exam. Vascular: No edema, swelling or tenderness, except as noted in detailed exam. Integumentary: No impressive skin lesions present, except as noted in detailed exam. Musculoskeletal: Unremarkable, except as noted in detailed exam.  Right knee exam: GAIT: Essentially normal gait and uses no assistive devices. ALIGNMENT: mild varus SKIN: unremarkable SWELLING: minimal EFFUSION: trace WARMTH: no warmth TENDERNESS: mild over the medial joint line, no lateral joint line or peripatellar tenderness ROM: full without pain McMURRAY'S: negative PATELLOFEMORAL: normal tracking with no peri-patellar tenderness and negative apprehension sign CREPITUS: no LACHMAN'S: negative PIVOT SHIFT: negative ANTERIOR DRAWER: negative POSTERIOR DRAWER: negative VARUS/VALGUS: stable  He is neurovascularly intact to the right lower extremity and foot.  Knee Imaging, external: Right knee: A recent MRI scan of the right knee has been performed and is available for review. By report, the scan demonstrates evidence of a "complex tear of the posterior horn near the root with radial and longitudinal components. 1.8 cm parameniscal cyst extending from posterior horn centrally behind the PCL. Degenerated body and posterior root." The MRI scan also demonstrates evidence of moderate degenerative changes of the medial compartment, as well as more focal mild changes of the lateral and patellofemoral compartments. There also is evidence of some degenerative signal within the MCL without compromise of the ligament. No anterior posterior cruciate ligament pathology is noted. These images were reviewed at today's appointment.  Assessment:  Primary osteoarthritis  of right knee.   Plan: 1. Treatment options were discussed today with the patient. 2. The patient is scheduled for a right unicondylar knee arthroplasty with Dr. Roland Rack on 01/10/2021. 3. The patient was instructed on the risk and benefits of surgery and wishes to proceed at this time. 4. His pain medication and blood thinning medication for after surgery were sent into his pharmacy. The patient was prescribed oxycodone and Eliquis which he will pick up on Monday. 5. This document will serve as a surgical history and physical for the patient. 6. The patient will follow-up per standard postop protocol. He can call with any questions in the future.  The procedure was discussed  with the patient, as were the potential risks (including bleeding, infection, nerve and/or blood vessel injury, persistent or recurrent pain, loosening and/or failure of the components, dislocation, need for further surgery, blood clots, strokes, heart attacks and/or arhythmias, pneumonia, etc.) and benefits. The patient states his/her understanding and wishes to proceed. All of the patient's questions and concerns were answered.   H&P reviewed and patient re-examined. No changes.

## 2021-01-10 NOTE — Discharge Instructions (Addendum)
    AMBULATORY SURGERY  DISCHARGE INSTRUCTIONS   1) The drugs that you were given will stay in your system until tomorrow so for the next 24 hours you should not:  A) Drive an automobile B) Make any legal decisions C) Drink any alcoholic beverage   2) You may resume regular meals tomorrow.  Today it is better to start with liquids and gradually work up to solid foods.  You may eat anything you prefer, but it is better to start with liquids, then soup and crackers, and gradually work up to solid foods.   3) Please notify your doctor immediately if you have any unusual bleeding, trouble breathing, redness and pain at the surgery site, drainage, fever, or pain not relieved by medication.    4) Additional Instructions:        Please contact your physician with any problems or Same Day Surgery at [phone], Monday through Friday 6 am to 4 pm, or [place] at [facility] number at [phone].Orthopedic discharge instructions: May shower with intact OpSite dressing. Apply ice frequently to knee or use Polar Care. Start Eliquis 2.5 mg twice daily for 14 days on Wednesday morning, then take aspirin 325 mg daily for 4 weeks. Take oxycodone as prescribed when needed.  May supplement with ES Tylenol if necessary. May weight-bear as tolerated on right leg - use walker for balance and support. Start home physical therapy tomorrow as arranged. Follow-up in 10-14 days or as scheduled.

## 2021-01-10 NOTE — Progress Notes (Signed)
Pt discharged home. PT, OT and Care management were completed with out any difficulties. MD was notified for pt leaving, pt refused to wait for MD. Pt and spouse verbalized fully understanding of instructions. Pt was escorted on a  wheelchair with the [facility] and walker to the visitors entrance. Continue to monitor.

## 2021-01-10 NOTE — Op Note (Signed)
01/10/2021  1:06 PM  Patient:   Larry Crawford  Pre-Op Diagnosis:   Osteoarthritis of medial compartment, right knee.  Post-Op Diagnosis:   Same  Procedure:   Right unicondylar knee arthroplasty.  Surgeon:   Pascal Lux, MD  Assistant:   Cameron Proud, PA-C; Jaynie Bream, PA-S  Anesthesia:   GET  Findings:   As above.  Complications:   None  EBL:   10 cc  Fluids:   700 cc crystalloid  UOP:   None  TT:   80 minutes at 300 mmHg  Drains:   None  Closure:   Staples  Implants:   All-cemented Biomet Oxford system with a medium femoral component, a "D" sized tibial tray, and a 4 mm meniscal bearing insert.  Brief Clinical Note:   The patient is a 80 year old male with a long history of gradually worsening medial sided right knee pain. His symptoms are worsened significantly over the past year or so and have persisted despite medications, activity modification, etc. His history and examination are consistent with degenerative joint disease of the medial compartment, which was confirmed by preoperative MRI scan. This MRI scan also demonstrated a complex tear of the posterior portion of the medial meniscus, including a radial component. The patient presents at this time for a right partial knee replacement.  Procedure:   The patient was brought into the operating room and lain in the supine position. After adequate general endotracheal intubation and anesthesia was obtained, the patient was repositioned so that the non-surgical leg was placed in a flexed and abducted position in the yellow fin leg holder while the surgical extremity was placed over the Biomet leg holder. The right lower extremity was prepped with ChloraPrep solution before being draped sterilely. Preoperative antibiotics were administered. After performing a timeout to verify the appropriate surgical site, the limb was exsanguinated with an Esmarch and the tourniquet inflated to 300 mmHg.   A standard anterior  approach to the knee was made through an approximately 3.5-4 inch incision. The incision was carried down through the subcutaneous tissues to expose the superficial retinaculum. This was split the length the incision and the medial flap elevated sufficiently to expose the medial retinaculum. The medial retinaculum was incised along the medial border of the patella tendon and extended proximally along the medial border of the patella, leaving a 3-4 mm cuff of tissue. The soft tissues were elevated off the anteromedial aspect of the proximal tibia. The anterior portion of the meniscus was removed after performing a subtotal excision of the infrapatellar fat pad. The anterior cruciate ligament was inspected and found to be in excellent condition. Osteophytes were removed from the inferior pole of the patella as well as from the notch using a quarter-inch osteotome. There were significant degenerative changes of both the femur and tibia on the medial side. The medial femoral condyle was sized using the large and medium sizers. It was felt that the medium guide best optimized the contour of the femur. This was left in place and the external tibial guide positioned. The coupling device was used to connect the guide to the medial femoral condylar sizer to optimize appropriate orientation. Two guide pins were inserted into the cutting block before the coupling device and sizer were removed. The appropriate tibial cut was made using the oscillating and reciprocating saws. The piece was removed in its entirety and taken to the back table where it was sized and found to be optimally replicated by a "D"  sized component. The 9 mm spacer was inserted to verify that sufficient bone had been removed.  Attention was directed to femoral side. The intramedullary canal was accessed through a 4 mm drill hole. The intramedullary guide was positioned before the guide for the femoral condylar holes was positioned. The appropriate  coupling device connected this guide to the intramedullary guide before both drill holes were created in the distal aspect of the medial femoral condyle. The devices were removed and the posterior condylar cutting block inserted. The appropriate cut was made using the reciprocating saw and this piece removed. The #0 spigot was inserted and the initial bone milling performed. A trial femoral component was inserted and both the flexion and extension gaps measured. In flexion, the gap measured 7 mm whereas in extension, it measured 3 mm. Therefore, the #4 spigot was selected and the secondary bone milling performed. Repeat sizing demonstrated symmetric flexion and extension gaps. The bone was removed from the postero-medial and postero-lateral aspects of the femoral condyle, as well as from the beneath the collar of the spigot. Bone also was removed from the anterior portion of the femur so as to minimize any potential impingement with the meniscal bearing insert. The trial components removed and several drill holes placed into the distal femoral condyle to further augment cement fixation.  Attention was redirected to the tibial side. The "D" sized tibial tray was positioned and temporarily secured using the appropriate spiked nail. The keel was created using the bi-bladed reciprocating saw and hoe. The keeled "D" sized trial tibial tray was inserted to be sure that it seated properly. At this point, a total of 20 cc of Exparel diluted out to 60 cc with normal saline and 30 cc of 0.5% Sensorcaine was injected in and around the posterior and medial capsular tissues, as well as the peri-incisional tissues to help with postoperative pain control.  The bony surfaces were prepared for cementing by irrigating them thoroughly with bacitracin saline solution using the jet lavage system before packing them with a dry Ray-Tec sponge. Meanwhile, cement was being mixed on the back table. When the cement was ready, the tibial  tray was cemented in first. The excess cement was removed using a Surveyor, quantity after impacting it into place. Next, the femoral component was impacted into place. Again the excess cement was removed using a Surveyor, quantity. The 4 mm spacer was inserted and the knee brought into near full extension while the cement hardened. Once the cement hardened, the spacer was removed and the 4 mm meniscal bearing insert was trialed. This demonstrated excellent tracking while the knee was placed through a range of motion, and showed no evidence towards subluxation or dislocation. In addition, it did not fit too tightly. Therefore, the permanent 4 mm meniscal bearing insert was snapped into position after verifying that no cement had been retained posteriorly. Again the knee was placed through a range of motion with the findings as described above.  The wound was copiously irrigated with sterile saline solution via the jet lavage system before the retinacular layer was reapproximated using #0 Vicryl interrupted sutures. At this point, 1 g of transexemic acid in 10 cc of normal saline was injected intra-articularly. The subcutaneous tissues were closed in two layers using 2-0 Vicryl interrupted sutures before the skin was closed using staples. A sterile occlusive dressing was applied to the knee before the patient was awakened. The patient was transferred back to his/her hospital bed and returned to the recovery room  in satisfactory condition after tolerating the procedure well. A Polar Care device was applied to the knee as well.

## 2021-01-11 ENCOUNTER — Encounter: Payer: Self-pay | Admitting: Surgery

## 2021-01-11 DIAGNOSIS — N529 Male erectile dysfunction, unspecified: Secondary | ICD-10-CM | POA: Diagnosis not present

## 2021-01-11 DIAGNOSIS — F32A Depression, unspecified: Secondary | ICD-10-CM | POA: Diagnosis not present

## 2021-01-11 DIAGNOSIS — Z7901 Long term (current) use of anticoagulants: Secondary | ICD-10-CM | POA: Diagnosis not present

## 2021-01-11 DIAGNOSIS — M109 Gout, unspecified: Secondary | ICD-10-CM | POA: Diagnosis not present

## 2021-01-11 DIAGNOSIS — Z87891 Personal history of nicotine dependence: Secondary | ICD-10-CM | POA: Diagnosis not present

## 2021-01-11 DIAGNOSIS — Z96651 Presence of right artificial knee joint: Secondary | ICD-10-CM | POA: Diagnosis not present

## 2021-01-11 DIAGNOSIS — Z471 Aftercare following joint replacement surgery: Secondary | ICD-10-CM | POA: Diagnosis not present

## 2021-01-11 DIAGNOSIS — N400 Enlarged prostate without lower urinary tract symptoms: Secondary | ICD-10-CM | POA: Diagnosis not present

## 2021-01-12 NOTE — Consult Note (Signed)
Notified that pt has intermittent hiccoughs since surgery, 01/10/21.  Pt s/p knee replacement under GA (pt request for GA).  Pt underwent uneventful placement of OETT.  No other issues noted intra or postoperatively.  Called pt today.  States still with intermittent hiccoughs but improving.  Also slight sore throat.  Advised to give a tincture of time with hopeful resolution.  If problem persists advised to contact surgeon for possible ENT consult.

## 2021-01-12 NOTE — Addendum Note (Signed)
Addendum  created 01/12/21 0927 by Phill Mutter, MD   Clinical Note Signed

## 2021-01-13 DIAGNOSIS — F32A Depression, unspecified: Secondary | ICD-10-CM | POA: Diagnosis not present

## 2021-01-13 DIAGNOSIS — Z471 Aftercare following joint replacement surgery: Secondary | ICD-10-CM | POA: Diagnosis not present

## 2021-01-13 DIAGNOSIS — M109 Gout, unspecified: Secondary | ICD-10-CM | POA: Diagnosis not present

## 2021-01-13 DIAGNOSIS — Z96651 Presence of right artificial knee joint: Secondary | ICD-10-CM | POA: Diagnosis not present

## 2021-01-13 DIAGNOSIS — N529 Male erectile dysfunction, unspecified: Secondary | ICD-10-CM | POA: Diagnosis not present

## 2021-01-13 DIAGNOSIS — N400 Enlarged prostate without lower urinary tract symptoms: Secondary | ICD-10-CM | POA: Diagnosis not present

## 2021-01-16 DIAGNOSIS — N400 Enlarged prostate without lower urinary tract symptoms: Secondary | ICD-10-CM | POA: Diagnosis not present

## 2021-01-16 DIAGNOSIS — M109 Gout, unspecified: Secondary | ICD-10-CM | POA: Diagnosis not present

## 2021-01-16 DIAGNOSIS — Z96651 Presence of right artificial knee joint: Secondary | ICD-10-CM | POA: Diagnosis not present

## 2021-01-16 DIAGNOSIS — Z471 Aftercare following joint replacement surgery: Secondary | ICD-10-CM | POA: Diagnosis not present

## 2021-01-16 DIAGNOSIS — F32A Depression, unspecified: Secondary | ICD-10-CM | POA: Diagnosis not present

## 2021-01-16 DIAGNOSIS — N529 Male erectile dysfunction, unspecified: Secondary | ICD-10-CM | POA: Diagnosis not present

## 2021-01-18 DIAGNOSIS — Z96651 Presence of right artificial knee joint: Secondary | ICD-10-CM | POA: Diagnosis not present

## 2021-01-18 DIAGNOSIS — Z471 Aftercare following joint replacement surgery: Secondary | ICD-10-CM | POA: Diagnosis not present

## 2021-01-18 DIAGNOSIS — N400 Enlarged prostate without lower urinary tract symptoms: Secondary | ICD-10-CM | POA: Diagnosis not present

## 2021-01-18 DIAGNOSIS — F32A Depression, unspecified: Secondary | ICD-10-CM | POA: Diagnosis not present

## 2021-01-18 DIAGNOSIS — N529 Male erectile dysfunction, unspecified: Secondary | ICD-10-CM | POA: Diagnosis not present

## 2021-01-18 DIAGNOSIS — M109 Gout, unspecified: Secondary | ICD-10-CM | POA: Diagnosis not present

## 2021-01-20 DIAGNOSIS — N529 Male erectile dysfunction, unspecified: Secondary | ICD-10-CM | POA: Diagnosis not present

## 2021-01-20 DIAGNOSIS — Z471 Aftercare following joint replacement surgery: Secondary | ICD-10-CM | POA: Diagnosis not present

## 2021-01-20 DIAGNOSIS — N400 Enlarged prostate without lower urinary tract symptoms: Secondary | ICD-10-CM | POA: Diagnosis not present

## 2021-01-20 DIAGNOSIS — M109 Gout, unspecified: Secondary | ICD-10-CM | POA: Diagnosis not present

## 2021-01-20 DIAGNOSIS — Z96651 Presence of right artificial knee joint: Secondary | ICD-10-CM | POA: Diagnosis not present

## 2021-01-20 DIAGNOSIS — F32A Depression, unspecified: Secondary | ICD-10-CM | POA: Diagnosis not present

## 2021-01-23 DIAGNOSIS — N400 Enlarged prostate without lower urinary tract symptoms: Secondary | ICD-10-CM | POA: Diagnosis not present

## 2021-01-23 DIAGNOSIS — M109 Gout, unspecified: Secondary | ICD-10-CM | POA: Diagnosis not present

## 2021-01-23 DIAGNOSIS — N529 Male erectile dysfunction, unspecified: Secondary | ICD-10-CM | POA: Diagnosis not present

## 2021-01-23 DIAGNOSIS — Z471 Aftercare following joint replacement surgery: Secondary | ICD-10-CM | POA: Diagnosis not present

## 2021-01-23 DIAGNOSIS — Z96651 Presence of right artificial knee joint: Secondary | ICD-10-CM | POA: Diagnosis not present

## 2021-01-23 DIAGNOSIS — F32A Depression, unspecified: Secondary | ICD-10-CM | POA: Diagnosis not present

## 2021-01-25 DIAGNOSIS — M25561 Pain in right knee: Secondary | ICD-10-CM | POA: Diagnosis not present

## 2021-01-25 DIAGNOSIS — M6281 Muscle weakness (generalized): Secondary | ICD-10-CM | POA: Diagnosis not present

## 2021-01-25 DIAGNOSIS — M25661 Stiffness of right knee, not elsewhere classified: Secondary | ICD-10-CM | POA: Diagnosis not present

## 2021-01-25 DIAGNOSIS — G8929 Other chronic pain: Secondary | ICD-10-CM | POA: Diagnosis not present

## 2021-01-27 DIAGNOSIS — M25561 Pain in right knee: Secondary | ICD-10-CM | POA: Diagnosis not present

## 2021-01-27 DIAGNOSIS — G8929 Other chronic pain: Secondary | ICD-10-CM | POA: Diagnosis not present

## 2021-01-27 DIAGNOSIS — M6281 Muscle weakness (generalized): Secondary | ICD-10-CM | POA: Diagnosis not present

## 2021-01-27 DIAGNOSIS — M25661 Stiffness of right knee, not elsewhere classified: Secondary | ICD-10-CM | POA: Diagnosis not present

## 2021-02-01 DIAGNOSIS — M6281 Muscle weakness (generalized): Secondary | ICD-10-CM | POA: Diagnosis not present

## 2021-02-01 DIAGNOSIS — M25561 Pain in right knee: Secondary | ICD-10-CM | POA: Diagnosis not present

## 2021-02-01 DIAGNOSIS — M25661 Stiffness of right knee, not elsewhere classified: Secondary | ICD-10-CM | POA: Diagnosis not present

## 2021-02-01 DIAGNOSIS — G8929 Other chronic pain: Secondary | ICD-10-CM | POA: Diagnosis not present

## 2021-02-03 DIAGNOSIS — M25561 Pain in right knee: Secondary | ICD-10-CM | POA: Diagnosis not present

## 2021-02-03 DIAGNOSIS — G8929 Other chronic pain: Secondary | ICD-10-CM | POA: Diagnosis not present

## 2021-02-07 DIAGNOSIS — G8929 Other chronic pain: Secondary | ICD-10-CM | POA: Diagnosis not present

## 2021-02-07 DIAGNOSIS — M6281 Muscle weakness (generalized): Secondary | ICD-10-CM | POA: Diagnosis not present

## 2021-02-07 DIAGNOSIS — M25561 Pain in right knee: Secondary | ICD-10-CM | POA: Diagnosis not present

## 2021-02-07 DIAGNOSIS — M25661 Stiffness of right knee, not elsewhere classified: Secondary | ICD-10-CM | POA: Diagnosis not present

## 2021-02-09 DIAGNOSIS — M25661 Stiffness of right knee, not elsewhere classified: Secondary | ICD-10-CM | POA: Diagnosis not present

## 2021-02-09 DIAGNOSIS — M25561 Pain in right knee: Secondary | ICD-10-CM | POA: Diagnosis not present

## 2021-02-09 DIAGNOSIS — M6281 Muscle weakness (generalized): Secondary | ICD-10-CM | POA: Diagnosis not present

## 2021-02-09 DIAGNOSIS — G8929 Other chronic pain: Secondary | ICD-10-CM | POA: Diagnosis not present

## 2021-02-14 DIAGNOSIS — G8929 Other chronic pain: Secondary | ICD-10-CM | POA: Diagnosis not present

## 2021-02-14 DIAGNOSIS — M6281 Muscle weakness (generalized): Secondary | ICD-10-CM | POA: Diagnosis not present

## 2021-02-14 DIAGNOSIS — M25561 Pain in right knee: Secondary | ICD-10-CM | POA: Diagnosis not present

## 2021-02-14 DIAGNOSIS — M25661 Stiffness of right knee, not elsewhere classified: Secondary | ICD-10-CM | POA: Diagnosis not present

## 2021-02-17 DIAGNOSIS — G8929 Other chronic pain: Secondary | ICD-10-CM | POA: Diagnosis not present

## 2021-02-17 DIAGNOSIS — M6281 Muscle weakness (generalized): Secondary | ICD-10-CM | POA: Diagnosis not present

## 2021-02-17 DIAGNOSIS — M25661 Stiffness of right knee, not elsewhere classified: Secondary | ICD-10-CM | POA: Diagnosis not present

## 2021-02-17 DIAGNOSIS — M25561 Pain in right knee: Secondary | ICD-10-CM | POA: Diagnosis not present

## 2021-02-21 DIAGNOSIS — M25561 Pain in right knee: Secondary | ICD-10-CM | POA: Diagnosis not present

## 2021-02-21 DIAGNOSIS — G8929 Other chronic pain: Secondary | ICD-10-CM | POA: Diagnosis not present

## 2021-02-21 DIAGNOSIS — M25661 Stiffness of right knee, not elsewhere classified: Secondary | ICD-10-CM | POA: Diagnosis not present

## 2021-02-21 DIAGNOSIS — M6281 Muscle weakness (generalized): Secondary | ICD-10-CM | POA: Diagnosis not present

## 2021-02-23 DIAGNOSIS — G8929 Other chronic pain: Secondary | ICD-10-CM | POA: Diagnosis not present

## 2021-02-23 DIAGNOSIS — M25661 Stiffness of right knee, not elsewhere classified: Secondary | ICD-10-CM | POA: Diagnosis not present

## 2021-02-23 DIAGNOSIS — M6281 Muscle weakness (generalized): Secondary | ICD-10-CM | POA: Diagnosis not present

## 2021-02-23 DIAGNOSIS — M25561 Pain in right knee: Secondary | ICD-10-CM | POA: Diagnosis not present

## 2021-02-24 DIAGNOSIS — R3912 Poor urinary stream: Secondary | ICD-10-CM | POA: Diagnosis not present

## 2021-02-24 DIAGNOSIS — N401 Enlarged prostate with lower urinary tract symptoms: Secondary | ICD-10-CM | POA: Diagnosis not present

## 2021-02-24 DIAGNOSIS — R351 Nocturia: Secondary | ICD-10-CM | POA: Diagnosis not present

## 2021-02-24 DIAGNOSIS — R3915 Urgency of urination: Secondary | ICD-10-CM | POA: Diagnosis not present

## 2021-02-27 DIAGNOSIS — Z96651 Presence of right artificial knee joint: Secondary | ICD-10-CM | POA: Diagnosis not present

## 2021-02-28 DIAGNOSIS — M25661 Stiffness of right knee, not elsewhere classified: Secondary | ICD-10-CM | POA: Diagnosis not present

## 2021-02-28 DIAGNOSIS — G8929 Other chronic pain: Secondary | ICD-10-CM | POA: Diagnosis not present

## 2021-02-28 DIAGNOSIS — M6281 Muscle weakness (generalized): Secondary | ICD-10-CM | POA: Diagnosis not present

## 2021-02-28 DIAGNOSIS — M25561 Pain in right knee: Secondary | ICD-10-CM | POA: Diagnosis not present

## 2021-03-01 DIAGNOSIS — S3992XA Unspecified injury of lower back, initial encounter: Secondary | ICD-10-CM | POA: Diagnosis not present

## 2021-03-01 DIAGNOSIS — M545 Low back pain, unspecified: Secondary | ICD-10-CM | POA: Diagnosis not present

## 2021-03-01 DIAGNOSIS — M25552 Pain in left hip: Secondary | ICD-10-CM | POA: Diagnosis not present

## 2021-03-07 DIAGNOSIS — S32010A Wedge compression fracture of first lumbar vertebra, initial encounter for closed fracture: Secondary | ICD-10-CM | POA: Diagnosis not present

## 2021-03-09 ENCOUNTER — Other Ambulatory Visit: Payer: Self-pay | Admitting: Student

## 2021-03-09 DIAGNOSIS — S32010A Wedge compression fracture of first lumbar vertebra, initial encounter for closed fracture: Secondary | ICD-10-CM

## 2021-03-10 DIAGNOSIS — M25661 Stiffness of right knee, not elsewhere classified: Secondary | ICD-10-CM | POA: Diagnosis not present

## 2021-03-10 DIAGNOSIS — M25561 Pain in right knee: Secondary | ICD-10-CM | POA: Diagnosis not present

## 2021-03-10 DIAGNOSIS — M6281 Muscle weakness (generalized): Secondary | ICD-10-CM | POA: Diagnosis not present

## 2021-03-10 DIAGNOSIS — G8929 Other chronic pain: Secondary | ICD-10-CM | POA: Diagnosis not present

## 2021-03-14 ENCOUNTER — Other Ambulatory Visit: Payer: Self-pay

## 2021-03-14 ENCOUNTER — Ambulatory Visit
Admission: RE | Admit: 2021-03-14 | Discharge: 2021-03-14 | Disposition: A | Discharge status: Home / Self Care | Payer: Medicare Other | Source: Ambulatory Visit | Attending: Student | Admitting: Student

## 2021-03-14 DIAGNOSIS — S32010A Wedge compression fracture of first lumbar vertebra, initial encounter for closed fracture: Secondary | ICD-10-CM | POA: Diagnosis not present

## 2021-03-14 DIAGNOSIS — L819 Disorder of pigmentation, unspecified: Secondary | ICD-10-CM | POA: Diagnosis not present

## 2021-03-14 DIAGNOSIS — L905 Scar conditions and fibrosis of skin: Secondary | ICD-10-CM | POA: Diagnosis not present

## 2021-03-14 DIAGNOSIS — L570 Actinic keratosis: Secondary | ICD-10-CM | POA: Diagnosis not present

## 2021-03-14 DIAGNOSIS — Z85820 Personal history of malignant melanoma of skin: Secondary | ICD-10-CM | POA: Diagnosis not present

## 2021-03-14 DIAGNOSIS — L814 Other melanin hyperpigmentation: Secondary | ICD-10-CM | POA: Diagnosis not present

## 2021-03-14 DIAGNOSIS — L111 Transient acantholytic dermatosis [Grover]: Secondary | ICD-10-CM | POA: Diagnosis not present

## 2021-03-14 DIAGNOSIS — D229 Melanocytic nevi, unspecified: Secondary | ICD-10-CM | POA: Diagnosis not present

## 2021-03-14 DIAGNOSIS — M545 Low back pain, unspecified: Secondary | ICD-10-CM | POA: Diagnosis not present

## 2021-03-14 DIAGNOSIS — L821 Other seborrheic keratosis: Secondary | ICD-10-CM | POA: Diagnosis not present

## 2021-03-14 IMAGING — MR MR LUMBAR SPINE W/O CM
5 series · 31 of 48 positions shown · non-contrast
Comparison: None.

CLINICAL DATA: Fall 2 weeks ago. Low back pain radiating down both
legs

EXAM:
MRI LUMBAR SPINE WITHOUT CONTRAST
TECHNIQUE: Multiplanar, multisequence MR imaging of the lumbar spine was
performed. No intravenous contrast was administered.

[Series 5: T2 · sagittal · 4.0mm · 0.81mm/px · 6 of 17 slices shown (1 of 2)]
[im 1/17]
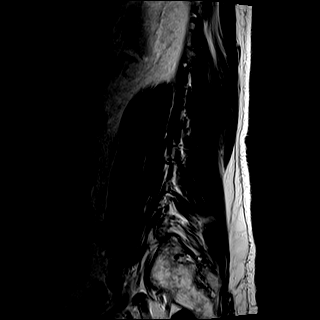
[im 4/17]
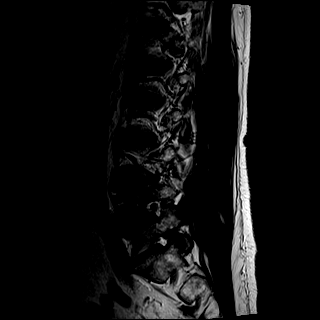
[im 7/17]
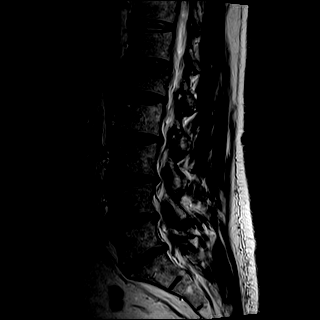
[im 10/17]
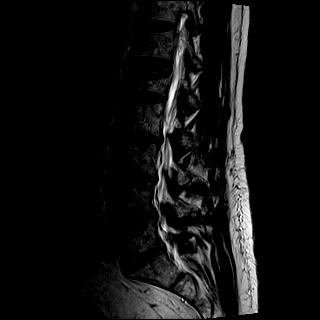
[im 13/17]
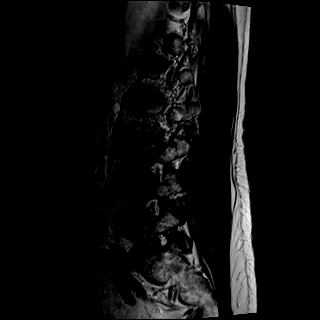
[im 17/17]
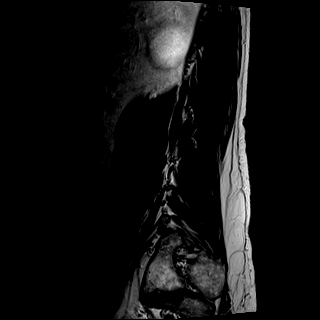

[Series 6: T1 · sagittal · 4.0mm · 0.81mm/px · 7 of 17 slices shown (1 of 2)]
[im 1/17]
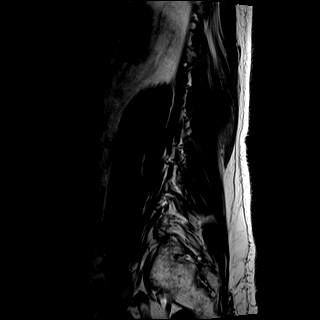
[im 3/17]
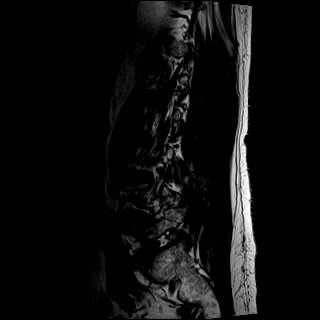
[im 6/17]
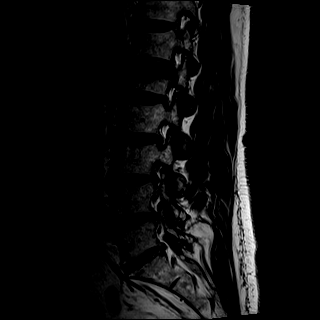
[im 9/17]
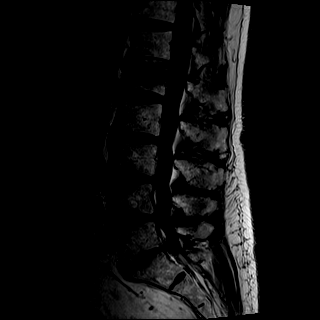
[im 11/17]
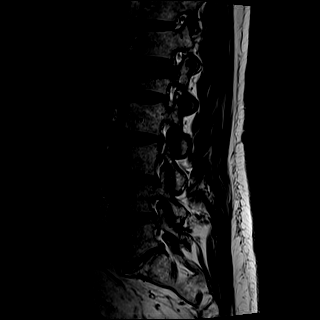
[im 14/17]
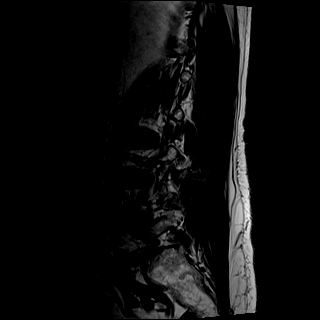
[im 17/17]
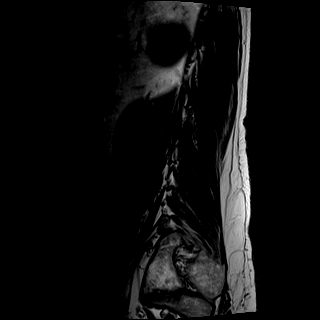

[Series 7: STIR · sagittal · 4.0mm · 0.41mm/px · 2 of 17 slices shown]
[im 1/17]
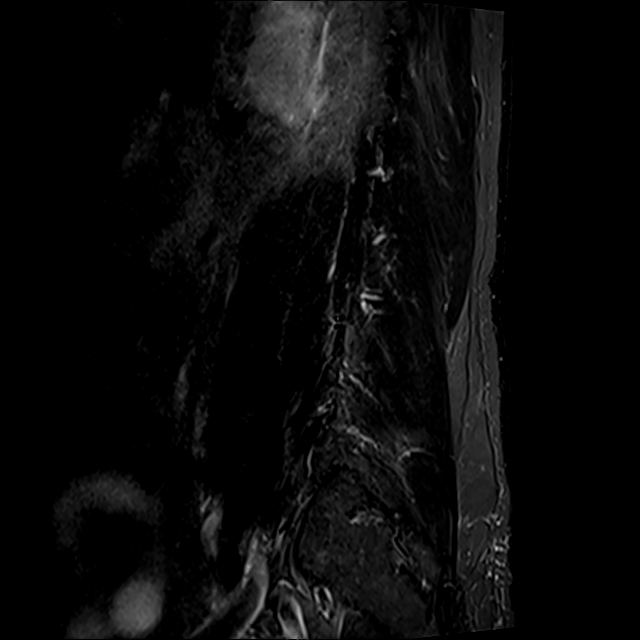
[im 3/17]
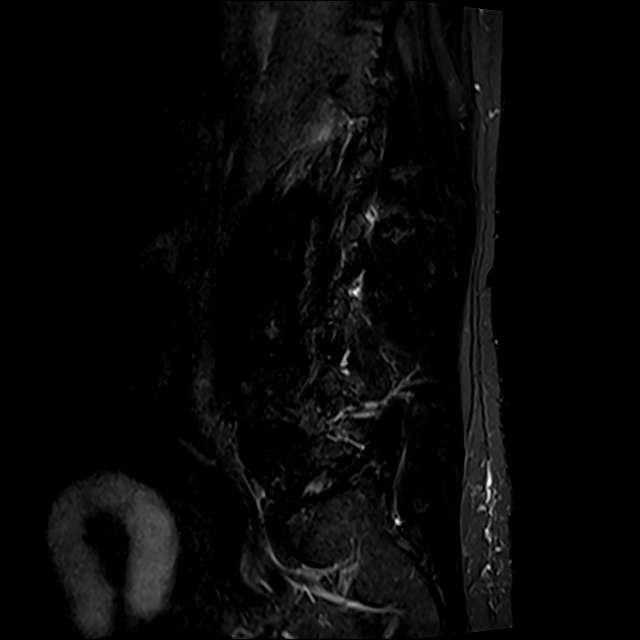

[Series 8: T2 · axial · 4.0mm · 0.78mm/px · z∈[-122,+81]mm · 8 of 36 slices shown (2 of 2)]
[im 1/36]
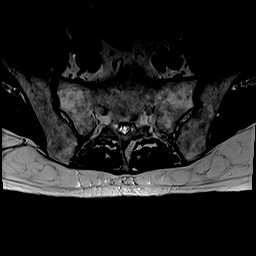
[im 6/36]
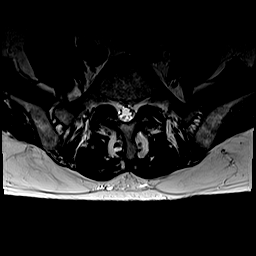
[im 11/36]
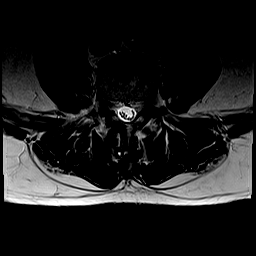
[im 17/36]
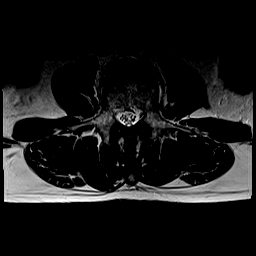
[im 19/36]
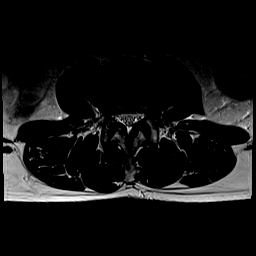
[im 25/36]
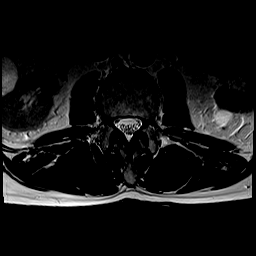
[im 30/36]
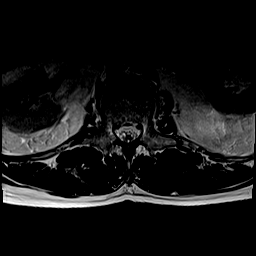
[im 36/36]
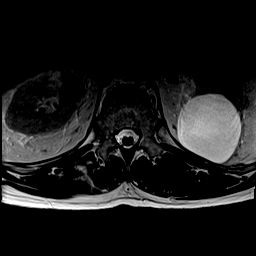

[Series 9: T1 · axial · 4.0mm · 0.39mm/px · z∈[-122,+81]mm · 8 of 36 slices shown (2 of 2)]
[im 1/36]
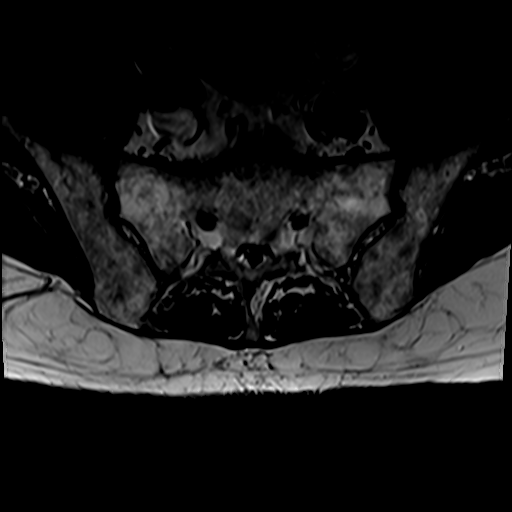
[im 6/36]
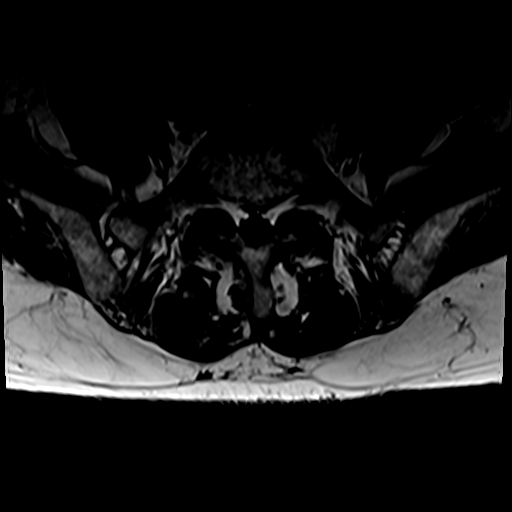
[im 11/36]
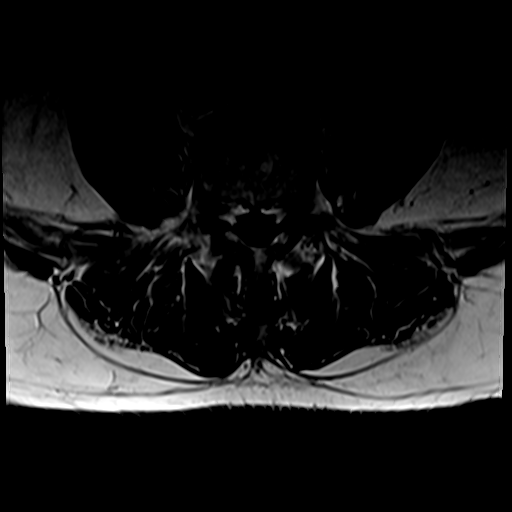
[im 17/36]
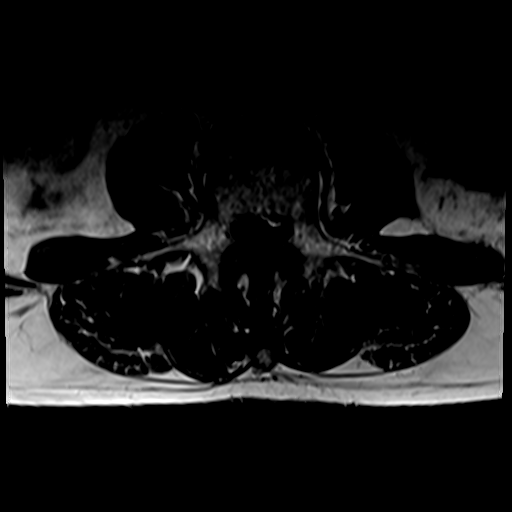
[im 19/36]
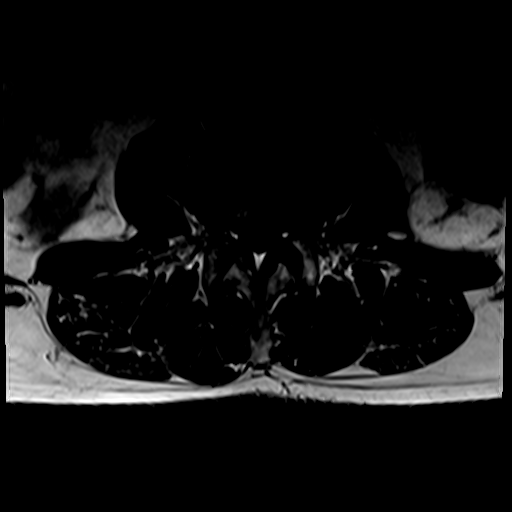
[im 25/36]
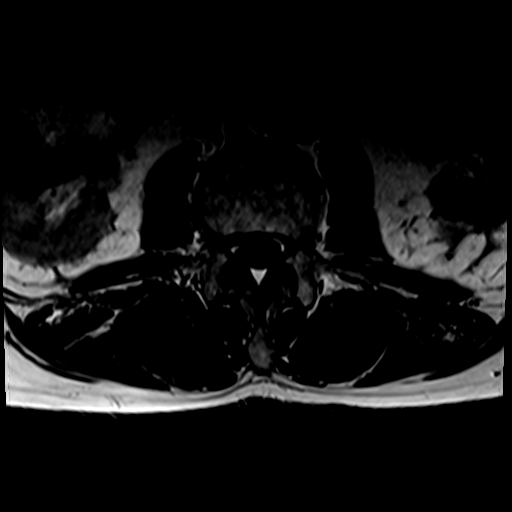
[im 30/36]
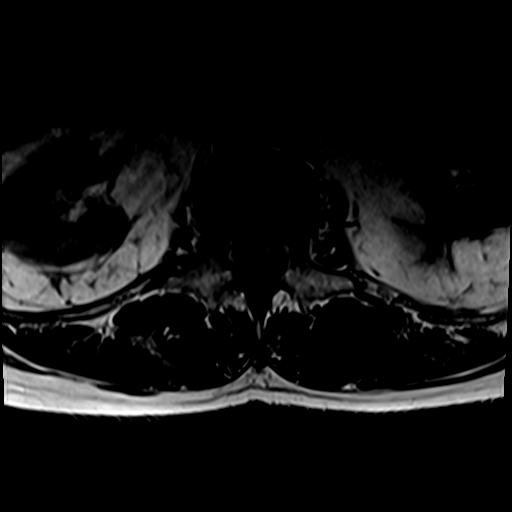
[im 36/36]
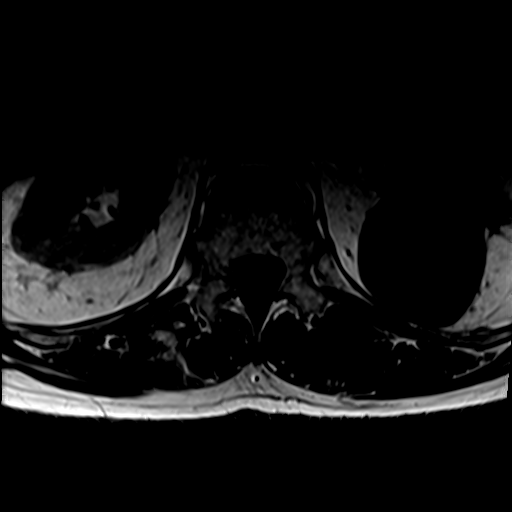

[31 of 48 positions shown; findings below may reference images not displayed]

FINDINGS: Segmentation:  Standard.

Alignment:  No significant anteroposterior listhesis.

Vertebrae: There is compression deformity of L1 with less than 50%
loss of height at the superior endplate. Underlying marrow edema is
present. There is no significant endplate retropulsion. Vertebral
body heights and marrow signal otherwise within normal limits.

Conus medullaris and cauda equina: Conus extends to the L1 level.
Conus and cauda equina appear normal.

Paraspinal and other soft tissues: Bilateral renal cysts.

Disc levels:

L1-L2: Minimal disc bulge. Minor facet arthropathy. No canal or
foraminal stenosis.

L2-L3: Disc bulge. Mild facet arthropathy with ligamentum flavum
infolding. Mild canal stenosis. No right foraminal stenosis. Minor
left foraminal stenosis.

L3-L4: Disc bulge. Mild facet arthropathy with ligamentum flavum
infolding. Minor canal stenosis. Mild foraminal stenosis.

L4-L5: Disc bulge with superimposed small central disc extrusion
extending slightly below the disc level. Mild facet arthropathy with
ligamentum flavum infolding. Mild canal stenosis. Mild foraminal
stenosis.

L5-S1: Disc bulge with endplate osteophytic ridging slightly
eccentric to the left. Mild facet arthropathy with ligamentum flavum
infolding. No canal stenosis. Minor foraminal stenosis.
Disc/osteophyte contacts the exiting left L5 nerve root.
IMPRESSION: Acute or subacute L1 compression fracture with less than 50% loss of
vertebral body height. No significant endplate retropulsion.

Mild multilevel degenerative changes is detailed above without
high-grade stenosis.

## 2021-03-31 ENCOUNTER — Other Ambulatory Visit: Payer: Self-pay | Admitting: Orthopedic Surgery

## 2021-03-31 DIAGNOSIS — S32010A Wedge compression fracture of first lumbar vertebra, initial encounter for closed fracture: Secondary | ICD-10-CM | POA: Diagnosis not present

## 2021-04-03 ENCOUNTER — Ambulatory Visit: Payer: Medicare Other | Admitting: Registered Nurse

## 2021-04-03 ENCOUNTER — Ambulatory Visit: Payer: Medicare Other

## 2021-04-03 ENCOUNTER — Encounter
Admission: RE | Disposition: A | Discharge status: Home / Self Care | Payer: Self-pay | Source: Ambulatory Visit | Attending: Orthopedic Surgery

## 2021-04-03 ENCOUNTER — Other Ambulatory Visit: Payer: Self-pay

## 2021-04-03 ENCOUNTER — Ambulatory Visit
Admission: RE | Admit: 2021-04-03 | Discharge: 2021-04-03 | Disposition: A | Discharge status: Home / Self Care | Payer: Medicare Other | Source: Ambulatory Visit | Attending: Orthopedic Surgery | Admitting: Orthopedic Surgery

## 2021-04-03 ENCOUNTER — Encounter: Payer: Self-pay | Admitting: Orthopedic Surgery

## 2021-04-03 DIAGNOSIS — Z882 Allergy status to sulfonamides status: Secondary | ICD-10-CM | POA: Diagnosis not present

## 2021-04-03 DIAGNOSIS — Z881 Allergy status to other antibiotic agents status: Secondary | ICD-10-CM | POA: Diagnosis not present

## 2021-04-03 DIAGNOSIS — S32010A Wedge compression fracture of first lumbar vertebra, initial encounter for closed fracture: Secondary | ICD-10-CM | POA: Insufficient documentation

## 2021-04-03 DIAGNOSIS — M8088XA Other osteoporosis with current pathological fracture, vertebra(e), initial encounter for fracture: Secondary | ICD-10-CM | POA: Diagnosis not present

## 2021-04-03 DIAGNOSIS — Z87891 Personal history of nicotine dependence: Secondary | ICD-10-CM | POA: Diagnosis not present

## 2021-04-03 DIAGNOSIS — W19XXXA Unspecified fall, initial encounter: Secondary | ICD-10-CM | POA: Insufficient documentation

## 2021-04-03 DIAGNOSIS — Z888 Allergy status to other drugs, medicaments and biological substances status: Secondary | ICD-10-CM | POA: Diagnosis not present

## 2021-04-03 DIAGNOSIS — Z91013 Allergy to seafood: Secondary | ICD-10-CM | POA: Diagnosis not present

## 2021-04-03 DIAGNOSIS — Z419 Encounter for procedure for purposes other than remedying health state, unspecified: Secondary | ICD-10-CM

## 2021-04-03 DIAGNOSIS — Z79899 Other long term (current) drug therapy: Secondary | ICD-10-CM | POA: Diagnosis not present

## 2021-04-03 DIAGNOSIS — S32009A Unspecified fracture of unspecified lumbar vertebra, initial encounter for closed fracture: Secondary | ICD-10-CM | POA: Diagnosis not present

## 2021-04-03 HISTORY — PX: KYPHOPLASTY: SHX5884

## 2021-04-03 IMAGING — XA DG C-ARM 1-60 MIN
3 series · 3 of 3 positions shown · IV contrast (agent unspecified)
Comparison: Lumbar spine MRI-[DATE]

CLINICAL DATA: Kyphoplasty and bone biopsy.

EXAM:
DG C-ARM 1-60 MIN; LUMBAR SPINE - 2-3 VIEW
CONTRAST:  None
FLUOROSCOPY TIME:  2 minutes, 19 seconds

[Series 1: cont. · 1 of 1 slices shown (1 of 2)]
[im 1/1]
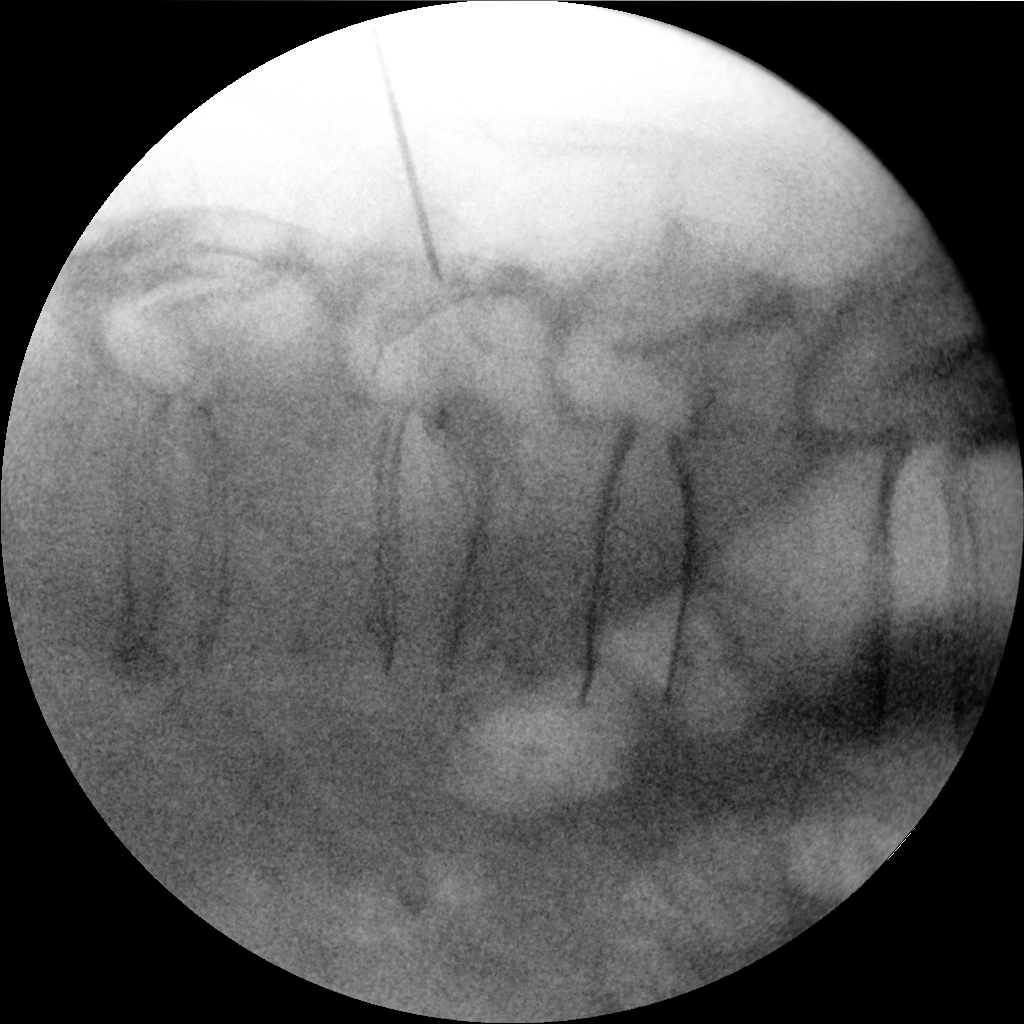

[Series 1: dg x-ray · 0.14mm/px · 1 of 1 slices shown]
[im 1/1]
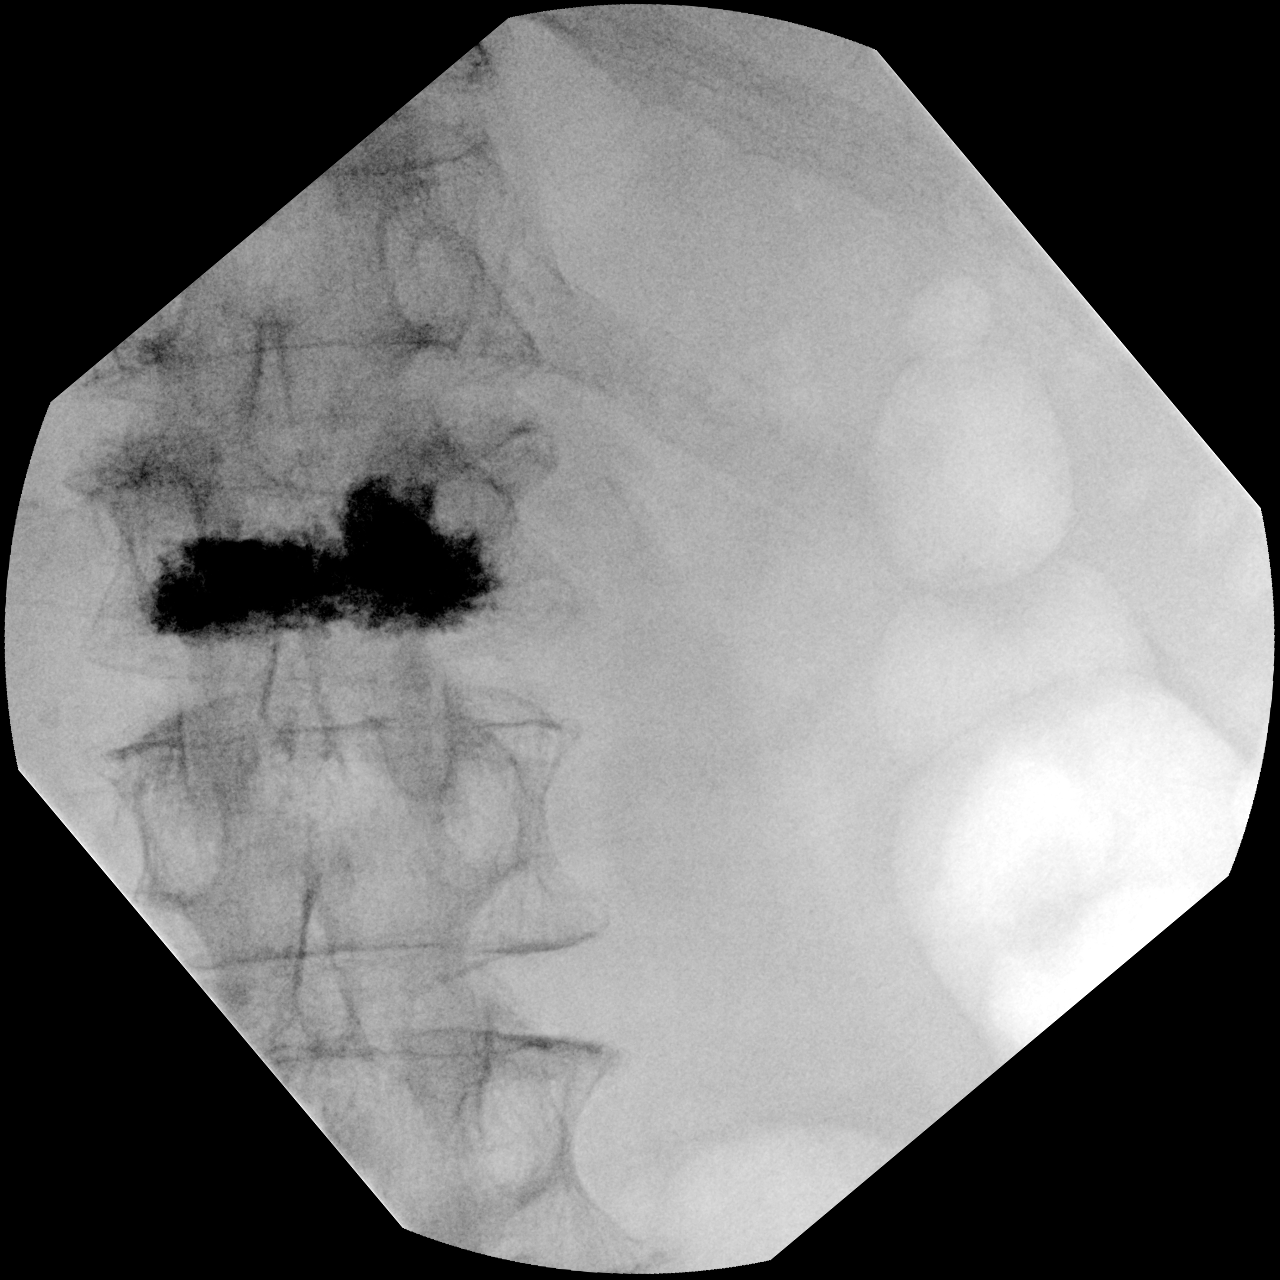

[Series 2: cont. · 1 of 1 slices shown (2 of 2)]
[im 1/1]
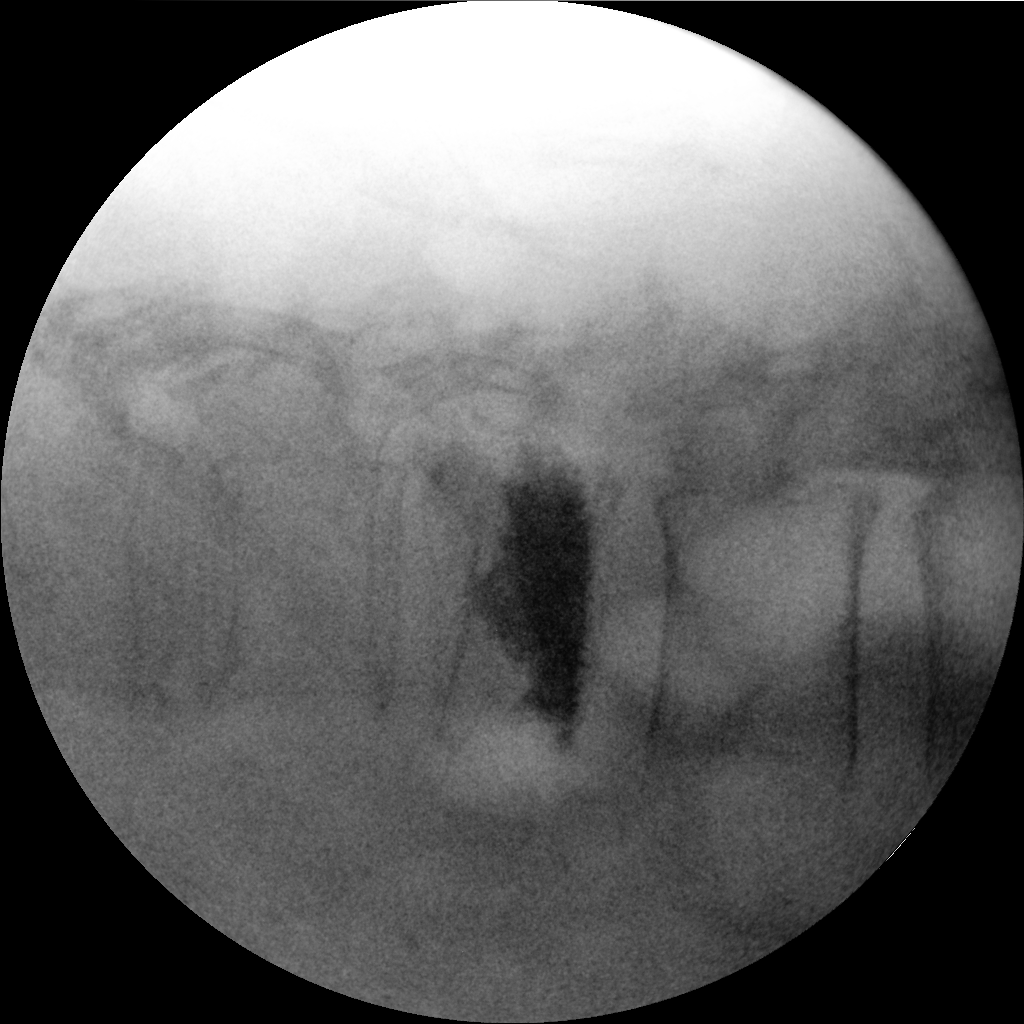

[3 of 3 positions shown; findings below may reference images not displayed]

FINDINGS: Three spot intraoperative fluoroscopic images of the lumbar spine
are provided for review. Spinal labeling is in keeping with
preprocedural lumbar spine MRI.

Provided images demonstrate the sequela of L1 vertebral body
kyphoplasty. Cement appears appropriately positioned within the
vertebral body without evidence of epidural venous contamination or
extension to the level of the spinal canal.
IMPRESSION: Post L1 kyphoplasty without evidence of complication.

## 2021-04-03 IMAGING — XA DG LUMBAR SPINE 2-3V
3 series · 3 of 3 positions shown · IV contrast (agent unspecified)
Comparison: Lumbar spine MRI-[DATE]

CLINICAL DATA: Kyphoplasty and bone biopsy.

EXAM:
DG C-ARM 1-60 MIN; LUMBAR SPINE - 2-3 VIEW
CONTRAST:  None
FLUOROSCOPY TIME:  2 minutes, 19 seconds

[Series 1: cont. · 1 of 1 slices shown (1 of 2)]
[im 1/1]
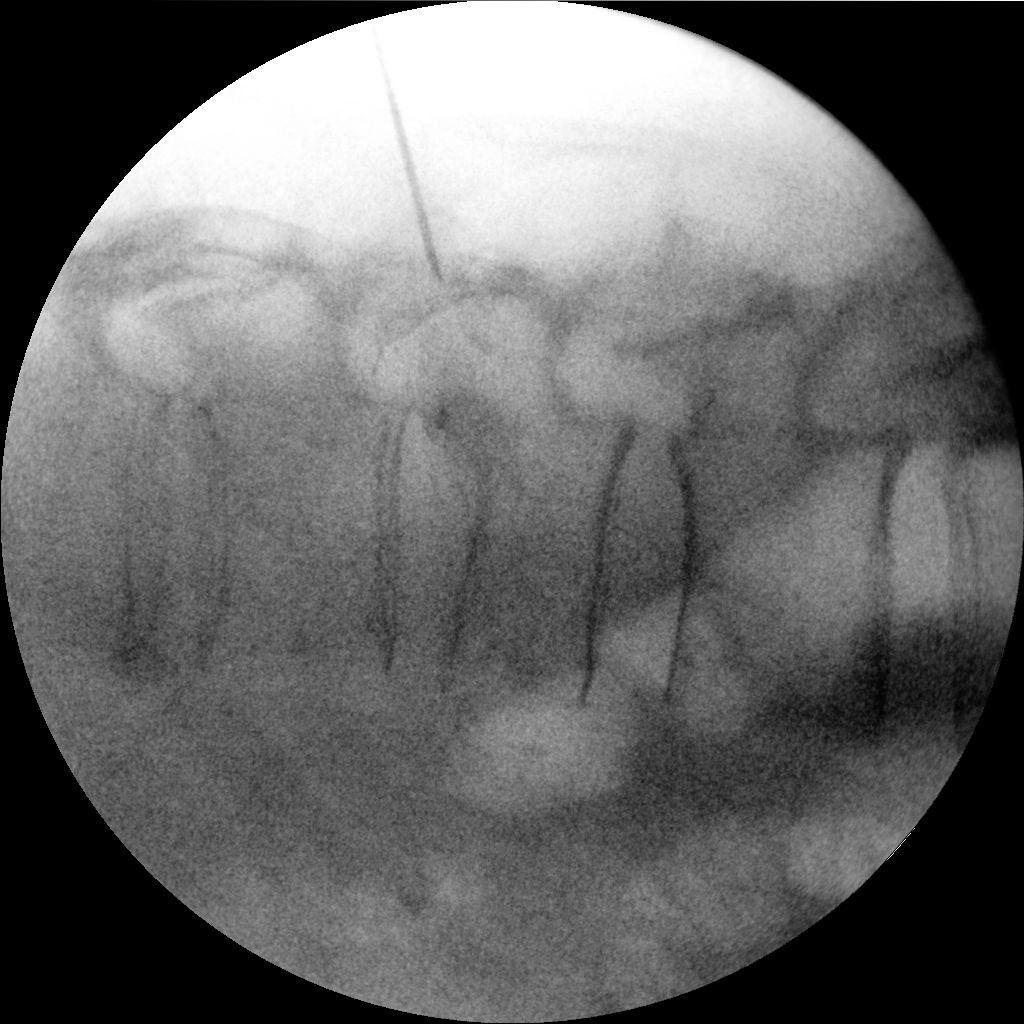

[Series 1: dg x-ray · 0.14mm/px · 1 of 1 slices shown]
[im 1/1]
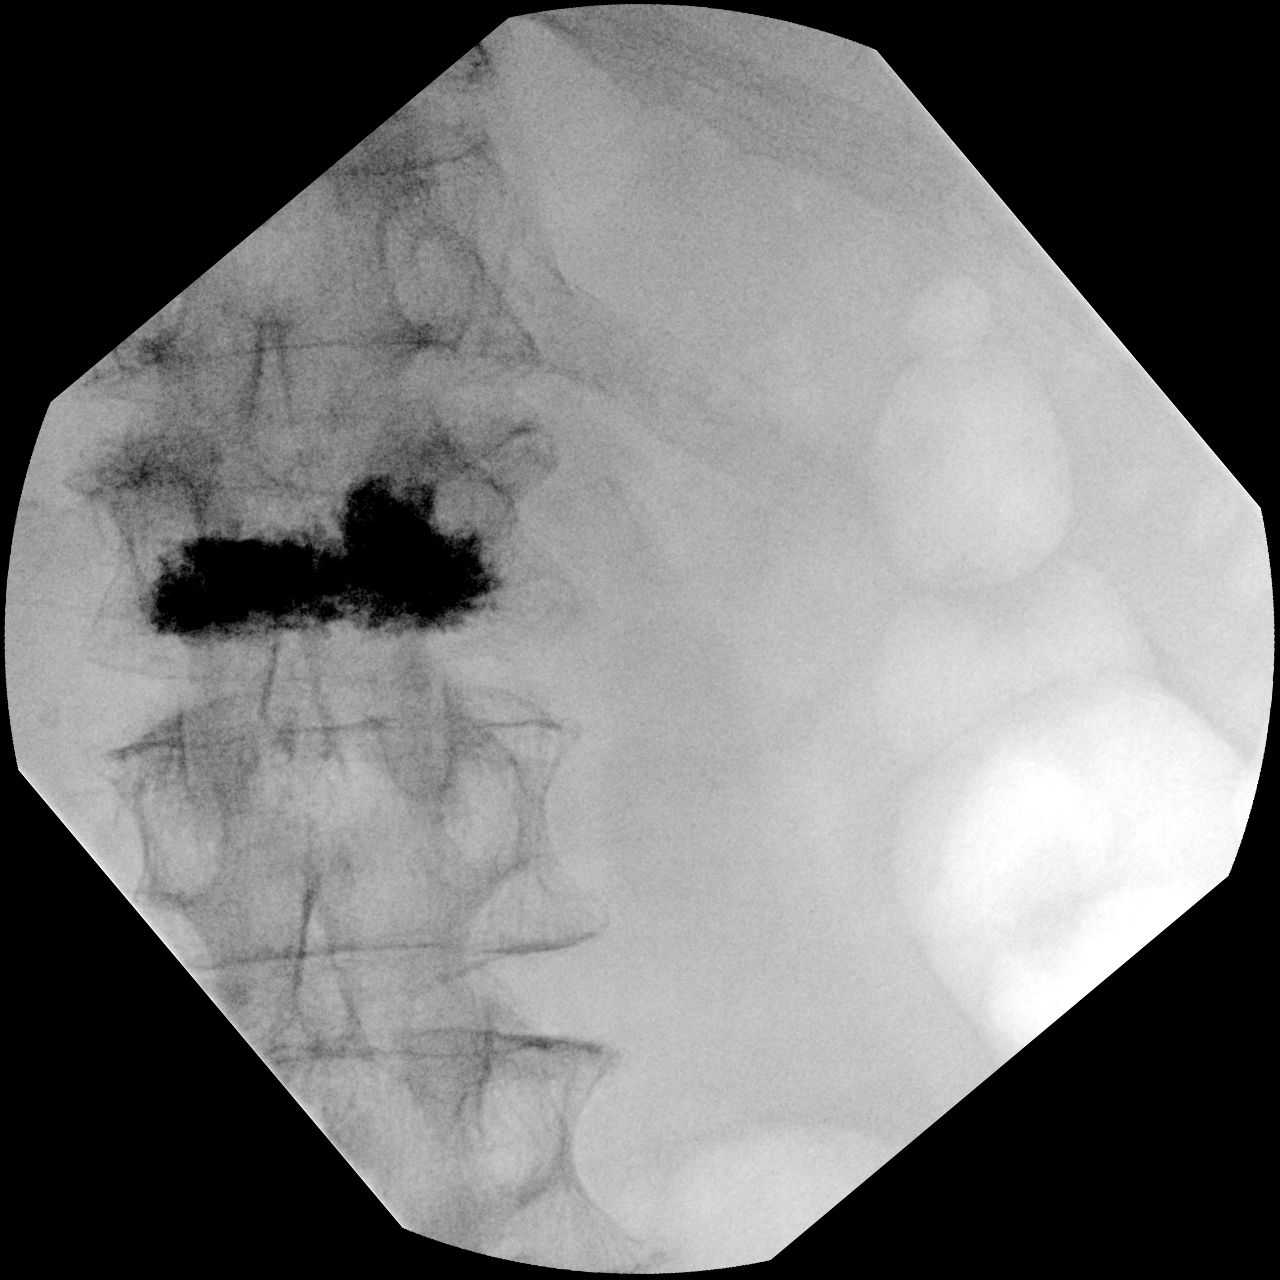

[Series 2: cont. · 1 of 1 slices shown (2 of 2)]
[im 1/1]
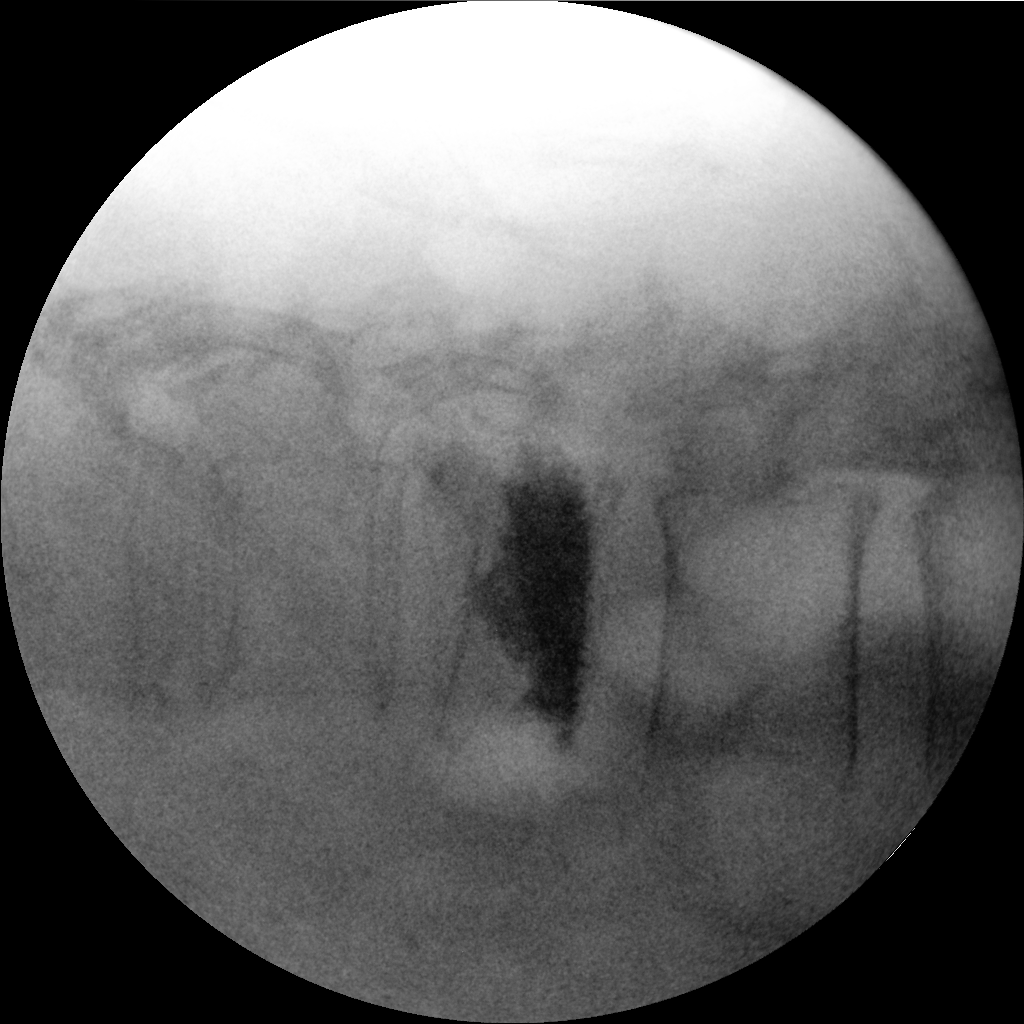

[3 of 3 positions shown; findings below may reference images not displayed]

FINDINGS: Three spot intraoperative fluoroscopic images of the lumbar spine
are provided for review. Spinal labeling is in keeping with
preprocedural lumbar spine MRI.

Provided images demonstrate the sequela of L1 vertebral body
kyphoplasty. Cement appears appropriately positioned within the
vertebral body without evidence of epidural venous contamination or
extension to the level of the spinal canal.
IMPRESSION: Post L1 kyphoplasty without evidence of complication.

## 2021-04-03 SURGERY — KYPHOPLASTY
Anesthesia: Monitor Anesthesia Care

## 2021-04-03 MED ORDER — ONDANSETRON HCL 4 MG/2ML IJ SOLN: 4.0000 mg | Freq: Four times a day (QID) | INTRAMUSCULAR | Status: DC | PRN

## 2021-04-03 MED ORDER — ONDANSETRON HCL 4 MG PO TABS: 4.0000 mg | ORAL_TABLET | Freq: Four times a day (QID) | ORAL | Status: DC | PRN

## 2021-04-03 MED ORDER — PROPOFOL 500 MG/50ML IV EMUL
INTRAVENOUS | Status: DC | PRN
  Administered 2021-04-03: 100 ug/kg/min via INTRAVENOUS

## 2021-04-03 MED ORDER — LIDOCAINE HCL (PF) 1 % IJ SOLN
INTRAMUSCULAR | Status: AC
  Filled 2021-04-03: qty 60

## 2021-04-03 MED ORDER — PROPOFOL 10 MG/ML IV BOLUS
INTRAVENOUS | Status: AC
  Filled 2021-04-03: qty 20

## 2021-04-03 MED ORDER — PROPOFOL 10 MG/ML IV BOLUS
INTRAVENOUS | Status: DC | PRN
  Administered 2021-04-03: 50 mg via INTRAVENOUS

## 2021-04-03 MED ORDER — VANCOMYCIN HCL IN DEXTROSE 1-5 GM/200ML-% IV SOLN: 1000.0000 mg | INTRAVENOUS | Status: AC

## 2021-04-03 MED ORDER — FENTANYL CITRATE (PF) 100 MCG/2ML IJ SOLN: 25.0000 ug | INTRAMUSCULAR | Status: DC | PRN

## 2021-04-03 MED ORDER — LACTATED RINGERS IV SOLN: INTRAVENOUS | Status: DC

## 2021-04-03 MED ORDER — FENTANYL CITRATE (PF) 100 MCG/2ML IJ SOLN
INTRAMUSCULAR | Status: DC | PRN
  Administered 2021-04-03 (×4): 25 ug via INTRAVENOUS

## 2021-04-03 MED ORDER — CHLORHEXIDINE GLUCONATE 0.12 % MT SOLN: 15.0000 mL | Freq: Once | OROMUCOSAL | Status: AC

## 2021-04-03 MED ORDER — HYDROCODONE-ACETAMINOPHEN 5-325 MG PO TABS: 1.0000 | ORAL_TABLET | Freq: Four times a day (QID) | ORAL | 0 refills | Status: AC | PRN

## 2021-04-03 MED ORDER — PHENYLEPHRINE HCL (PRESSORS) 10 MG/ML IV SOLN
INTRAVENOUS | Status: DC | PRN
  Administered 2021-04-03: 100 ug via INTRAVENOUS

## 2021-04-03 MED ORDER — ORAL CARE MOUTH RINSE
15.0000 mL | Freq: Once | OROMUCOSAL | Status: AC
Start: 2021-04-03 — End: 2021-04-03

## 2021-04-03 MED ORDER — METOCLOPRAMIDE HCL 5 MG/ML IJ SOLN: 5.0000 mg | Freq: Three times a day (TID) | INTRAMUSCULAR | Status: DC | PRN

## 2021-04-03 MED ORDER — BUPIVACAINE-EPINEPHRINE (PF) 0.5% -1:200000 IJ SOLN
INTRAMUSCULAR | Status: DC | PRN
  Administered 2021-04-03: 10 mL

## 2021-04-03 MED ORDER — ONDANSETRON HCL 4 MG/2ML IJ SOLN
4.0000 mg | Freq: Once | INTRAMUSCULAR | Status: DC | PRN
Start: 2021-04-03 — End: 2021-04-03

## 2021-04-03 MED ORDER — METOCLOPRAMIDE HCL 10 MG PO TABS
5.0000 mg | ORAL_TABLET | Freq: Three times a day (TID) | ORAL | Status: DC | PRN
Start: 2021-04-03 — End: 2021-04-03

## 2021-04-03 MED ORDER — ONDANSETRON HCL 4 MG/2ML IJ SOLN
INTRAMUSCULAR | Status: DC | PRN
  Administered 2021-04-03: 4 mg via INTRAVENOUS

## 2021-04-03 MED ORDER — BUPIVACAINE-EPINEPHRINE (PF) 0.5% -1:200000 IJ SOLN
INTRAMUSCULAR | Status: AC
  Filled 2021-04-03: qty 30

## 2021-04-03 MED ORDER — CHLORHEXIDINE GLUCONATE 0.12 % MT SOLN
OROMUCOSAL | Status: AC
  Administered 2021-04-03: 15 mL via OROMUCOSAL
  Filled 2021-04-03: qty 15

## 2021-04-03 MED ORDER — SODIUM CHLORIDE 0.9 % IV SOLN
INTRAVENOUS | Status: DC | PRN
  Administered 2021-04-03: 20 ug/min via INTRAVENOUS

## 2021-04-03 MED ORDER — FENTANYL CITRATE (PF) 100 MCG/2ML IJ SOLN
INTRAMUSCULAR | Status: AC
  Filled 2021-04-03: qty 2

## 2021-04-03 MED ORDER — SODIUM CHLORIDE 0.9 % IV SOLN: INTRAVENOUS | Status: DC

## 2021-04-03 MED ORDER — LIDOCAINE HCL 1 % IJ SOLN
INTRAMUSCULAR | Status: DC | PRN
  Administered 2021-04-03: 20 mL

## 2021-04-03 MED ORDER — VANCOMYCIN HCL IN DEXTROSE 1-5 GM/200ML-% IV SOLN
INTRAVENOUS | Status: AC
  Administered 2021-04-03: 1000 mg via INTRAVENOUS
  Filled 2021-04-03: qty 200

## 2021-04-03 SURGICAL SUPPLY — 22 items
ADH SKN CLS APL DERMABOND .7 (GAUZE/BANDAGES/DRESSINGS) ×1
CEMENT KYPHON CX01A KIT/MIXER (Cement) ×2 IMPLANT
COVER WAND RF STERILE (DRAPES) ×2 IMPLANT
DERMABOND ADVANCED (GAUZE/BANDAGES/DRESSINGS) ×1
DERMABOND ADVANCED .7 DNX12 (GAUZE/BANDAGES/DRESSINGS) ×1 IMPLANT
DEVICE BIOPSY BONE KYPHX (INSTRUMENTS) ×2 IMPLANT
DRAPE C-ARM XRAY 36X54 (DRAPES) ×2 IMPLANT
DURAPREP 26ML APPLICATOR (WOUND CARE) ×2 IMPLANT
FEE RENTAL RFA GENERATOR (MISCELLANEOUS) IMPLANT
GLOVE SURG SYN 9.0  PF PI (GLOVE) ×1
GLOVE SURG SYN 9.0 PF PI (GLOVE) ×1 IMPLANT
GOWN SRG 2XL LVL 4 RGLN SLV (GOWNS) ×1 IMPLANT
GOWN STRL NON-REIN 2XL LVL4 (GOWNS) ×2
GOWN STRL REUS W/ TWL LRG LVL3 (GOWN DISPOSABLE) ×1 IMPLANT
GOWN STRL REUS W/TWL LRG LVL3 (GOWN DISPOSABLE) ×2
MANIFOLD NEPTUNE II (INSTRUMENTS) ×1 IMPLANT
PACK KYPHOPLASTY (MISCELLANEOUS) ×2 IMPLANT
RENTAL RFA GENERATOR (MISCELLANEOUS) IMPLANT
STRAP SAFETY 5IN WIDE (MISCELLANEOUS) ×2 IMPLANT
SWABSTK COMLB BENZOIN TINCTURE (MISCELLANEOUS) ×2 IMPLANT
TRAY KYPHOPAK 15/3 EXPRESS 1ST (MISCELLANEOUS) ×1 IMPLANT
TRAY KYPHOPAK 20/3 EXPRESS 1ST (MISCELLANEOUS) ×2 IMPLANT

## 2021-04-03 NOTE — Discharge Instructions (Addendum)
AMBULATORY SURGERY  DISCHARGE INSTRUCTIONS   1) The drugs that you were given will stay in your system until tomorrow so for the next 24 hours you should not:  A) Drive an automobile B) Make any legal decisions C) Drink any alcoholic beverage   2) You may resume regular meals tomorrow.  Today it is better to start with liquids and gradually work up to solid foods.  You may eat anything you prefer, but it is better to start with liquids, then soup and crackers, and gradually work up to solid foods.   3) Please notify your doctor immediately if you have any unusual bleeding, trouble breathing, redness and pain at the surgery site, drainage, fever, or pain not relieved by medication.    4) Additional Instructions:  Please contact your physician with any problems or Same Day Surgery at [phone], Monday through Friday 6 am to 4 pm, or [place] at [facility] number at [phone].Take it easy today and try to start walking is much as you can starting tomorrow. Remove Band-Aids on Wednesday then okay to shower Pain medicine as directed Call office if you are having problems.

## 2021-04-03 NOTE — Op Note (Signed)
04/03/2021  1:20 PM  PATIENT:  Larry Crawford   MRN: [id number]   PRE-OPERATIVE DIAGNOSIS:  closed wedge compression fracture of L1   POST-OPERATIVE DIAGNOSIS:  closed wedge compression fracture of L1   PROCEDURE:  Procedure(s): KYPHOPLASTY L1  SURGEON: Laurene Footman, MD   ASSISTANTS: None   ANESTHESIA:   local and MAC   EBL:  No intake/output data recorded.   BLOOD ADMINISTERED:none   DRAINS: none    LOCAL MEDICATIONS USED:  MARCAINE    and XYLOCAINE    SPECIMEN:   L1 vertebral body biopsy   DISPOSITION OF SPECIMEN:  Pathology   COUNTS:  YES   TOURNIQUET:  * No tourniquets in log *   IMPLANTS: Bone cement   DICTATION: .Dragon Dictation  patient was brought to the operating room and after adequate anesthesia was obtained the patient was placed prone.  C arm was brought in in good visualization of the affected level obtained on both AP and lateral projections.  After patient identification and timeout procedures were completed, local anesthetic was infiltrated with 10 cc 1% Xylocaine infiltrated subcutaneously.  This is done the area on the each side of the planned approach.  The back was then prepped and draped in the usual sterile manner and repeat timeout procedure carried out.  A spinal needle was brought down to the pedicle on the each side of  L1 and a 50-50 mix of 1% Xylocaine half percent Sensorcaine with epinephrine total of 20 cc injected on each side.  After allowing this to set a small incision was made and the trocar was advanced into the vertebral body in an extrapedicular fashion.  Biopsy was obtained Drilling was carried out balloon inserted with inflation to 2 cc on the right and 1.5 cc on the left.  When the cement was appropriate consistency 2.5 cc were injected on the right and 2.5 cc on the left into the vertebral body without extravasation, good fill superior to inferior endplates and from right to left sides along the inferior endplate.  After the cement  had set the trochar was removed and permanent C-arm views obtained.  The wound was closed with Dermabond followed by Band-Aid   PLAN OF CARE:  Discharge home after recovery room   PATIENT DISPOSITION:  PACU - hemodynamically stable.

## 2021-04-03 NOTE — H&P (Signed)
Chief Complaint  Patient presents with  . Middle Back - Pain    History of the Present Illness: Larry Crawford is a 80 y.o. male here today.   The patient presents for evaluation of compression fracture. He had an MRI on 03/14/2021 at [facility] that showed an L1 50 percent compression fracture with marrow edema. He is having severe pain. The patient has previously seen Cameron Proud, PA here in the office and discussed surgery. He comes in today to discuss when this could be done.  The patient states he fell down on concrete on 03/01/2021. He locates his pain to the low back with radiation to the sides and into the buttock. He tells me his feet get numb. When he tries to get up from bed, he has to do it on his stomach. He tells me this morning he had constipation. He tells me his pain is not as severe at times. He was taking hydrocodone, it was working; however, he is out of them. He tells me he took 1 ibuprofen last night.  He is not on an anticoagulant.   The patient is retired. He lives with his wife.   I have reviewed past medical, surgical, social and family history, and allergies as documented in the EMR.  Past Medical History: Past Medical History:  Diagnosis Date  . Allergic rhinitis 09/28/2015  . Allergic rhinitis due to pollen 01/24/2021  Mar 31, 2007 Entered By: Threasa Alpha D Comment: sinus surgery 1/08  . 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 Axillae and upper legs.  . Anesthesia complication  Spinal did not take w/ 1 hernia repair  . Arthritis 07/16/2016  . BPH (benign prostatic hyperplasia) 09/26/2015  Formatting of this note might be different from the original. Followed [facility]. Failed Flomax due to retrograde ejaculation. Failed Proscar and Uroxatrol.  Marland Kitchen BPH (benign prostatic hypertrophy)  . Complex tear of medial meniscus of right knee as current injury 11/28/2020  . Depression  . Disorder of bursae of shoulder region 01/24/2021  .  ED (erectile dysfunction)  . Elevated prostate specific antigen (PSA) 08/25/2012  . Gout  . History of depression 09/26/2015  . History of elevated PSA  . History of MRSA infection ~2000  nasal  . Hyperglycemia 12/14/2016  . Hypogonadism male  . IBS (irritable bowel syndrome) 03/29/2009  . 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 Left lumbar facet pain 05/11/2015  . Low back pain 01/24/2021  . Lumbar radiculopathy 01/24/2021  . Mixed hyperlipidemia 03/15/2009  Formatting of this note might be different from the original. baseline cholesterol = 265, baseline LDL = 180. Intolerant to pravastatin and colestid  . Moderate episode of recurrent major depressive disorder (CMS-HCC) 09/26/2015  . Other specified disease of sebaceous glands 01/24/2021  . 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 Reason for consultation 01/24/2021  Aug 21, 2005 Entered By: Threasa Alpha D Comment: 1st pneumovac today, repeat in 5 yrs-10/11Jan 11, 2013 Entered By: CVELFYBO,FBPZ D Comment: colonosocpy 7/12 normal per pt, 5 yr fu hx of polyps  . Shoulder pain 01/24/2021  . 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 Urethritis 09/21/2014  . Vitamin D deficiency 09/26/2015   Past Surgical History: Past Surgical History:  Procedure Laterality Date  .  BUNION CORRECTION Right  great toe  . CATARACT EXTRACTION Bilateral  . CHOLECYSTECTOMY  . CHOLECYSTECTOMY  . FUNCTIONAL ENDOSCOPIC SINUS SURGERY  . HERNIA REPAIR Bilateral  inguinal (right x2 & left x1)  . INSERTION PENILE PROSTHESIS MULTICOMPONENT N/A 12/17/2013  Procedure: Insertion of a three-piece penile implant ; Surgeon: Hillery Hunter, MD; Location: [facility]; Service: Urology; Laterality: N/A;  .  Right unicondylar knee arthroplasty. Right 01/10/2021  Dr. Roland Rack  . SHOULDER SURGERY Left  X2  . SKIN BIOPSY  basal cell skin Ca removed  . TONSILLECTOMY   Past Family History: History reviewed. No pertinent family history.  Medications: Current Outpatient Medications Ordered in Epic  Medication Sig Dispense Refill  . allopurinoL (ZYLOPRIM) 300 MG tablet Take 300 mg by mouth once daily  . arginine HCl, L-arginine, 1,000 mg Tab Take by mouth Take 1,000 mg by mouth daily.  . baclofen (LIORESAL) 10 MG tablet TAKE 1 TABLET BY MOUTH TWICE A DAY 60 tablet 0  . cetirizine (ZYRTEC) 10 MG tablet Take 10 mg by mouth once daily  . cholecalciferol, vitamin D3, (VITAMIN D3) 125 mcg (5,000 unit) tablet Take 5,000 Units by mouth once daily  . cimetidine (TAGAMET) 200 MG tablet Take by mouth Take 200 mg by mouth daily as needed (heartburn).  Marland Kitchen co-enzyme Q-10, ubiquinone, 100 mg capsule Take 100 mg by mouth once daily  . COLLAGEN MISC Take 1 capsule by mouth once daily  . cyanocobalamin (VITAMIN B12) 1000 MCG tablet Take by mouth Take 1,000 mcg by mouth daily.  . famotidine (PEPCID) 20 MG tablet Take by mouth Take 20 mg by mouth daily as needed for heartburn or indigestion.  . fluocinolone acetonide (DERMOTIC) 0.01 % otic drop Apply topically Apply 1 application topically See admin instructions. 1 application in the ears once or twice weekly  . fluticasone propionate (FLONASE) 50 mcg/actuation nasal spray Place into one nostril Place 1 spray into both nostrils as needed for allergies or rhinitis.  Marland Kitchen HYDROcodone-acetaminophen (NORCO) 5-325 mg tablet Take 1 tablet by mouth every 6 (six) hours as needed for Pain for up to 20 doses 20 tablet 0  . ibuprofen (MOTRIN) 200 MG tablet Take by mouth Take 200 mg by mouth every 6 (six) hours as needed for moderate pain.  Marland Kitchen lifitegrast (XIIDRA) 5 % ophthalmic solution Apply 1 drop to eye 2 (two) times daily  . maca extract 500 mg Cap Take 1 capsule by mouth as needed   . multivitamin tablet Take 1 tablet by mouth once daily  . niacin 500 MG tablet Take by mouth Take 500 mg by mouth at bedtime.  . tamsulosin (FLOMAX) 0.4 mg capsule Take 0.4 mg by mouth once daily  . tiZANidine (ZANAFLEX) 4 MG tablet Take 1 tablet (4 mg total) by mouth 3 (three) times daily as needed for Muscle spasms 30 tablet 1  . UNABLE TO FIND Niagen 300mg  1 capsule daily  . UNABLE TO FIND 7- Keto Lean 1 capsule on Monday, Wednesday and Friday  . UNABLE TO FIND Pygeum Prostate Heath 1 capsule daily  . vit A/vit C/vit E/zinc/copper (PRESERVISION AREDS ORAL) Take 1 capsule by mouth 2 (two) times daily  . diclofenac (SOLARAZE) 3 % topical gel Apply 1 Application topically 2 (two) times daily To head, neck, nose (Patient not taking: Reported on 03/31/2021)  . doxycycline (VIBRA-TABS) 100 MG tablet Take 100 mg by mouth 2 (two) times daily (Patient not taking: Reported on 03/31/2021)  . lutein 20 mg Cap Take 1  capsule by mouth once daily (Patient not taking: Reported on 03/31/2021)  . propylene glycol (SYSTANE BALANCE OPHTH) Apply 1 drop to eye as needed (Patient not taking: Reported on 03/31/2021)  . tadalafil (CIALIS) 5 MG tablet Take 5 mg by mouth every morning. (Patient not taking: Reported on 03/31/2021)   No current Epic-ordered facility-administered medications on file.   Allergies: Allergies  Allergen Reactions  . Sulfa (Sulfonamide Antibiotics) Shortness Of Breath, Palpitations and Rash  . Tetracyclines Shortness Of Breath and Palpitations  . Shellfish Derived Other (See Comments)  Increases gout symptoms  . Statins-Hmg-Coa Reductase Inhibitors Muscle Pain    Body mass index is 31.7 kg/m.  Review of Systems: A comprehensive 14 point ROS was performed, reviewed, and the pertinent orthopaedic findings are documented in the HPI.  Vitals:  03/31/21 0805  BP: (!) 162/74    General Physical Examination:   General/Constitutional: No apparent distress: well-nourished and well  developed. Eyes: Pupils equal, round with synchronous movement. Lungs: Clear to auscultation HEENT: Normal Vascular: No edema, swelling or tenderness, except as noted in detailed exam. Cardiac: Heart rate and rhythm is regular. Integumentary: No impressive skin lesions present, except as noted in detailed exam. Neuro/Psych: Normal mood and affect, oriented to person, place and time.  Musculoskeletal Examination:  On exam, tenderness at L1 which radiates to the side and down to the buttock with slight palpable kyphotic deformity. Good bilateral leg raise.   Radiographs:  No new imaging studies were obtained today.  Assessment: ICD-10-CM  1. Closed compression fracture of body of L1 vertebra (CMS-HCC) S32.010A   Plan:  The patient has clinical findings of unrelenting pain with L1 compression fracture from over 1 month ago related to a fall.  We discussed the patient's prior x-ray findings. He has significant L1 compression and pain, not adequately relieved with narcotics. We will plan for L1 kyphoplasty.  Surgical Risks:  The nature of the condition and the proposed procedure has been reviewed in detail with the patient. Surgical versus non-surgical options and prognosis for recovery have been reviewed and the inherent risks and benefits of each have been discussed including the risks of infection, bleeding, injury to nerves/blood vessels/tendons, incomplete relief of symptoms, persisting pain and/or stiffness, loss of function, complex regional pain syndrome, failure of the procedure, as appropriate.  Teeth: Nothing loose or removable  Attestation: I, Dawn Royse, am documenting for [facility], MD utilizing [place].    Electronically signed by Lauris Poag, MD at 03/31/2021 1:05 PM EDT  Reviewed  H+P. No changes noted.

## 2021-04-03 NOTE — Anesthesia Preprocedure Evaluation (Signed)
Anesthesia Evaluation  Patient identified by MRN, date of birth, ID band Patient awake    Reviewed: Allergy & Precautions, NPO status , Patient's Chart, lab work & pertinent test results  Airway Mallampati: II  TM Distance: >3 FB     Dental   Pulmonary former smoker,    Pulmonary exam normal        Cardiovascular negative cardio ROS Normal cardiovascular exam     Neuro/Psych negative neurological ROS  negative psych ROS   GI/Hepatic Neg liver ROS, IBS   Endo/Other  negative endocrine ROS  Renal/GU negative Renal ROS  negative genitourinary   Musculoskeletal  (+) Arthritis ,   Abdominal Normal abdominal exam  (+)   Peds negative pediatric ROS (+)  Hematology negative hematology ROS (+)   Anesthesia Other Findings Past Medical History: No date: BPH (benign prostatic hypertrophy) No date: Colon polyp No date: Elevated red blood cell count No date: Erectile dysfunction No date: Gout No date: History of chicken pox No date: History of measles No date: History of mumps No date: IBS (irritable bowel syndrome) 2000: MRSA infection     Comment:  nasal 2021: Skin cancer (melanoma) (HCC)     Comment:  left arm  Reproductive/Obstetrics                             Anesthesia Physical  Anesthesia Plan  ASA: II  Anesthesia Plan: General   Post-op Pain Management:    Induction: Intravenous  PONV Risk Score and Plan: Propofol infusion and TIVA  Airway Management Planned: Nasal Cannula  Additional Equipment:   Intra-op Plan:   Post-operative Plan:   Informed Consent: I have reviewed the patients History and Physical, chart, labs and discussed the procedure including the risks, benefits and alternatives for the proposed anesthesia with the patient or authorized representative who has indicated his/her understanding and acceptance.     Dental advisory given  Plan Discussed with:  CRNA and Surgeon  Anesthesia Plan Comments:         Anesthesia Quick Evaluation

## 2021-04-03 NOTE — Transfer of Care (Signed)
Immediate Anesthesia Transfer of Care Note  Patient: Larry Crawford  Procedure(s) Performed: L1 KYPHOPLASTY AND BONE BIOPDY (N/A )  Patient Location: PACU  Anesthesia Type:MAC  Level of Consciousness: awake and patient cooperative  Airway & Oxygen Therapy: Patient Spontanous Breathing  Post-op Assessment: Report given to RN and Post -op Vital signs reviewed and stable  Post vital signs: Reviewed and stable  Last Vitals:  Vitals Value Taken Time  BP 109/57 04/03/21 1330  Temp    Pulse 71 04/03/21 1334  Resp 16 04/03/21 1334  SpO2 96 % 04/03/21 1334  Vitals shown include unvalidated device data.  Last Pain:  Vitals:   04/03/21 1058  TempSrc: Temporal  PainSc: 2          Complications: No complications documented.

## 2021-04-03 NOTE — Anesthesia Postprocedure Evaluation (Signed)
Anesthesia Post Note  Patient: Larry Crawford  Procedure(s) Performed: L1 KYPHOPLASTY AND BONE BIOPDY (N/A )  Patient location during evaluation: PACU Anesthesia Type: MAC Level of consciousness: awake and alert and oriented Pain management: pain level controlled Vital Signs Assessment: post-procedure vital signs reviewed and stable Respiratory status: spontaneous breathing Cardiovascular status: blood pressure returned to baseline Anesthetic complications: no   No complications documented.   Last Vitals:  Vitals:   04/03/21 1400 04/03/21 1415  BP: (!) 143/70 (!) 141/69  Pulse: 65 65  Resp: 15   Temp:  (!) 36.4 C  SpO2: 97% 100%    Last Pain:  Vitals:   04/03/21 1415  TempSrc: Temporal  PainSc: 0-No pain                 [name]

## 2021-04-04 ENCOUNTER — Encounter: Payer: Self-pay | Admitting: Orthopedic Surgery

## 2021-04-04 LAB — SURGICAL PATHOLOGY

## 2021-04-05 NOTE — Progress Notes (Signed)
Larry Crawford is day 2 s/p L1  Kyphoplasty and Bone Biopdy, patient at approximately called Post OP at approximately 0800 to report that one of the incisions to his back is painful and per his wife looks infected. This Probation officer informed patient to call Dr. Rudene Christians office this am and let them know.

## 2021-04-19 DIAGNOSIS — S32010A Wedge compression fracture of first lumbar vertebra, initial encounter for closed fracture: Secondary | ICD-10-CM | POA: Diagnosis not present

## 2021-05-23 ENCOUNTER — Ambulatory Visit: Payer: Self-pay | Admitting: *Deleted

## 2021-05-23 DIAGNOSIS — R0602 Shortness of breath: Secondary | ICD-10-CM | POA: Diagnosis not present

## 2021-05-23 DIAGNOSIS — R0989 Other specified symptoms and signs involving the circulatory and respiratory systems: Secondary | ICD-10-CM | POA: Diagnosis not present

## 2021-05-23 NOTE — Telephone Encounter (Signed)
Pt calling in c/o shortness of breath, coughing up green mucus and his chest rattling when he is laying down since Friday.   His pulse ox is 93% this morning and it's normally 98% on room air.   He bought the pulse ox during Covid just to monitor himself.   Denies any underlying lung or heart issues.  See triage notes.  There are no appt s available with [facility] and his provider Dr. Caryn Section is on vacation.    I'm sending a note for them regarding an appt high priority.    He was agreeable to someone calling him back regarding an appt.

## 2021-05-23 NOTE — Telephone Encounter (Signed)
Reason for Disposition  Oxygen level (e.g., pulse oximetry) 91 to 94 percent    Pulse ox 93% this morning and it's normally 98%.   Rattling in chest and coughing up green mucus since Friday.  Answer Assessment - Initial Assessment Questions 1. RESPIRATORY STATUS: "Describe your breathing?" (e.g., wheezing, shortness of breath, unable to speak, severe coughing)      Pulse Ox 93% on room air.  Feeling rattling in chest when lay down.   It's normally 98% pulse ox.   2. ONSET: "When did this breathing problem begin?"      Friday 3. PATTERN "Does the difficult breathing come and go, or has it been constant since it started?"      Intermittent 4. SEVERITY: "How bad is your breathing?" (e.g., mild, moderate, severe)    - MILD: No SOB at rest, mild SOB with walking, speaks normally in sentences, can lie down, no retractions, pulse < 100.    - MODERATE: SOB at rest, SOB with minimal exertion and prefers to sit, cannot lie down flat, speaks in phrases, mild retractions, audible wheezing, pulse 100-120.    - SEVERE: Very SOB at rest, speaks in single words, struggling to breathe, sitting hunched forward, retractions, pulse > 120      Coughing up big clobs of green and yellow.    No fever 97.8 this morning    Not tested for Covid.    5. RECURRENT SYMPTOM: "Have you had difficulty breathing before?" If Yes, ask: "When was the last time?" and "What happened that time?"      I'm having a little shortness of breath and dizziness.   This all started last Friday.    No sick exposures. 6. CARDIAC HISTORY: "Do you have any history of heart disease?" (e.g., heart attack, angina, bypass surgery, angioplasty)      No 7. LUNG HISTORY: "Do you have any history of lung disease?"  (e.g., pulmonary embolus, asthma, emphysema)     None of the above.   I bought the pulse ox during Covid 8. CAUSE: "What do you think is causing the breathing problem?"      I think it's my sinus.  That seems to be where all this started.    9. OTHER SYMPTOMS: "Do you have any other symptoms? (e.g., dizziness, runny nose, cough, chest pain, fever)     Dizziness, runny nose, rattling in chest when laying down. 10. O2 SATURATION MONITOR:  "Do you use an oxygen saturation monitor (pulse oximeter) at home?" If Yes, "What is your reading (oxygen level) today?" "What is your usual oxygen saturation reading?" (e.g., 95%)       93% this morning normally 98%   room air 11. PREGNANCY: "Is there any chance you are pregnant?" "When was your last menstrual period?"       N/A 12. TRAVEL: "Have you traveled out of the country in the last month?" (e.g., travel history, exposures)       No  Protocols used: Breathing Difficulty-A-AH

## 2021-05-23 NOTE — Telephone Encounter (Signed)
Advised pt that we do not have appointment available today.  He states is he going to go to an Urgent Care.    Thanks,   -Mickel Baas

## 2021-05-26 DIAGNOSIS — R351 Nocturia: Secondary | ICD-10-CM | POA: Diagnosis not present

## 2021-05-26 DIAGNOSIS — R3912 Poor urinary stream: Secondary | ICD-10-CM | POA: Diagnosis not present

## 2021-05-26 DIAGNOSIS — N401 Enlarged prostate with lower urinary tract symptoms: Secondary | ICD-10-CM | POA: Diagnosis not present

## 2021-05-28 DIAGNOSIS — Z20822 Contact with and (suspected) exposure to covid-19: Secondary | ICD-10-CM | POA: Diagnosis not present

## 2021-06-07 DIAGNOSIS — J019 Acute sinusitis, unspecified: Secondary | ICD-10-CM | POA: Diagnosis not present

## 2021-06-07 DIAGNOSIS — J324 Chronic pansinusitis: Secondary | ICD-10-CM | POA: Diagnosis not present

## 2021-07-04 DIAGNOSIS — L570 Actinic keratosis: Secondary | ICD-10-CM | POA: Diagnosis not present

## 2021-07-04 DIAGNOSIS — D485 Neoplasm of uncertain behavior of skin: Secondary | ICD-10-CM | POA: Diagnosis not present

## 2021-07-04 DIAGNOSIS — L218 Other seborrheic dermatitis: Secondary | ICD-10-CM | POA: Diagnosis not present

## 2021-07-04 DIAGNOSIS — L820 Inflamed seborrheic keratosis: Secondary | ICD-10-CM | POA: Diagnosis not present

## 2021-07-06 DIAGNOSIS — M47816 Spondylosis without myelopathy or radiculopathy, lumbar region: Secondary | ICD-10-CM | POA: Diagnosis not present

## 2021-07-10 DIAGNOSIS — M1711 Unilateral primary osteoarthritis, right knee: Secondary | ICD-10-CM | POA: Diagnosis not present

## 2021-07-10 DIAGNOSIS — Z96651 Presence of right artificial knee joint: Secondary | ICD-10-CM | POA: Diagnosis not present

## 2021-07-10 DIAGNOSIS — J301 Allergic rhinitis due to pollen: Secondary | ICD-10-CM | POA: Diagnosis not present

## 2021-07-25 DIAGNOSIS — M47816 Spondylosis without myelopathy or radiculopathy, lumbar region: Secondary | ICD-10-CM | POA: Diagnosis not present

## 2021-08-09 DIAGNOSIS — M47816 Spondylosis without myelopathy or radiculopathy, lumbar region: Secondary | ICD-10-CM | POA: Diagnosis not present

## 2021-08-17 DIAGNOSIS — N401 Enlarged prostate with lower urinary tract symptoms: Secondary | ICD-10-CM | POA: Diagnosis not present

## 2021-08-17 DIAGNOSIS — R350 Frequency of micturition: Secondary | ICD-10-CM | POA: Diagnosis not present

## 2021-08-17 DIAGNOSIS — R351 Nocturia: Secondary | ICD-10-CM | POA: Diagnosis not present

## 2021-08-17 DIAGNOSIS — N411 Chronic prostatitis: Secondary | ICD-10-CM | POA: Diagnosis not present

## 2021-08-21 ENCOUNTER — Other Ambulatory Visit: Payer: Self-pay | Admitting: Family Medicine

## 2021-08-21 ENCOUNTER — Telehealth: Payer: Self-pay | Admitting: Family Medicine

## 2021-08-21 DIAGNOSIS — N529 Male erectile dysfunction, unspecified: Secondary | ICD-10-CM

## 2021-08-21 DIAGNOSIS — N400 Enlarged prostate without lower urinary tract symptoms: Secondary | ICD-10-CM

## 2021-08-21 NOTE — Telephone Encounter (Signed)
Pt is calling to schedule a CPE. Schedule was unavailable for AWV with Dr. Caryn Section. Please advise   CB- [phone]

## 2021-09-01 ENCOUNTER — Other Ambulatory Visit: Payer: Self-pay

## 2021-09-01 MED ORDER — FLUAD QUADRIVALENT 0.5 ML IM PRSY
PREFILLED_SYRINGE | INTRAMUSCULAR | 0 refills | Status: DC
  Filled 2021-09-01: qty 0.5, 1d supply, fill #0

## 2021-09-13 DIAGNOSIS — L814 Other melanin hyperpigmentation: Secondary | ICD-10-CM | POA: Diagnosis not present

## 2021-09-13 DIAGNOSIS — L821 Other seborrheic keratosis: Secondary | ICD-10-CM | POA: Diagnosis not present

## 2021-09-13 DIAGNOSIS — L570 Actinic keratosis: Secondary | ICD-10-CM | POA: Diagnosis not present

## 2021-09-13 DIAGNOSIS — M47817 Spondylosis without myelopathy or radiculopathy, lumbosacral region: Secondary | ICD-10-CM | POA: Diagnosis not present

## 2021-09-13 DIAGNOSIS — L819 Disorder of pigmentation, unspecified: Secondary | ICD-10-CM | POA: Diagnosis not present

## 2021-09-13 DIAGNOSIS — Z85820 Personal history of malignant melanoma of skin: Secondary | ICD-10-CM | POA: Diagnosis not present

## 2021-09-13 DIAGNOSIS — D225 Melanocytic nevi of trunk: Secondary | ICD-10-CM | POA: Diagnosis not present

## 2021-09-13 DIAGNOSIS — Z08 Encounter for follow-up examination after completed treatment for malignant neoplasm: Secondary | ICD-10-CM | POA: Diagnosis not present

## 2021-10-02 ENCOUNTER — Encounter: Payer: Medicare Other | Admitting: Physician Assistant

## 2021-10-05 ENCOUNTER — Encounter: Payer: Medicare Other | Admitting: Physician Assistant

## 2021-10-09 ENCOUNTER — Other Ambulatory Visit: Payer: Self-pay

## 2021-10-09 ENCOUNTER — Encounter: Payer: Self-pay | Admitting: Physician Assistant

## 2021-10-09 ENCOUNTER — Ambulatory Visit (INDEPENDENT_AMBULATORY_CARE_PROVIDER_SITE_OTHER): Payer: Medicare Other | Admitting: Physician Assistant

## 2021-10-09 VITALS — BP 133/65 | HR 63 | Ht 68.0 in | Wt 184.7 lb

## 2021-10-09 DIAGNOSIS — R739 Hyperglycemia, unspecified: Secondary | ICD-10-CM

## 2021-10-09 DIAGNOSIS — N400 Enlarged prostate without lower urinary tract symptoms: Secondary | ICD-10-CM | POA: Diagnosis not present

## 2021-10-09 DIAGNOSIS — E782 Mixed hyperlipidemia: Secondary | ICD-10-CM | POA: Diagnosis not present

## 2021-10-09 DIAGNOSIS — Z0000 Encounter for general adult medical examination without abnormal findings: Secondary | ICD-10-CM

## 2021-10-09 NOTE — Progress Notes (Signed)
Annual Wellness Visit     Patient: Larry Crawford, Male    DOB: [birth year], 80 y.o.   MRN: [id number] Visit Date: 10/09/2021  Today's Provider: Mikey Kirschner, PA-C   Cc. Annual wellness visit   Subjective    Larry Crawford is a 80 y.o. male who presents today for his Annual Wellness Visit. He reports consuming a general and low fat diet. The patient does not participate in regular exercise at present. Goes walking sometimes  He generally feels well. He reports sleeping fairly well. He does not have additional problems to discuss today.   Being treated for what sounded like otitis media with per patient ruptured left eardrum.  Took a course of Augmentin and is using eardrops.  He still does not have full hearing on his left side.  Denies any current ear pain.   Medications: Outpatient Medications Prior to Visit  Medication Sig   allopurinol (ZYLOPRIM) 300 MG tablet TAKE 1 TABLET DAILY (Patient taking differently: Take 300 mg by mouth in the morning.)   cetirizine (ZYRTEC) 10 MG tablet Take 10 mg by mouth in the morning.   cimetidine (TAGAMET) 200 MG tablet Take 200 mg by mouth daily as needed (heartburn).   Coenzyme Q10 (COQ10) 100 MG CAPS Take 100 mg by mouth in the morning.   Cyanocobalamin (VITAMIN B-12) 5000 MCG SUBL Place 5,000 mcg under the tongue in the morning.   DERMOTIC 0.01 % OIL Apply 1 application topically See admin instructions. 1 application in the ears once or twice weekly   famotidine (PEPCID) 20 MG tablet Take 20 mg by mouth daily as needed for heartburn or indigestion.   fluticasone (FLONASE) 50 MCG/ACT nasal spray Place 1 spray into both nostrils daily as needed for allergies or rhinitis.   HYDROcodone-acetaminophen (NORCO) 5-325 MG tablet Take 1 tablet by mouth every 6 (six) hours as needed for moderate pain.   influenza vaccine adjuvanted (FLUAD QUADRIVALENT) 0.5 ML injection Inject into the muscle.   L-Arginine 1000 MG TABS Take 1,000 mg by mouth 3  (three) times a week.   Lifitegrast (XIIDRA) 5 % SOLN Place 1 drop into both eyes 2 (two) times daily.   Lutein 20 MG CAPS Take 20 mg by mouth daily.   Multiple Vitamin (MULTIVITAMIN WITH MINERALS) TABS tablet Take 1 tablet by mouth in the morning.   Multiple Vitamins-Minerals (PRESERVISION AREDS 2 PO) Take 1 tablet by mouth in the morning.   niacin 500 MG tablet Take 500 mg by mouth in the morning.   tadalafil (CIALIS) 5 MG tablet Take 1 tablet (5 mg total) by mouth in the morning.   No facility-administered medications prior to visit.    Allergies  Allergen Reactions   Tetracyclines & Related Shortness Of Breath and Rash   Colestipol Hcl Diarrhea    Fatigue   Corn-Containing Products     Diarrhea and "makes my insides hurt"    Pravastatin Sodium     Muscle Pain   Shellfish Allergy     Causes gout    Sulfa Antibiotics Hives   Welchol [Colesevelam]     Stomach pain   Zetia [Ezetimibe]     Didn't feel good when he was taking it.     Patient Care Team: Birdie Sons, MD as PCP - General (Family Medicine) Lorelee Cover., MD as Consulting Physician (Ophthalmology) Lucas Mallow, MD as Consulting Physician (Urology) Poggi, Marshall Cork, MD as Consulting Physician (Orthopedic Surgery)  Review of  Systems  HENT:  Positive for ear discharge, hearing loss and tinnitus.   Eyes:  Positive for itching.  Respiratory:  Positive for wheezing.   Gastrointestinal:  Positive for diarrhea.  Endocrine: Positive for polyuria.  Genitourinary:  Positive for frequency and urgency.  Musculoskeletal:  Positive for back pain and neck stiffness.  All other systems reviewed and are negative.  Last CBC Lab Results  Component Value Date   WBC 7.2 01/03/2021   HGB 13.6 01/03/2021   HCT 40.6 01/03/2021   MCV 91.2 01/03/2021   MCH 30.6 01/03/2021   RDW 13.3 01/03/2021   PLT 402 (H) 16/08/9603   Last metabolic panel Lab Results  Component Value Date   GLUCOSE 115 (H) 01/03/2021   NA 135  01/03/2021   K 4.1 01/03/2021   CL 100 01/03/2021   CO2 25 01/03/2021   BUN 19 01/03/2021   CREATININE 1.05 01/03/2021   GFRNONAA >60 01/03/2021   CALCIUM 9.8 01/03/2021   PROT 7.1 01/03/2021   ALBUMIN 4.3 01/03/2021   LABGLOB 2.2 11/23/2020   AGRATIO 2.1 11/23/2020   BILITOT 0.6 01/03/2021   ALKPHOS 60 01/03/2021   AST 19 01/03/2021   ALT 18 01/03/2021   ANIONGAP 10 01/03/2021   Last lipids Lab Results  Component Value Date   CHOL 289 (H) 11/23/2020   HDL 45 11/23/2020   LDLCALC 194 (H) 11/23/2020   TRIG 258 (H) 11/23/2020   CHOLHDL 6.4 (H) 11/23/2020   Last hemoglobin A1c Lab Results  Component Value Date   HGBA1C 5.9 (H) 01/15/2018   Last thyroid functions Lab Results  Component Value Date   TSH 1.950 12/13/2016   T3TOTAL 89 10/04/2015   Last vitamin D Lab Results  Component Value Date   VD25OH 44.0 11/23/2020   Last vitamin B12 and Folate No results found for: VITAMINB12, FOLATE      Objective    Vitals: BP 133/65 (BP Location: Right Arm, Patient Position: Sitting, Cuff Size: Large)   Pulse 63   Ht 5\' 8"  (1.727 m)   Wt 184 lb 11.2 oz (83.8 kg)   SpO2 99%   BMI 28.08 kg/m  BP Readings from Last 3 Encounters:  10/09/21 133/65  04/03/21 (!) 141/69  01/10/21 (!) 144/77   Wt Readings from Last 3 Encounters:  10/09/21 184 lb 11.2 oz (83.8 kg)  04/03/21 195 lb (88.5 kg)  01/10/21 190 lb (86.2 kg)      Physical Exam Constitutional:      General: He is awake.     Appearance: He is well-developed.  HENT:     Head: Normocephalic.     Right Ear: Tympanic membrane, ear canal and external ear normal.     Left Ear: Tympanic membrane, ear canal and external ear normal.     Nose: Nose normal. No congestion or rhinorrhea.     Mouth/Throat:     Mouth: Mucous membranes are moist.     Pharynx: No oropharyngeal exudate or posterior oropharyngeal erythema.  Eyes:     Pupils: Pupils are equal, round, and reactive to light.  Cardiovascular:     Rate and  Rhythm: Normal rate and regular rhythm.     Heart sounds: Normal heart sounds.  Pulmonary:     Effort: Pulmonary effort is normal.     Breath sounds: Normal breath sounds.  Abdominal:     General: There is no distension.     Palpations: Abdomen is soft.     Tenderness: There is no abdominal tenderness.  There is no guarding.  Musculoskeletal:     Cervical back: Normal range of motion.     Right lower leg: No edema.     Left lower leg: No edema.  Lymphadenopathy:     Cervical: No cervical adenopathy.  Skin:    General: Skin is warm.  Neurological:     Mental Status: He is alert and oriented to person, place, and time.  Psychiatric:        Attention and Perception: Attention normal.        Mood and Affect: Mood normal.        Speech: Speech normal.        Behavior: Behavior normal. Behavior is cooperative.    Most recent functional status assessment: In your present state of health, do you have any difficulty performing the following activities: 10/09/2021  Hearing? Y  Vision? N  Difficulty concentrating or making decisions? N  Walking or climbing stairs? N  Dressing or bathing? N  Doing errands, shopping? N  Preparing Food and eating ? -  Using the Toilet? -  In the past six months, have you accidently leaked urine? -  Do you have problems with loss of bowel control? -  Managing your Medications? -  Managing your Finances? -  Housekeeping or managing your Housekeeping? -  Some recent data might be hidden   Most recent fall risk assessment: Fall Risk  10/09/2021  Falls in the past year? 1  Number falls in past yr: 0  Injury with Fall? 1  Risk for fall due to : History of fall(s)  Follow up -    Most recent depression screenings: PHQ 2/9 Scores 10/09/2021 11/23/2020  PHQ - 2 Score 2 1  PHQ- 9 Score 8 8   Most recent cognitive screening: 6CIT Screen 11/20/2016  What Year? 0 points  What month? 0 points  What time? 0 points  Count back from 20 0 points  Months in  reverse 0 points  Repeat phrase 2 points  Total Score 2   Most recent Audit-C alcohol use screening Alcohol Use Disorder Test (AUDIT) 10/09/2021  1. How often do you have a drink containing alcohol? 2  2. How many drinks containing alcohol do you have on a typical day when you are drinking? 0  3. How often do you have six or more drinks on one occasion? 0  AUDIT-C Score 2  Alcohol Brief Interventions/Follow-up -   A score of 3 or more in women, and 4 or more in men indicates increased risk for alcohol abuse, EXCEPT if all of the points are from question 1   No results found for any visits on 10/09/21.  Assessment & Plan     Annual wellness visit done today including the all of the following: Reviewed patient's Family Medical History Reviewed and updated list of patient's medical providers Assessment of cognitive impairment was done Assessed patient's functional ability Established a written schedule for health screening Cataract Completed and Reviewed  Exercise Activities and Dietary recommendations  Goals       DIET - REDUCE CALORIE INTAKE      Pt to start dieting and go on the HCG diet and lower calorie intake in daily diet.       I havent seen Dr. Caryn Section in over a year (pt-stated)      Current Barriers:  Knowledge Deficits related to importance of yearly physicals and follow ups with primary providers Fear of COVID-19  Nurse Case  Manager Clinical Goal(s):  Over the next 7 days, patient will demonstrate improved health management independence as evidenced by calling to schedule yearly physical Over the next 90 days, patient will complete yearly physical and AWV  Interventions:  Reviewed chart for last PCP visit note Advised patient to call PCP office ASAP and schedule his AWV and yearly physical with Dr. Caryn Section (scheduled for 08/25/2019 at 9:00) Reviewed medications with patient and discussed compliance Provided emotional support and reassurance  regarding Covid 19 and safety of MD office in midst of pandemic Discussed plans with patient for ongoing care management follow up once he has been seen by PCP and provided patient with direct contact information for care management team  Patient Self Care Activities:  Self administers medications as prescribed Attends all scheduled provider appointments  Initial goal documentation         Immunization History  Administered Date(s) Administered   Fluad Quad(high Dose 65+) 08/05/2019, 09/01/2021   Influenza, High Dose Seasonal PF 09/28/2015, 07/27/2017, 08/14/2018, 08/18/2020   Influenza-Unspecified 09/01/2021   PFIZER(Purple Top)SARS-COV-2 Vaccination 11/23/2019, 12/14/2019, 08/22/2020   Pneumococcal Conjugate-13 05/11/2014   Pneumococcal Polysaccharide-23 07/29/2005   Rabies, IM 12/27/2017, 12/30/2017, 01/03/2018, 01/10/2018   Tdap 08/25/2014, 12/27/2017   Zoster, Live 02/05/2013    Health Maintenance  Topic Date Due   Zoster Vaccines- Shingrix (1 of 2) Never done   COVID-19 Vaccine (4 - Booster for Pfizer series) 10/17/2020   TETANUS/TDAP  12/28/2027   Pneumonia Vaccine 90+ Years old  Completed   INFLUENZA VACCINE  Completed   HPV VACCINES  Aged Out   COLONOSCOPY (Pts 45-40yrs Insurance coverage will need to be confirmed)  Discontinued     Discussed health benefits of physical activity, and encouraged him to engage in regular exercise appropriate for his age and condition.    Problem List Items Addressed This Visit       Genitourinary   BPH (benign prostatic hyperplasia)    Being followed by urology.  No additional complaints today.        Other   Mixed hyperlipidemia    History of intolerance to pravastatin and colestid.  Only taking niacin currently.  Lipid panel / cmp ordered      Relevant Orders   Lipid Profile   Hyperglycemia    Historically, will check fasting cmp and a1c today      Relevant Orders   Comprehensive Metabolic Panel (CMET)   HgB  A1c   Other Visit Diagnoses     Encounter for physical examination    -  Primary     Regarding L hearing changes/ruptured TM --All appears normal on exam today, encouraged pt to f/u and have hearing tested   Return in about 6 months (around 04/08/2022) for hyperlipidemia.     I, Mikey Kirschner, PA-C have reviewed all documentation for this visit. The documentation on 10/09/2021 for the exam, diagnosis, procedures, and orders are all accurate and complete.    Mikey Kirschner, PA-C  [facility] [phone] (phone) [phone] (fax)  [place]

## 2021-10-09 NOTE — Assessment & Plan Note (Signed)
History of intolerance to pravastatin and colestid.  Only taking niacin currently.  Lipid panel / cmp ordered

## 2021-10-09 NOTE — Assessment & Plan Note (Signed)
Historically, will check fasting cmp and a1c today

## 2021-10-09 NOTE — Assessment & Plan Note (Signed)
Being followed by urology.  No additional complaints today.

## 2021-10-10 LAB — COMPREHENSIVE METABOLIC PANEL
ALT: 16 IU/L (ref 0–44)
AST: 18 IU/L (ref 0–40)
Albumin/Globulin Ratio: 2.4 — ABNORMAL HIGH (ref 1.2–2.2)
Albumin: 5 g/dL — ABNORMAL HIGH (ref 3.6–4.6)
Alkaline Phosphatase: 87 IU/L (ref 44–121)
BUN/Creatinine Ratio: 14 (ref 10–24)
BUN: 19 mg/dL (ref 8–27)
Bilirubin Total: 0.4 mg/dL (ref 0.0–1.2)
CO2: 21 mmol/L (ref 20–29)
Calcium: 10.2 mg/dL (ref 8.6–10.2)
Chloride: 101 mmol/L (ref 96–106)
Creatinine, Ser: 1.36 mg/dL — ABNORMAL HIGH (ref 0.76–1.27)
Globulin, Total: 2.1 g/dL (ref 1.5–4.5)
Glucose: 101 mg/dL — ABNORMAL HIGH (ref 70–99)
Potassium: 4.8 mmol/L (ref 3.5–5.2)
Sodium: 141 mmol/L (ref 134–144)
Total Protein: 7.1 g/dL (ref 6.0–8.5)
eGFR: 52 mL/min/{1.73_m2} — ABNORMAL LOW (ref >59)

## 2021-10-10 LAB — LIPID PANEL
Chol/HDL Ratio: 6.4 ratio — ABNORMAL HIGH (ref 0.0–5.0)
Cholesterol, Total: 262 mg/dL — ABNORMAL HIGH (ref 100–199)
HDL: 41 mg/dL (ref >39)
LDL Chol Calc (NIH): 190 mg/dL — ABNORMAL HIGH (ref 0–99)
Triglycerides: 165 mg/dL — ABNORMAL HIGH (ref 0–149)
VLDL Cholesterol Cal: 31 mg/dL (ref 5–40)

## 2021-10-10 LAB — HEMOGLOBIN A1C
Est. average glucose Bld gHb Est-mCnc: 117 mg/dL
Hgb A1c MFr Bld: 5.7 % — ABNORMAL HIGH (ref 4.8–5.6)

## 2021-10-18 DIAGNOSIS — N401 Enlarged prostate with lower urinary tract symptoms: Secondary | ICD-10-CM | POA: Diagnosis not present

## 2021-10-18 DIAGNOSIS — R350 Frequency of micturition: Secondary | ICD-10-CM | POA: Diagnosis not present

## 2021-10-18 DIAGNOSIS — R351 Nocturia: Secondary | ICD-10-CM | POA: Diagnosis not present

## 2021-10-18 DIAGNOSIS — R3915 Urgency of urination: Secondary | ICD-10-CM | POA: Diagnosis not present

## 2021-10-18 DIAGNOSIS — R3912 Poor urinary stream: Secondary | ICD-10-CM | POA: Diagnosis not present

## 2021-10-24 ENCOUNTER — Other Ambulatory Visit: Payer: Self-pay | Admitting: Family Medicine

## 2021-12-04 DIAGNOSIS — H353131 Nonexudative age-related macular degeneration, bilateral, early dry stage: Secondary | ICD-10-CM | POA: Diagnosis not present

## 2021-12-12 DIAGNOSIS — M5416 Radiculopathy, lumbar region: Secondary | ICD-10-CM | POA: Diagnosis not present

## 2021-12-12 DIAGNOSIS — M5136 Other intervertebral disc degeneration, lumbar region: Secondary | ICD-10-CM | POA: Diagnosis not present

## 2021-12-12 DIAGNOSIS — M47816 Spondylosis without myelopathy or radiculopathy, lumbar region: Secondary | ICD-10-CM | POA: Diagnosis not present

## 2021-12-12 DIAGNOSIS — M62830 Muscle spasm of back: Secondary | ICD-10-CM | POA: Diagnosis not present

## 2021-12-22 LAB — VITAMIN D 25 HYDROXY (VIT D DEFICIENCY, FRACTURES): Vit D, 25-Hydroxy: 60

## 2021-12-22 LAB — TSH: TSH: 2.21 (ref 0.41–5.90)

## 2021-12-25 DIAGNOSIS — M5459 Other low back pain: Secondary | ICD-10-CM | POA: Diagnosis not present

## 2021-12-29 DIAGNOSIS — M5459 Other low back pain: Secondary | ICD-10-CM | POA: Diagnosis not present

## 2022-01-01 DIAGNOSIS — M5459 Other low back pain: Secondary | ICD-10-CM | POA: Diagnosis not present

## 2022-01-03 DIAGNOSIS — M5459 Other low back pain: Secondary | ICD-10-CM | POA: Diagnosis not present

## 2022-01-11 DIAGNOSIS — M5459 Other low back pain: Secondary | ICD-10-CM | POA: Diagnosis not present

## 2022-01-15 DIAGNOSIS — M5459 Other low back pain: Secondary | ICD-10-CM | POA: Diagnosis not present

## 2022-01-18 DIAGNOSIS — M5459 Other low back pain: Secondary | ICD-10-CM | POA: Diagnosis not present

## 2022-01-22 DIAGNOSIS — M5459 Other low back pain: Secondary | ICD-10-CM | POA: Diagnosis not present

## 2022-01-23 ENCOUNTER — Other Ambulatory Visit: Payer: Self-pay | Admitting: Family Medicine

## 2022-01-23 DIAGNOSIS — M5136 Other intervertebral disc degeneration, lumbar region: Secondary | ICD-10-CM | POA: Diagnosis not present

## 2022-01-23 DIAGNOSIS — M48061 Spinal stenosis, lumbar region without neurogenic claudication: Secondary | ICD-10-CM | POA: Diagnosis not present

## 2022-01-23 DIAGNOSIS — M545 Low back pain, unspecified: Secondary | ICD-10-CM | POA: Diagnosis not present

## 2022-01-23 DIAGNOSIS — M5416 Radiculopathy, lumbar region: Secondary | ICD-10-CM | POA: Diagnosis not present

## 2022-01-23 DIAGNOSIS — S32030A Wedge compression fracture of third lumbar vertebra, initial encounter for closed fracture: Secondary | ICD-10-CM

## 2022-01-23 DIAGNOSIS — M5126 Other intervertebral disc displacement, lumbar region: Secondary | ICD-10-CM | POA: Diagnosis not present

## 2022-01-23 DIAGNOSIS — M62830 Muscle spasm of back: Secondary | ICD-10-CM | POA: Diagnosis not present

## 2022-01-24 ENCOUNTER — Ambulatory Visit
Admission: RE | Admit: 2022-01-24 | Discharge: 2022-01-24 | Disposition: A | Discharge status: Home / Self Care | Payer: Medicare Other | Source: Ambulatory Visit | Attending: Family Medicine | Admitting: Family Medicine

## 2022-01-24 DIAGNOSIS — S32030A Wedge compression fracture of third lumbar vertebra, initial encounter for closed fracture: Secondary | ICD-10-CM | POA: Diagnosis not present

## 2022-01-24 IMAGING — MR MR LUMBAR SPINE W/O CM
5 series · 30 of 48 positions shown · non-contrast
Comparison: Lumbar spine MRI [DATE]

CLINICAL DATA: L3 compression fracture. Low back and right leg
pain.

EXAM:
MRI LUMBAR SPINE WITHOUT CONTRAST
TECHNIQUE: Multiplanar, multisequence MR imaging of the lumbar spine was
performed. No intravenous contrast was administered.

[Series 5: T2 · sagittal · 4.0mm · 0.81mm/px · 6 of 17 slices shown (1 of 2)]
[im 1/17]
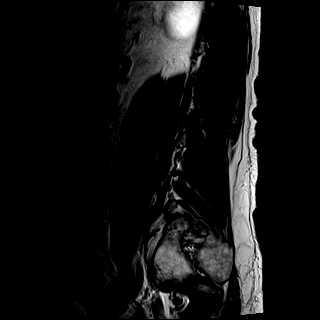
[im 4/17]
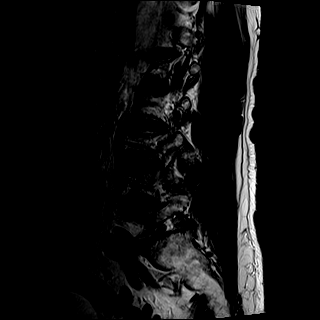
[im 7/17]
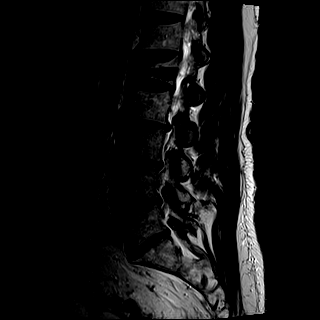
[im 10/17]
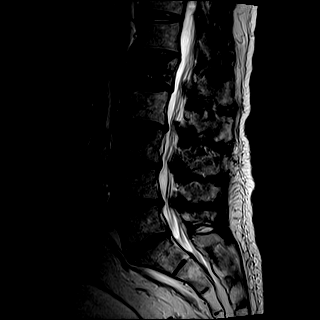
[im 13/17]
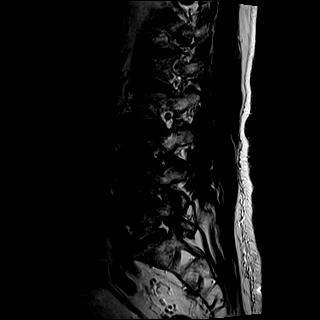
[im 17/17]
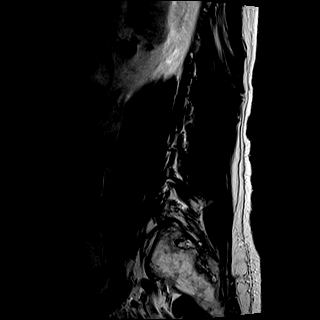

[Series 6: T1 · sagittal · 4.0mm · 0.81mm/px · 7 of 17 slices shown (1 of 2)]
[im 1/17]
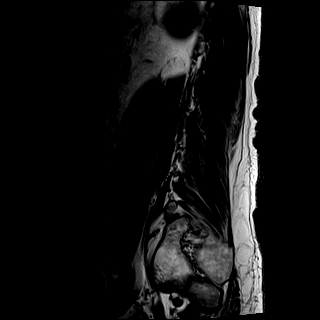
[im 3/17]
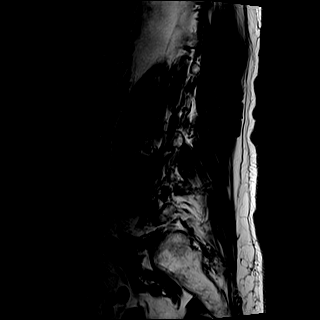
[im 6/17]
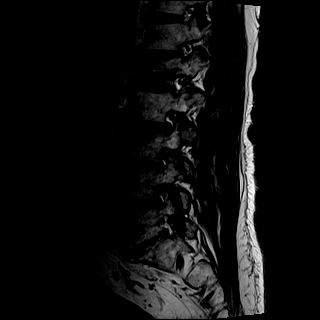
[im 9/17]
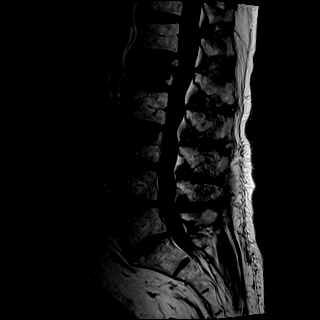
[im 11/17]
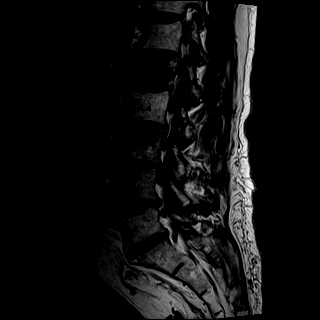
[im 14/17]
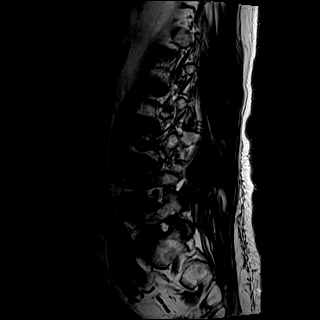
[im 17/17]
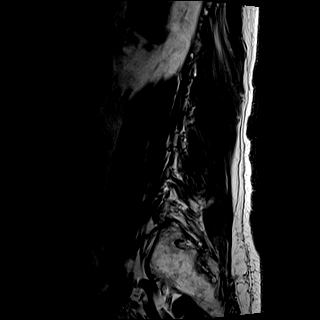

[Series 7: STIR · sagittal · 4.0mm · 0.41mm/px · 1 of 17 slices shown]
[im 1/17]
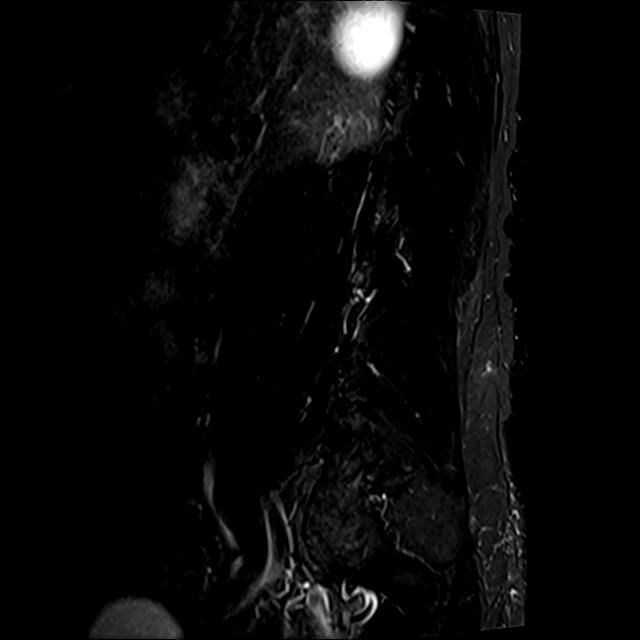

[Series 8: T2 · axial · 4.0mm · 0.78mm/px · z∈[-96,+112]mm · 8 of 36 slices shown (2 of 2)]
[im 1/36]
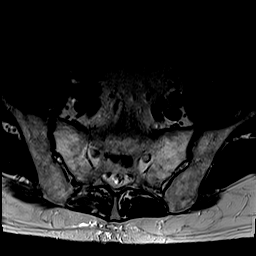
[im 6/36]
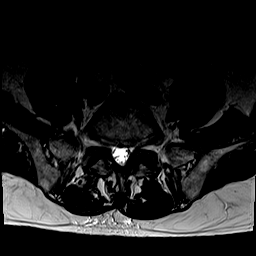
[im 11/36]
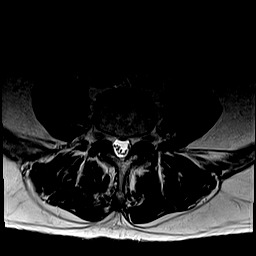
[im 17/36]
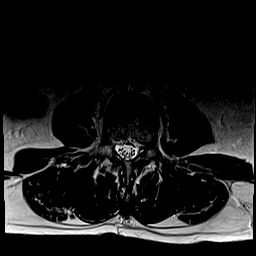
[im 19/36]
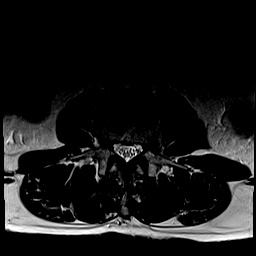
[im 25/36]
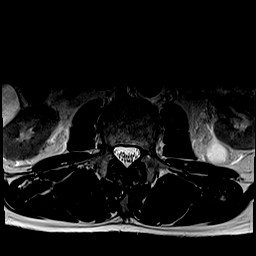
[im 30/36]
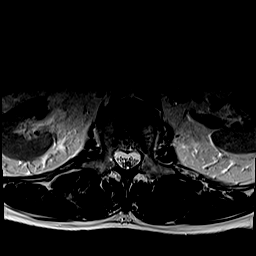
[im 36/36]
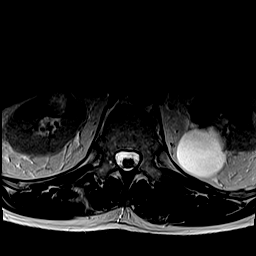

[Series 9: T1 · axial · 4.0mm · 0.39mm/px · z∈[-96,+112]mm · 8 of 36 slices shown (2 of 2)]
[im 1/36]
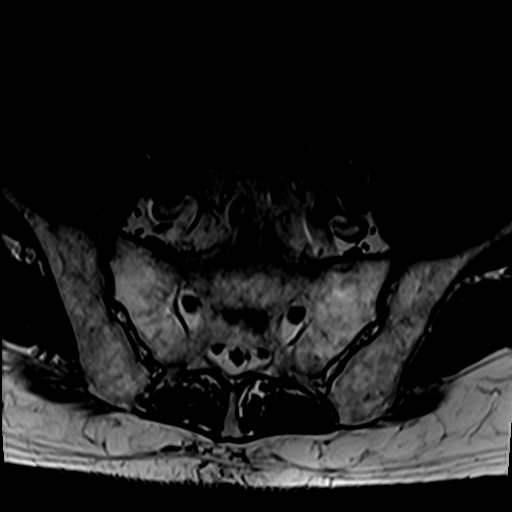
[im 6/36]
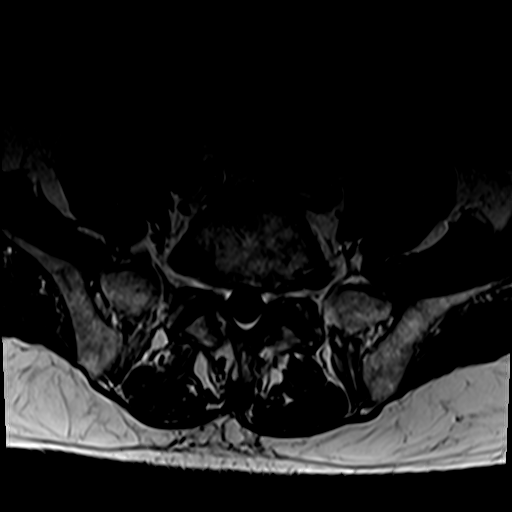
[im 11/36]
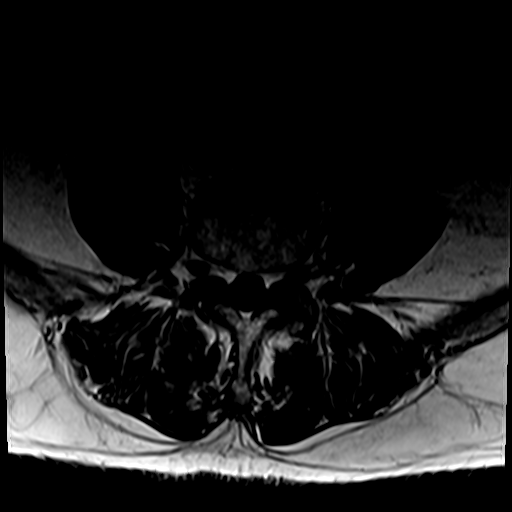
[im 17/36]
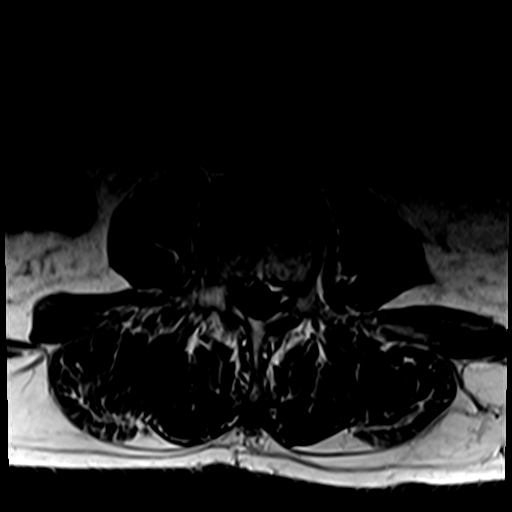
[im 19/36]
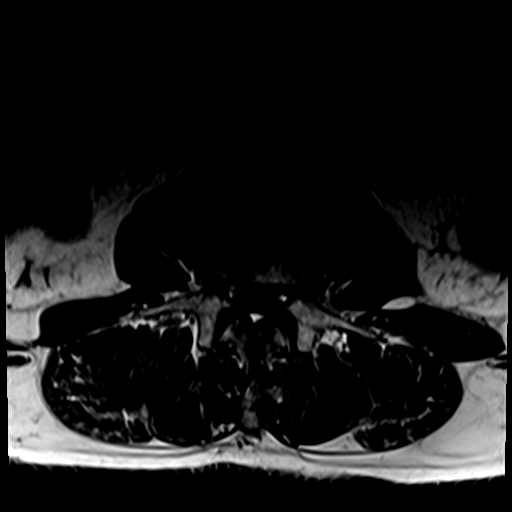
[im 25/36]
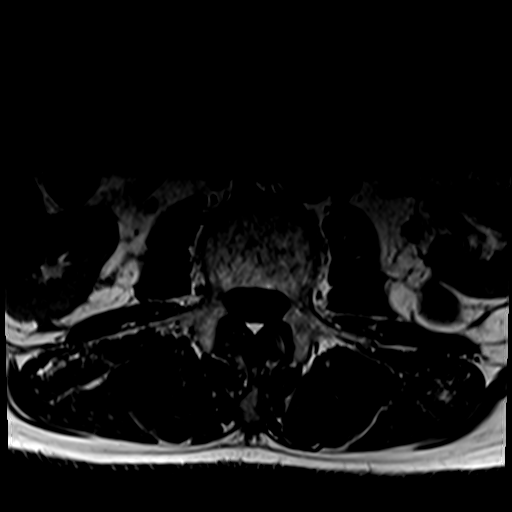
[im 30/36]
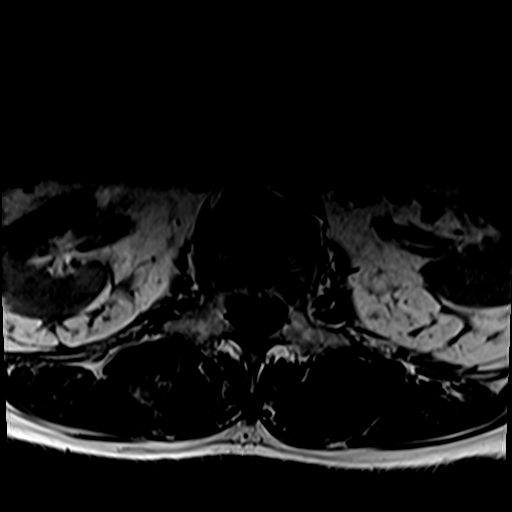
[im 36/36]
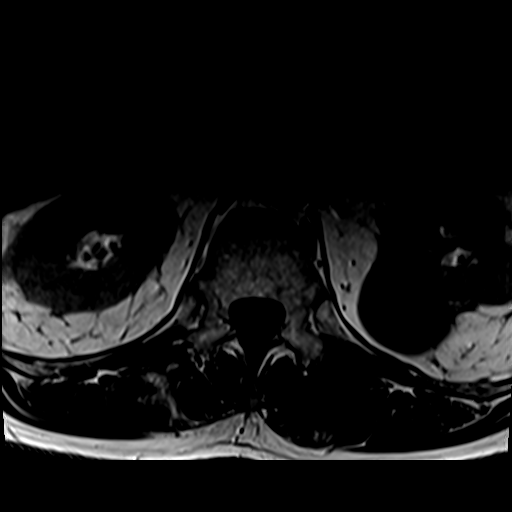

[30 of 48 positions shown; findings below may reference images not displayed]

FINDINGS: Segmentation:  Standard.

Alignment:  Normal.

Vertebrae: Interval augmentation of an L1 compression fracture with
50% vertebral body height loss, greater than on the pretreatment
MRI. New L3 superior endplate compression fracture with 35%
vertebral body height loss centrally and moderate marrow edema
without retropulsion. No suspicious marrow lesion. Unchanged
hemangioma in the T11 vertebral body.

Conus medullaris and cauda equina: Conus extends to the L1 level.
Conus and cauda equina appear normal.

Paraspinal and other soft tissues: Partially visualized bilateral
renal cysts.

Disc levels:

Disc desiccation throughout the lumbar spine. Mild disc space
narrowing from L2-3 to L4-5 and moderate narrowing at L5-S1.

T12-L1: Minimal posterior bulging of the L1 superior endplate,
minimal disc bulging, and mild facet hypertrophy without stenosis.

L1-2: Mild disc bulging and mild facet and ligamentum flavum
hypertrophy without stenosis, not significantly changed.

L2-3: Circumferential disc bulging greater to the left and moderate
facet and ligamentum flavum hypertrophy result in mild-to-moderate
spinal stenosis and mild left neural foraminal stenosis, stable to
slightly progressed.

L3-4: Circumferential disc bulging, endplate spurring, and moderate
facet and ligamentum flavum hypertrophy result in mild spinal
stenosis and mild bilateral neural foraminal stenosis, unchanged.

L4-5: Disc bulging, a small to moderate-sized left central disc
protrusion, and moderate facet and ligamentum flavum hypertrophy
result in moderate spinal stenosis and mild bilateral neural
foraminal stenosis. The disc protrusion has enlarged with worsened
spinal stenosis.

L5-S1: Disc bulging, endplate spurring, and moderate facet
hypertrophy result in mild left greater than right neural foraminal
stenosis without spinal stenosis, unchanged.
IMPRESSION: 1. Acute or subacute L3 compression fracture with 35% height loss.
2. Increased size of a disc protrusion at L4-5 with moderate spinal
stenosis.
3. Unchanged mild-to-moderate spinal stenosis at L2-3 and mild
spinal stenosis at L3-4.

## 2022-01-25 ENCOUNTER — Other Ambulatory Visit: Payer: Self-pay | Admitting: Physical Medicine and Rehabilitation

## 2022-01-25 DIAGNOSIS — S32030A Wedge compression fracture of third lumbar vertebra, initial encounter for closed fracture: Secondary | ICD-10-CM

## 2022-01-30 ENCOUNTER — Ambulatory Visit
Admission: RE | Admit: 2022-01-30 | Discharge: 2022-01-30 | Disposition: A | Discharge status: Home / Self Care | Payer: Medicare Other | Source: Ambulatory Visit | Attending: Physical Medicine and Rehabilitation | Admitting: Physical Medicine and Rehabilitation

## 2022-01-30 DIAGNOSIS — S32030A Wedge compression fracture of third lumbar vertebra, initial encounter for closed fracture: Secondary | ICD-10-CM | POA: Diagnosis not present

## 2022-01-30 NOTE — Consult Note (Signed)
? ? ?Chief Complaint: ?Patient was seen in consultation today for low back pain and L3 compression fracture at the request of Chasnis,Benjamin ? ?Referring Physician(s): ?Chasnis,Benjamin ? ?History of Present Illness: ?Larry Crawford is a 81 y.o. male who presents with persistent low back pain for the last several weeks.  He is unsure when his pain intensified but he did have several days of using a ladder and also moving furniture and he is concerned that he may have led to his current symptoms.  Initially, he went to physical therapy to help manage his pain.  However, during their assessment it became evident that his pain was progressive and localizing in the region of L3.  An MRI was performed on 01/24/2022 demonstrating an acute compression fracture of the superior endplate of L3.  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 ? ?He reports that the L1 fracture was several years ago and treated by Dr. Rudene Christians.  He notes that he received excellent clinical relief following cement augmentation in the past. ? ?In addition to his acute low back pain, he also has more chronic lower back pain with radiculopathy into the left lower extremity along the lateral aspect of the thigh extending toward the knee and also in the lateral aspect of the calf.  This is intermittent and not continuous but is bothersome.  He notes it has been worse since suffering the fracture.  He denies lower extremity paresthesias, weakness or other symptoms of cord compression.  There is no evidence of significant retropulsion or compression on MR imaging. ? ?Past Medical History:  ?Diagnosis Date  ? BPH (benign prostatic hypertrophy)   ? Colon polyp   ? Elevated red blood cell count   ? Erectile dysfunction   ? Gout   ? History of chicken pox   ? History of measles   ? History of mumps   ? IBS (irritable bowel syndrome)   ? MRSA infection 2000  ? nasal  ? Skin cancer (melanoma)  ([place]) 2021  ? left arm  ? ? ?Past Surgical History:  ?Procedure Laterality Date  ? aortic ultrasound  2013  ? normal. Scre[facility] per pt report  ? BCC Excised from chest  1984  ? BUNIONECTOMY Right   ? great toe  ? CATARACT EXTRACTION, BILATERAL    ? CHOLECYSTECTOMY  1997  ? COLONOSCOPY    ? INGUINAL HERNIA REPAIR  [phone]  ? 2 on right/1 on left  ? IR RADIOLOGIST EVAL & MGMT  01/30/2022  ? KYPHOPLASTY N/A 04/03/2021  ? Procedure: L1 KYPHOPLASTY AND BONE BIOPDY;  Surgeon: Hessie Knows, MD;  Location: [facility] ORS;  Service: Orthopedics;  Laterality: N/A;  ? LIPOMA EXCISION  2013  ? NOSE SURGERY    ? divided septum  ? PARTIAL KNEE ARTHROPLASTY Right 01/10/2021  ? Procedure: UNICOMPARTMENTAL KNEE;  Surgeon: Corky Mull, MD;  Location: [facility] ORS;  Service: Orthopedics;  Laterality: Right;  ? PENILE PROSTHESIS IMPLANT  12/17/2013  ? POLYPECTOMY    ? PROSTATE SURGERY    ? Rezum procedure done in office  ? [place]  ? X2  ? SHOULDER ARTHROSCOPY  1984 and 1987  ? left X2 for spurs  ? TONSILLECTOMY AND ADENOIDECTOMY  2002  ? UMBILICAL HERNIA REPAIR    ? ? ?Allergies: ?Tetracyclines & related, Colestipol hcl, Corn-containing products, Pravastatin sodium, Shellfish allergy, Sulfa antibiotics, Welchol [colesevelam], and Zetia [ezetimibe] ? ?Medications: ?Prior to  Admission medications   ?Medication Sig Start Date End Date Taking? Authorizing Provider  ?baclofen (LIORESAL) 10 MG tablet Take 10 mg by mouth 3 (three) times daily.   Yes [provider]  ?HYDROcodone-acetaminophen (NORCO) 5-325 MG tablet Take 1 tablet by mouth every 6 (six) hours as needed for moderate pain. 04/03/21  Yes Hessie Knows, MD  ?allopurinol (ZYLOPRIM) 300 MG tablet Take 1 tablet (300 mg total) by mouth in the morning. ?Patient not taking: Reported on 01/30/2022 10/24/21   Birdie Sons, MD  ?cetirizine (ZYRTEC) 10 MG tablet Take 10 mg by mouth in the morning. ?Patient not taking: Reported on 01/30/2022    [provider]   ?cimetidine (TAGAMET) 200 MG tablet Take 200 mg by mouth daily as needed (heartburn). ?Patient not taking: Reported on 01/30/2022    [provider]  ?Coenzyme Q10 (COQ10) 100 MG CAPS Take 100 mg by mouth in the morning. ?Patient not taking: Reported on 01/30/2022    [provider]  ?Cyanocobalamin (VITAMIN B-12) 5000 MCG SUBL Place 5,000 mcg under the tongue in the morning. ?Patient not taking: Reported on 01/30/2022    [provider]  ?DERMOTIC 0.01 % OIL Apply 1 application topically See admin instructions. 1 application in the ears once or twice weekly ?Patient not taking: Reported on 01/30/2022 09/13/20   [provider]  ?famotidine (PEPCID) 20 MG tablet Take 20 mg by mouth daily as needed for heartburn or indigestion. ?Patient not taking: Reported on 01/30/2022    [provider]  ?fluticasone (FLONASE) 50 MCG/ACT nasal spray Place 1 spray into both nostrils daily as needed for allergies or rhinitis. ?Patient not taking: Reported on 01/30/2022    [provider]  ?influenza vaccine adjuvanted (FLUAD QUADRIVALENT) 0.5 ML injection Inject into the muscle. ?Patient not taking: Reported on 01/30/2022 09/01/21   Carlyle Basques, MD  ?L-Arginine 1000 MG TABS Take 1,000 mg by mouth 3 (three) times a week. ?Patient not taking: Reported on 01/30/2022    [provider]  ?Lifitegrast Shirley Friar) 5 % SOLN Place 1 drop into both eyes 2 (two) times daily. ?Patient not taking: Reported on 01/30/2022    [provider]  ?Lutein 20 MG CAPS Take 20 mg by mouth daily. ?Patient not taking: Reported on 01/30/2022    [provider]  ?Multiple Vitamin (MULTIVITAMIN WITH MINERALS) TABS tablet Take 1 tablet by mouth in the morning. ?Patient not taking: Reported on 01/30/2022    [provider]  ?Multiple Vitamins-Minerals (PRESERVISION AREDS 2 PO) Take 1 tablet by mouth in the morning. ?Patient not taking: Reported on 01/30/2022    [provider]  ?niacin 500 MG tablet Take 500 mg by mouth in the morning. ?Patient not taking: Reported on 01/30/2022    [provider]  ?tadalafil (CIALIS) 5 MG tablet Take 1 tablet (5 mg total) by mouth in the morning. ?Patient not taking: Reported on 01/30/2022 08/21/21   Birdie Sons, MD  ?  ? ?Family History  ?Problem Relation Age of Onset  ? Hypothyroidism Mother   ? Diabetes Brother   ? Obesity Brother   ? Cancer Brother   ?     throat  ? ? ?Social History  ? ?Socioeconomic History  ? Marital status: Married  ?  Spouse name: Freda Munro  ? Number of children: 4  ? Years of education: Not on file  ? Highest education level: 12th grade  ?Occupational History  ? Occupation: retired  ?Tobacco Use  ?  Smoking status: Former  ?  Packs/day: 0.75  ?  Years: 5.00  ?  Pack years: 3.75  ?  Types: Cigarettes  ?  Quit date: 11/19/1958  ?  Years since quitting: 63.2  ? Smokeless tobacco: Never  ?Vaping Use  ? Vaping Use: Never used  ?Substance and Sexual Activity  ? Alcohol use: Yes  ?  Alcohol/week: 0.0 standard drinks  ?  Comment: 2 glasses of wine a month  ? Drug use: No  ? Sexual activity: Not on file  ?Other Topics Concern  ? Not on file  ?Social History Narrative  ? Pt went to [employer] through the [employer].   ? ?Social Determinants of Health  ? ?Financial Resource Strain: Not on file  ?Food Insecurity: Not on file  ?Transportation Needs: Not on file  ?Physical Activity: Not on file  ?Stress: Not on file  ?Social Connections: Not on file  ? ? ?Review of Systems: A 12 point ROS discussed and pertinent positives are indicated in the HPI above.  All other systems are negative. ? ?Review of Systems ? ?Vital Signs: ?BP 134/62   Pulse 72   Temp 97.9 ?F (36.6 ?C)   SpO2 99%  ? ?Physical Exam ?Constitutional:   ?   Appearance: Normal appearance.  ?HENT:  ?   Head: Normocephalic and atraumatic.  ?Eyes:  ?   General: No scleral icterus. ?Cardiovascular:  ?   Rate and Rhythm: Normal rate.  ?Pulmonary:  ?   Effort: Pulmonary effort  is normal.  ?Abdominal:  ?   General: Abdomen is flat.  ?   Palpations: Abdomen is soft.  ?Musculoskeletal:  ?     Back: ? ?   Comments: TTP over the L3 spinous process.   ?Skin: ?   General: Skin is warm an

## 2022-02-01 ENCOUNTER — Other Ambulatory Visit: Payer: Self-pay | Admitting: Interventional Radiology

## 2022-02-05 ENCOUNTER — Other Ambulatory Visit: Payer: Self-pay | Admitting: Interventional Radiology

## 2022-02-05 DIAGNOSIS — S32030A Wedge compression fracture of third lumbar vertebra, initial encounter for closed fracture: Secondary | ICD-10-CM

## 2022-02-05 DIAGNOSIS — M8008XA Age-related osteoporosis with current pathological fracture, vertebra(e), initial encounter for fracture: Secondary | ICD-10-CM

## 2022-02-13 NOTE — Progress Notes (Signed)
Patient on schedule for L3 Kyphoplasty 3/31, called and spoke with patient on phone with pre procedure instructions given. Made aware to be here at 0730, NPO after Mn prior to day of procedure as well as driver post procedure/recovery/discharge. Stated understanding. ?

## 2022-02-14 ENCOUNTER — Other Ambulatory Visit: Payer: Self-pay | Admitting: Student

## 2022-02-15 DIAGNOSIS — R3912 Poor urinary stream: Secondary | ICD-10-CM | POA: Diagnosis not present

## 2022-02-15 DIAGNOSIS — N401 Enlarged prostate with lower urinary tract symptoms: Secondary | ICD-10-CM | POA: Diagnosis not present

## 2022-02-15 DIAGNOSIS — R351 Nocturia: Secondary | ICD-10-CM | POA: Diagnosis not present

## 2022-02-15 NOTE — H&P (Signed)
Chief Complaint: Patient was seen in consultation today for percutaneous cement augmentation with balloon kyphoplasty at L3 at the request of McCullough,Heath K  Referring Physician(s): Sterling Big  Supervising Physician: Oley Balm  Patient Status: [facility] - Out-pt  History of Present Illness: Larry Crawford is a 81 y.o. male w/ PMH of BPH, MRSA lumbar cement augmentation and skin cancer. Pt c/o low back pain for several weeks after moving furniture and using a ladder. He did not have relief with physical therapy, but experienced worsening pain. He had MRI 01/24/22 which revealed an acute compression fracture of superior endplate of L3. Pt had previous compression fracture at L1 with prior cement augmentation. Pt consulted with Dr. Archer Asa, IR, 01/30/22 about treatment options. At that time, pt decided to proceed with percutaneous cement augmentation with balloon kyphoplasty at L3.   MR Lumbar Spine 01/24/22: IMPRESSION: 1. Acute or subacute L3 compression fracture with 35% height loss. 2. Increased size of a disc protrusion at L4-5 with moderate spinal stenosis. 3. Unchanged mild-to-moderate spinal stenosis at L2-3 and mild spinal stenosis at L3-4.   Past Medical History:  Diagnosis Date   BPH (benign prostatic hypertrophy)    Colon polyp    Elevated red blood cell count    Erectile dysfunction    Gout    History of chicken pox    History of measles    History of mumps    IBS (irritable bowel syndrome)    MRSA infection 2000   nasal   Skin cancer (melanoma) (HCC) 2021   left arm    Past Surgical History:  Procedure Laterality Date   aortic ultrasound  2013   normal. Screening ultrasound at [facility] per pt report   BCC Excised from chest  1984   BUNIONECTOMY Right    great toe   CATARACT EXTRACTION, BILATERAL     CHOLECYSTECTOMY  1997   COLONOSCOPY     INGUINAL HERNIA REPAIR  [phone]   2 on right/1 on left   IR RADIOLOGIST EVAL & MGMT  01/30/2022    KYPHOPLASTY N/A 04/03/2021   Procedure: L1 KYPHOPLASTY AND BONE BIOPDY;  Surgeon: Kennedy Bucker, MD;  Location: [facility] ORS;  Service: Orthopedics;  Laterality: N/A;   LIPOMA EXCISION  2013   NOSE SURGERY     divided septum   PARTIAL KNEE ARTHROPLASTY Right 01/10/2021   Procedure: UNICOMPARTMENTAL KNEE;  Surgeon: Christena Flake, MD;  Location: [facility] ORS;  Service: Orthopedics;  Laterality: Right;   PENILE PROSTHESIS IMPLANT  12/17/2013   POLYPECTOMY     PROSTATE SURGERY     Rezum procedure done in office   RIH  1987   X2   SHOULDER ARTHROSCOPY  1984 and 1987   left X2 for spurs   TONSILLECTOMY AND ADENOIDECTOMY  2002   UMBILICAL HERNIA REPAIR      Allergies: Tetracyclines & related, Colestipol hcl, Corn-containing products, Pravastatin sodium, Shellfish allergy, Sulfa antibiotics, Welchol [colesevelam], and Zetia [ezetimibe]  Medications: Prior to Admission medications   Medication Sig Start Date End Date Taking? Authorizing Provider  allopurinol (ZYLOPRIM) 300 MG tablet Take 1 tablet (300 mg total) by mouth in the morning. Patient not taking: Reported on 01/30/2022 10/24/21   Malva Limes, MD  baclofen (LIORESAL) 10 MG tablet Take 10 mg by mouth 3 (three) times daily.    [provider]  cetirizine (ZYRTEC) 10 MG tablet Take 10 mg by mouth in the morning. Patient not taking: Reported on 01/30/2022    [provider]  cimetidine (TAGAMET) 200 MG tablet Take 200 mg by mouth daily as needed (heartburn). Patient not taking: Reported on 01/30/2022    [provider]  Coenzyme Q10 (COQ10) 100 MG CAPS Take 100 mg by mouth in the morning. Patient not taking: Reported on 01/30/2022    [provider]  Cyanocobalamin (VITAMIN B-12) 5000 MCG SUBL Place 5,000 mcg under the tongue in the morning. Patient not taking: Reported on 01/30/2022    [provider]  DERMOTIC 0.01 % OIL Apply 1 application topically See admin instructions. 1 application in the ears  once or twice weekly Patient not taking: Reported on 01/30/2022 09/13/20   [provider]  famotidine (PEPCID) 20 MG tablet Take 20 mg by mouth daily as needed for heartburn or indigestion. Patient not taking: Reported on 01/30/2022    [provider]  fluticasone (FLONASE) 50 MCG/ACT nasal spray Place 1 spray into both nostrils daily as needed for allergies or rhinitis. Patient not taking: Reported on 01/30/2022    [provider]  HYDROcodone-acetaminophen (NORCO) 5-325 MG tablet Take 1 tablet by mouth every 6 (six) hours as needed for moderate pain. 04/03/21   Kennedy Bucker, MD  influenza vaccine adjuvanted (FLUAD QUADRIVALENT) 0.5 ML injection Inject into the muscle. Patient not taking: Reported on 01/30/2022 09/01/21   Judyann Munson, MD  L-Arginine 1000 MG TABS Take 1,000 mg by mouth 3 (three) times a week. Patient not taking: Reported on 01/30/2022    [provider]  Lifitegrast Benay Spice) 5 % SOLN Place 1 drop into both eyes 2 (two) times daily. Patient not taking: Reported on 01/30/2022    [provider]  Lutein 20 MG CAPS Take 20 mg by mouth daily. Patient not taking: Reported on 01/30/2022    [provider]  Multiple Vitamin (MULTIVITAMIN WITH MINERALS) TABS tablet Take 1 tablet by mouth in the morning. Patient not taking: Reported on 01/30/2022    [provider]  Multiple Vitamins-Minerals (PRESERVISION AREDS 2 PO) Take 1 tablet by mouth in the morning. Patient not taking: Reported on 01/30/2022    [provider]  niacin 500 MG tablet Take 500 mg by mouth in the morning. Patient not taking: Reported on 01/30/2022    [provider]  tadalafil (CIALIS) 5 MG tablet Take 1 tablet (5 mg total) by mouth in the morning. Patient not taking: Reported on 01/30/2022 08/21/21   Malva Limes, MD     Family History  Problem Relation Age of Onset   Hypothyroidism Mother    Diabetes Brother    Obesity Brother     Cancer Brother        throat    Social History   Socioeconomic History   Marital status: Married    Spouse name: Velna Hatchet   Number of children: 4   Years of education: Not on file   Highest education level: 12th grade  Occupational History   Occupation: retired  Tobacco Use   Smoking status: Former    Packs/day: 0.75    Years: 5.00    Pack years: 3.75    Types: Cigarettes    Quit date: 11/19/1958    Years since quitting: 63.2   Smokeless tobacco: Never  Vaping Use   Vaping Use: Never used  Substance and Sexual Activity   Alcohol use: Yes    Alcohol/week: 0.0 standard drinks    Comment: 2 glasses of wine a month   Drug use: No   Sexual activity: Not  on file  Other Topics Concern   Not on file  Social History Narrative   Pt went to [employer] school through the [employer].    Social Determinants of Health   Financial Resource Strain: Not on file  Food Insecurity: Not on file  Transportation Needs: Not on file  Physical Activity: Not on file  Stress: Not on file  Social Connections: Not on file    Review of Systems: A 12 point ROS discussed and pertinent positives are indicated in the HPI above.  All other systems are negative.  Review of Systems  Constitutional:  Negative for chills and fever.  Eyes:  Negative for visual disturbance.  Respiratory:  Positive for cough. Negative for shortness of breath.   Cardiovascular:  Negative for chest pain and leg swelling.  Gastrointestinal:  Positive for nausea. Negative for abdominal pain and vomiting.  Musculoskeletal:  Positive for back pain.  Neurological:  Negative for dizziness, light-headedness and headaches.   Vital Signs: BP (!) 153/66   Pulse 73   Temp 97.9 F (36.6 C) (Oral)   Resp 20   SpO2 98%   Physical Exam Constitutional:      Appearance: Normal appearance. He is not ill-appearing.  HENT:     Head: Normocephalic and atraumatic.     Mouth/Throat:     Mouth: Mucous membranes are moist.     Pharynx:  Oropharynx is clear.  Eyes:     Extraocular Movements: Extraocular movements intact.     Pupils: Pupils are equal, round, and reactive to light.  Cardiovascular:     Rate and Rhythm: Normal rate and regular rhythm.     Pulses: Normal pulses.     Heart sounds: Normal heart sounds.  Pulmonary:     Effort: Pulmonary effort is normal. No respiratory distress.     Breath sounds: Normal breath sounds.  Abdominal:     General: Abdomen is flat. Bowel sounds are normal. There is no distension.     Palpations: Abdomen is soft.     Tenderness: There is no abdominal tenderness. There is no guarding.  Musculoskeletal:     Right lower leg: No edema.     Left lower leg: No edema.  Skin:    General: Skin is dry.  Neurological:     Mental Status: He is alert and oriented to person, place, and time.  Psychiatric:        Mood and Affect: Mood normal.        Behavior: Behavior normal.        Thought Content: Thought content normal.        Judgment: Judgment normal.    Imaging: MR LUMBAR SPINE WO CONTRAST  Result Date: 01/24/2022 CLINICAL DATA:  L3 compression fracture. Low back and right leg pain. EXAM: MRI LUMBAR SPINE WITHOUT CONTRAST TECHNIQUE: Multiplanar, multisequence MR imaging of the lumbar spine was performed. No intravenous contrast was administered. COMPARISON:  Lumbar spine MRI 03/14/2021 FINDINGS: Segmentation:  Standard. Alignment:  Normal. Vertebrae: Interval augmentation of an L1 compression fracture with 50% vertebral body height loss, greater than on the pretreatment MRI. New L3 superior endplate compression fracture with 35% vertebral body height loss centrally and moderate marrow edema without retropulsion. No suspicious marrow lesion. Unchanged hemangioma in the T11 vertebral body. Conus medullaris and cauda equina: Conus extends to the L1 level. Conus and cauda equina appear normal. Paraspinal and other soft tissues: Partially visualized bilateral renal cysts. Disc levels: Disc  desiccation throughout the lumbar spine. Mild disc space  narrowing from L2-3 to L4-5 and moderate narrowing at L5-S1. T12-L1: Minimal posterior bulging of the L1 superior endplate, minimal disc bulging, and mild facet hypertrophy without stenosis. L1-2: Mild disc bulging and mild facet and ligamentum flavum hypertrophy without stenosis, not significantly changed. L2-3: Circumferential disc bulging greater to the left and moderate facet and ligamentum flavum hypertrophy result in mild-to-moderate spinal stenosis and mild left neural foraminal stenosis, stable to slightly progressed. L3-4: Circumferential disc bulging, endplate spurring, and moderate facet and ligamentum flavum hypertrophy result in mild spinal stenosis and mild bilateral neural foraminal stenosis, unchanged. L4-5: Disc bulging, a small to moderate-sized left central disc protrusion, and moderate facet and ligamentum flavum hypertrophy result in moderate spinal stenosis and mild bilateral neural foraminal stenosis. The disc protrusion has enlarged with worsened spinal stenosis. L5-S1: Disc bulging, endplate spurring, and moderate facet hypertrophy result in mild left greater than right neural foraminal stenosis without spinal stenosis, unchanged. IMPRESSION: 1. Acute or subacute L3 compression fracture with 35% height loss. 2. Increased size of a disc protrusion at L4-5 with moderate spinal stenosis. 3. Unchanged mild-to-moderate spinal stenosis at L2-3 and mild spinal stenosis at L3-4. Electronically Signed   By: Sebastian Ache M.D.   On: 01/24/2022 17:13   IR Radiologist Eval & Mgmt  Result Date: 01/30/2022 EXAM: NEW PATIENT OFFICE VISIT CHIEF COMPLAINT: SEE EPIC NOTE HISTORY OF PRESENT ILLNESS: SEE EPIC NOTE REVIEW OF SYSTEMS: SEE EPIC NOTE PHYSICAL EXAMINATION: SEE EPIC NOTE ASSESSMENT AND PLAN: SEE EPIC NOTE Electronically Signed   By: Malachy Moan M.D.   On: 01/30/2022 11:09    Labs:  CBC: No results for input(s): WBC, HGB, HCT, PLT  in the last 8760 hours.  COAGS: No results for input(s): INR, APTT in the last 8760 hours.  BMP: Recent Labs    10/09/21 1422  NA 141  K 4.8  CL 101  CO2 21  GLUCOSE 101*  BUN 19  CALCIUM 10.2  CREATININE 1.36*    LIVER FUNCTION TESTS: Recent Labs    10/09/21 1422  BILITOT 0.4  AST 18  ALT 16  ALKPHOS 87  PROT 7.1  ALBUMIN 5.0*    TUMOR MARKERS: No results for input(s): AFPTM, CEA, CA199, CHROMGRNA in the last 8760 hours.  Assessment and Plan: History of BPH, MRSA, lumbar cement augmentation and skin cancer. Pt c/o low back pain for several weeks after moving furniture and using a ladder. He did not have relief with physical therapy, but experienced worsening pain. He had MRI 01/24/22 which revealed an acute compression fracture of superior endplate of L3. Pt had previous compression fracture at L1 with prior cement augmentation. Pt consulted with Dr. Archer Asa, IR, 01/30/22 about treatment options. At that time, pt decided to proceed with percutaneous cement augmentation with balloon kyphoplasty at L3.   MR Lumbar Spine 01/24/22: IMPRESSION: 1. Acute or subacute L3 compression fracture with 35% height loss. 2. Increased size of a disc protrusion at L4-5 with moderate spinal stenosis. 3. Unchanged mild-to-moderate spinal stenosis at L2-3 and mild spinal stenosis at L3-4.  Pt observed to be sitting upright in recliner. He is A&O and calm. He is in no distress.  Pt is NPO per order.  No blood thinners noted. Today's labs pending.  Pt denies tenderness to palpation over L3.   Risks and benefits of percutaneous cement augmentation with balloon kyphoplasty at L3  were discussed with the patient including, but not limited to education regarding the natural healing process of compression fractures without intervention,  bleeding, infection, cement migration which may cause spinal cord damage, paralysis, pulmonary embolism or even death.  This interventional procedure involves  the use of X-rays and because of the nature of the planned procedure, it is possible that we will have prolonged use of X-ray fluoroscopy.  Potential radiation risks to you include (but are not limited to) the following: - A slightly elevated risk for cancer  several years later in life. This risk is typically less than 0.5% percent. This risk is low in comparison to the normal incidence of human cancer, which is 33% for women and 50% for men according to the American [employer] Society. - Radiation induced injury can include skin redness, resembling a rash, tissue breakdown / ulcers and hair loss (which can be temporary or permanent).   The likelihood of either of these occurring depends on the difficulty of the procedure and whether you are sensitive to radiation due to previous procedures, disease, or genetic conditions.   IF your procedure requires a prolonged use of radiation, you will be notified and given written instructions for further action.  It is your responsibility to monitor the irradiated area for the 2 weeks following the procedure and to notify your physician if you are concerned that you have suffered a radiation induced injury.    All of the patient's questions were answered, patient is agreeable to proceed.  Consent signed and in chart.   Thank you for this interesting consult.  I greatly enjoyed meeting HOLLEY WIRT and look forward to participating in their care.  A copy of this report was sent to the requesting provider on this date.  Electronically Signed: Shon Hough, NP 02/16/2022, 8:06 AM   I spent a total of 20 minutes in face to face in clinical consultation, greater than 50% of which was counseling/coordinating care for percutaneous cement augmentation with balloon kyphoplasty at L3.

## 2022-02-16 ENCOUNTER — Encounter: Payer: Self-pay | Admitting: Radiology

## 2022-02-16 ENCOUNTER — Encounter: Payer: Self-pay | Admitting: Anesthesiology

## 2022-02-16 ENCOUNTER — Ambulatory Visit
Admission: RE | Admit: 2022-02-16 | Discharge: 2022-02-16 | Disposition: A | Discharge status: Home / Self Care | Payer: Medicare Other | Source: Ambulatory Visit | Attending: Interventional Radiology | Admitting: Interventional Radiology

## 2022-02-16 ENCOUNTER — Other Ambulatory Visit: Payer: Self-pay

## 2022-02-16 DIAGNOSIS — S32030A Wedge compression fracture of third lumbar vertebra, initial encounter for closed fracture: Secondary | ICD-10-CM

## 2022-02-16 DIAGNOSIS — M8008XA Age-related osteoporosis with current pathological fracture, vertebra(e), initial encounter for fracture: Secondary | ICD-10-CM | POA: Diagnosis not present

## 2022-02-16 DIAGNOSIS — Z8614 Personal history of Methicillin resistant Staphylococcus aureus infection: Secondary | ICD-10-CM | POA: Insufficient documentation

## 2022-02-16 DIAGNOSIS — M4856XA Collapsed vertebra, not elsewhere classified, lumbar region, initial encounter for fracture: Secondary | ICD-10-CM | POA: Diagnosis not present

## 2022-02-16 DIAGNOSIS — Z85828 Personal history of other malignant neoplasm of skin: Secondary | ICD-10-CM | POA: Insufficient documentation

## 2022-02-16 DIAGNOSIS — Z87891 Personal history of nicotine dependence: Secondary | ICD-10-CM | POA: Insufficient documentation

## 2022-02-16 DIAGNOSIS — N400 Enlarged prostate without lower urinary tract symptoms: Secondary | ICD-10-CM | POA: Insufficient documentation

## 2022-02-16 HISTORY — PX: IR KYPHO LUMBAR INC FX REDUCE BONE BX UNI/BIL CANNULATION INC/IMAGING: IMG5519

## 2022-02-16 LAB — CBC
HCT: 39 % (ref 39.0–52.0)
Hemoglobin: 13.4 g/dL (ref 13.0–17.0)
MCH: 30.9 pg (ref 26.0–34.0)
MCHC: 34.4 g/dL (ref 30.0–36.0)
MCV: 90.1 fL (ref 80.0–100.0)
Platelets: 377 10*3/uL (ref 150–400)
RBC: 4.33 MIL/uL (ref 4.22–5.81)
RDW: 13.4 % (ref 11.5–15.5)
WBC: 7.3 10*3/uL (ref 4.0–10.5)
nRBC: 0 % (ref 0.0–0.2)

## 2022-02-16 LAB — PROTIME-INR
INR: 1 (ref 0.8–1.2)
Prothrombin Time: 13.2 seconds (ref 11.4–15.2)

## 2022-02-16 IMAGING — XA IR KYPHO VERTEBRAL LUMBAR AUGMENTATION
1 series · 13 of 13 positions shown · non-contrast
Comparison: none

CLINICAL DATA: Symptomatic subacute L3 compression fracture
deformity.

EXAM:
KYPHOPLASTY AT LUMBAR L3
TECHNIQUE: The procedure, risks (including but not limited to bleeding,
infection, organ damage), benefits, and alternatives were explained
to the patient. Questions regarding the procedure were encouraged
and answered. The patient understands and consents to the procedure.

[Series 1: interv standard · 13 of 13 slices shown]
[im 1/13]
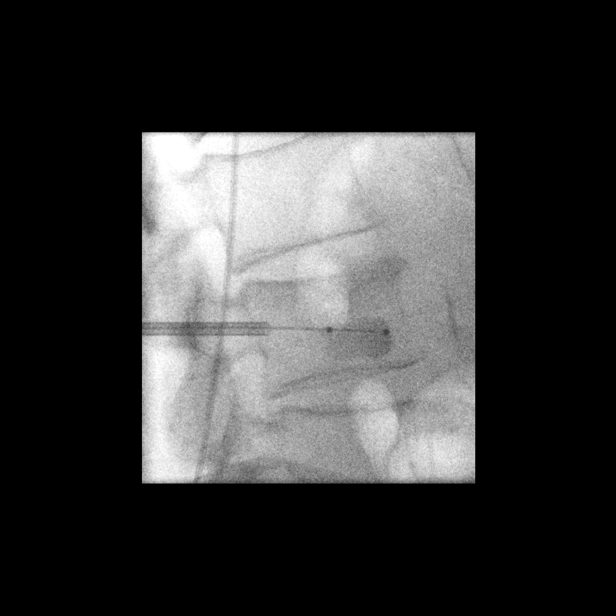
[im 2/13]
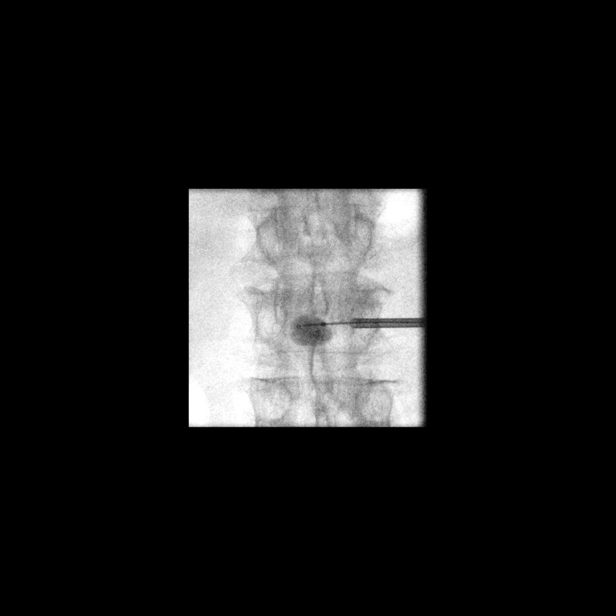
[im 3/13]
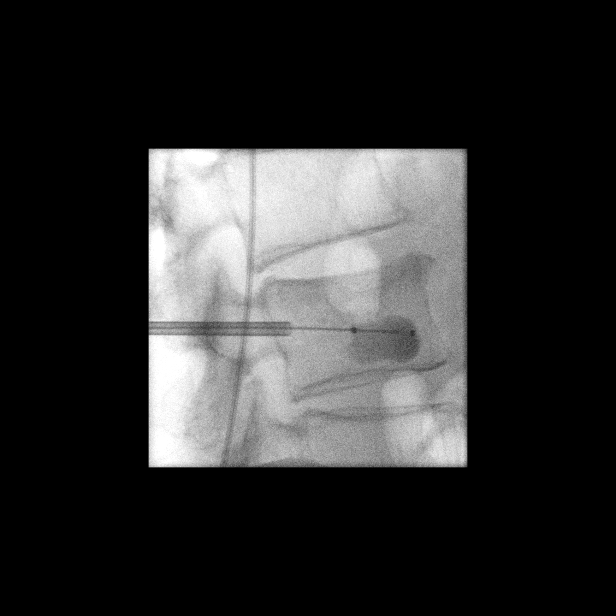
[im 4/13]
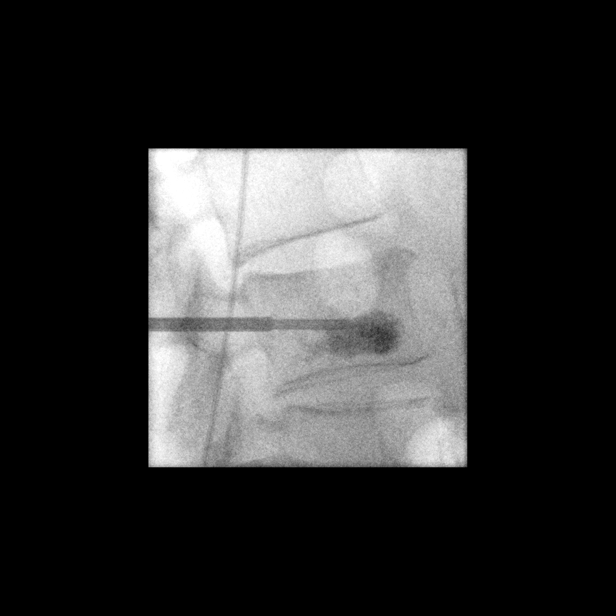
[im 5/13]
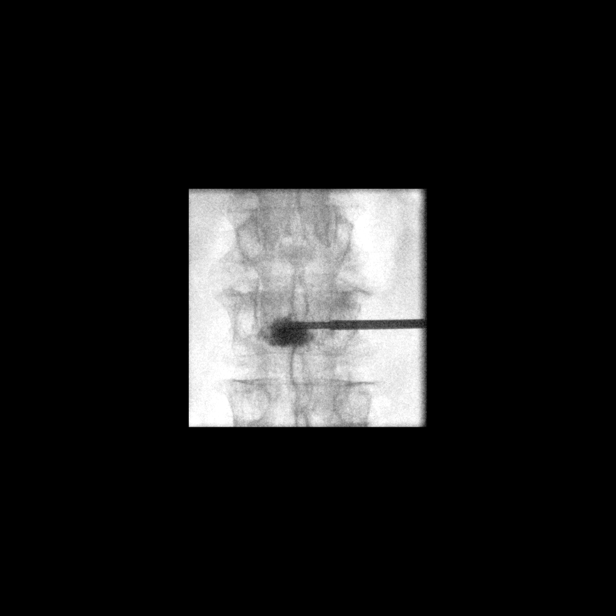
[im 6/13]
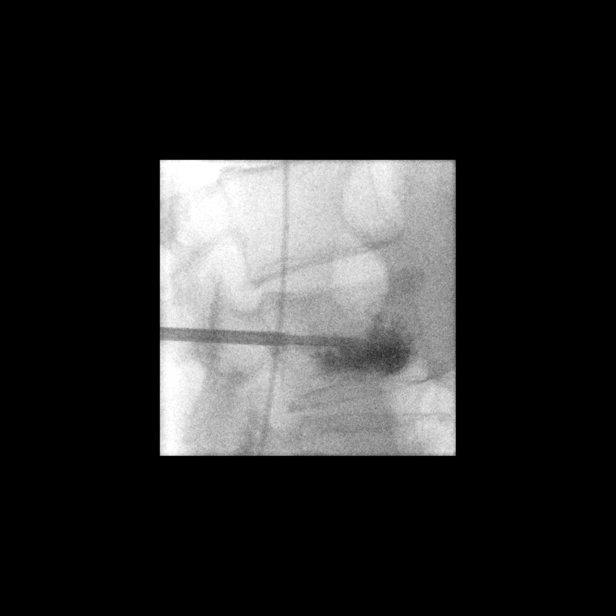
[im 7/13]
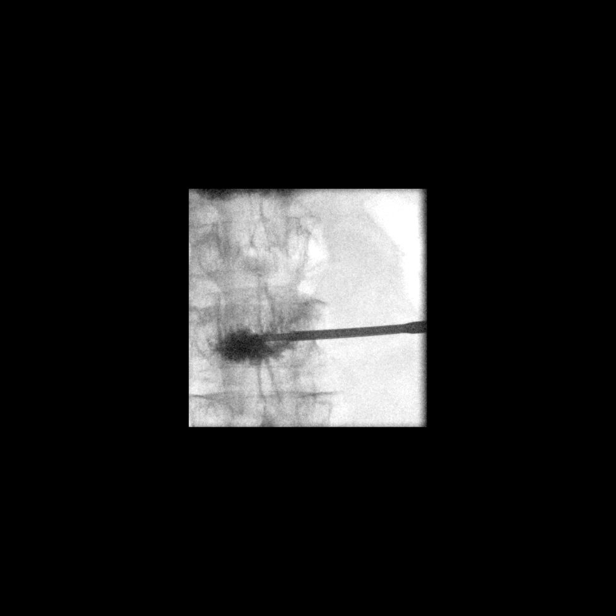
[im 8/13]
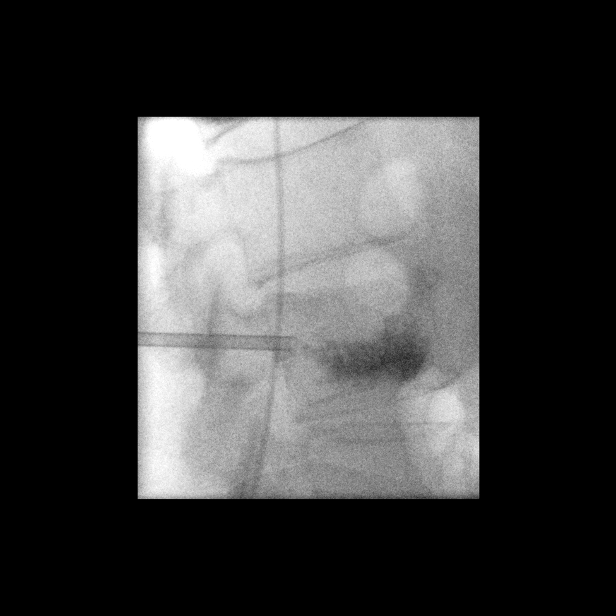
[im 9/13]
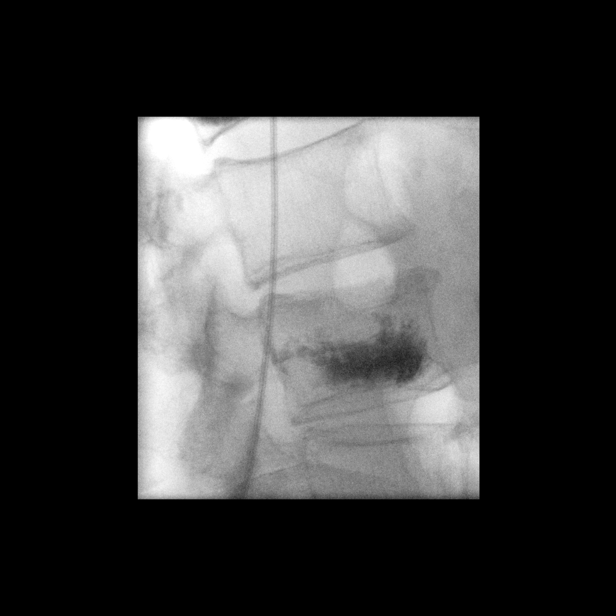
[im 10/13]
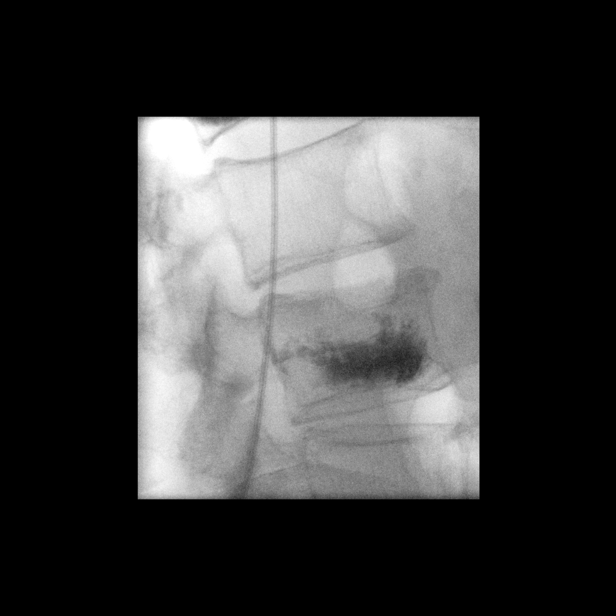
[im 11/13]
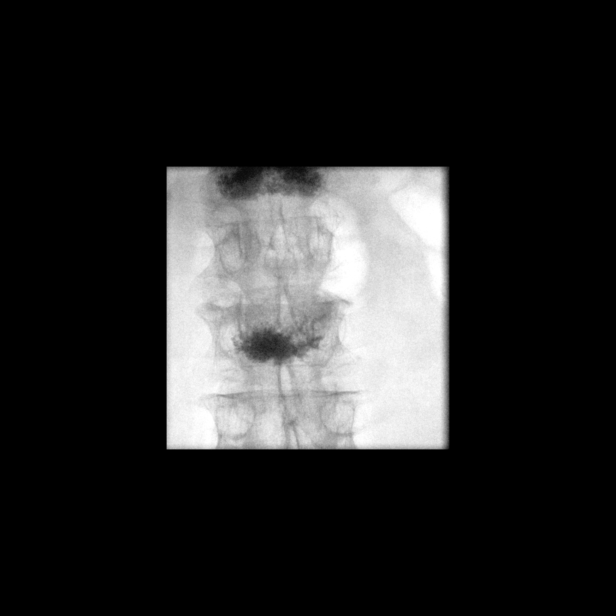
[im 12/13]
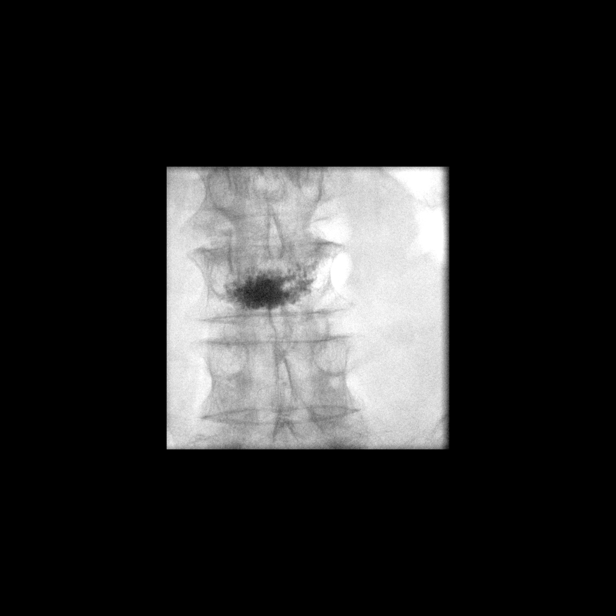
[im 13/13]
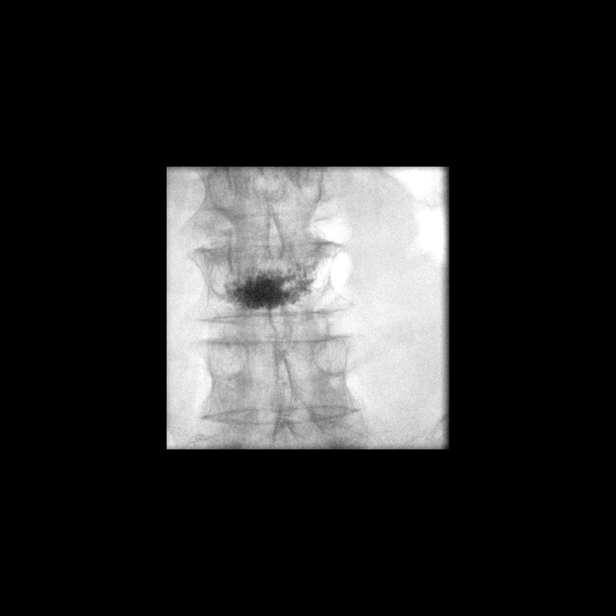

[13 of 13 positions shown; findings below may reference images not displayed]

The patient was placed prone on the fluoroscopic table. The skin
overlying the upper lumbar region was then prepped and draped in the
usual sterile fashion. Maximal barrier sterile technique was
utilized including caps, mask, sterile gowns, sterile gloves,
sterile drape, hand hygiene and skin antiseptic.

Intravenous Fentanyl [EX] and Versed 2mg were administered as
conscious sedation during continuous monitoring of the patient's
level of consciousness and physiological / cardiorespiratory status
by the radiology RN, with a total moderate sedation time of 18
minutes.

As antibiotic prophylaxis, vancomycin 1 g was ordered pre-procedure
and administered intravenously within !one hour! of incision.

The right pedicle at lumbar L3 was then infiltrated with 1%
lidocaine followed by the advancement of a Kyphon trocar needle
through the right pedicle into the posterior one-third. The trocar
was removed and the osteo drill was advanced to the anterior third
of the vertebral body. The osteo drill was retracted. Through the
working cannula, a Kyphon inflatable bone tamp 15 x 2 was advanced
and positioned with the distal marker 5 mm from the anterior aspect
of the cortex. Crossing of the midline was seen on the AP
projection. At this time, the balloon was expanded using contrast
via a Kyphon inflation syringe device via micro tubing.

Inflation continued until there was near apposition across the
midline and with the end plate.

At this time, methylmethacrylate mixture was reconstituted in the
Kyphon bone mixing device system. This was then loaded into the
delivery mechanism, attached to Kyphon bone fillers.

The balloons were deflated and removed followed by the instillation
of methylmethacrylate mixture with excellent filling in the AP and
lateral projections. No extravasation was noted in the disk spaces
or posteriorly into the spinal canal. No epidural venous
contamination was seen.

The patient tolerated the procedure well. There were no acute
complications. The working cannulae and the bone filler were then
retrieved and removed.

COMPLICATIONS:
COMPLICATIONS
None immediate.
IMPRESSION: 1. Status post vertebral body augmentation using balloon kyphoplasty
at lumbar L3 as described without event.
2. Recommend continued clinical assessment and treatment for
osteoporosis as clinically indicated.

## 2022-02-16 MED ORDER — FENTANYL CITRATE (PF) 100 MCG/2ML IJ SOLN
INTRAMUSCULAR | Status: AC
  Filled 2022-02-16: qty 2

## 2022-02-16 MED ORDER — MIDAZOLAM HCL 2 MG/2ML IJ SOLN
INTRAMUSCULAR | Status: AC | PRN
  Administered 2022-02-16 (×2): .5 mg via INTRAVENOUS
  Administered 2022-02-16: 1 mg via INTRAVENOUS

## 2022-02-16 MED ORDER — MIDAZOLAM HCL 2 MG/2ML IJ SOLN
INTRAMUSCULAR | Status: AC
  Filled 2022-02-16: qty 2

## 2022-02-16 MED ORDER — FENTANYL CITRATE (PF) 100 MCG/2ML IJ SOLN
INTRAMUSCULAR | Status: AC | PRN
  Administered 2022-02-16: 50 ug via INTRAVENOUS
  Administered 2022-02-16 (×2): 25 ug via INTRAVENOUS

## 2022-02-16 MED ORDER — LIDOCAINE HCL (PF) 1 % IJ SOLN
INTRAMUSCULAR | Status: AC
  Administered 2022-02-16: 9 mL
  Filled 2022-02-16: qty 30

## 2022-02-16 MED ORDER — VANCOMYCIN HCL 500 MG/100ML IV SOLN
INTRAVENOUS | Status: AC | PRN
  Administered 2022-02-16: 1000 mg via INTRAVENOUS

## 2022-02-16 MED ORDER — SODIUM CHLORIDE 0.9 % IV SOLN
INTRAVENOUS | Status: DC
  Filled 2022-02-16: qty 1000

## 2022-02-16 MED ORDER — VANCOMYCIN HCL IN DEXTROSE 1-5 GM/200ML-% IV SOLN
1000.0000 mg | INTRAVENOUS | Status: DC
  Filled 2022-02-16: qty 200

## 2022-02-16 NOTE — Procedures (Addendum)
?  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 ? ?Postprocedure diagnosis: same ?EBL:    minimal ?Complications:   none immediate ? ?See full dictation in [facility]. ? ?D. Arne Cleveland MD ?Main # [phone] ?Pager  [phone] ?Mobile [phone] ?  ? ?

## 2022-02-16 NOTE — Discharge Instructions (Addendum)
Kyphoplasty, Care After ?This sheet gives you information about how to care for yourself after your procedure. Your health care provider may also give you more specific instructions. If you have problems or questions, contact your health care provider. ?What can I expect after the procedure? ?After the procedure, it is common to have back pain. ?Follow these instructions at home: ?Medicines and Diet ?Take over-the-counter and prescription medicines only as told by your health care provider. ?Take over-the-counter or prescription medicines that you normally take.  However, if you are taking Aspirin or an anticoagulant/blood thinner you will be told when you can resume taking these by the healthcare provider.  ?You may resume a regular diet ?Eat foods that are high in fiber, such as beans, whole grains, and fresh fruits and vegetables. ?Limit foods that are high in fat and processed sugars, such as fried or sweet foods. ?Puncture site care ?  ?You may remove bandaid tomorrow after taking a shower.  Replace daily with a clean bandaid until healed. ?Check your puncture site every day for signs of infection. Check for: ?Redness, swelling, or pain. ?Fluid or blood. ?Warmth. ?Pus or a bad smell. ?  ?Managing pain, stiffness, and swelling ?If directed, put ice on the painful area. To do this: ?You may use an ice pack as needed to the injection sites on your back.  Ice to the site 20 minutes on and 20 minutes off, as needed. ?Place a towel between your skin and the bag. ?  ?Activity ?No driving or operating machinery for 24 hours after your procedure. ?Do not lift anything that is heavier than a milk jug for 1 to 2 weeks or determined by your physician.Marland Kitchen  ?Follow up with your physician in 2 weeks.  ?Contact a health care provider if: ?You have a fever greater than 100 degrees ?You have redness, swelling, or pain at the site of your puncture. ?You have fluid, blood, or pus coming from the puncture site. ?You have pain that  gets worse or does not get better with medicine. ?You develop numbness or weakness in any part of your body. ?Get help right away if: ?You have chest pain. ?You have difficulty breathing. ?You have weakness, numbness, or tingling in your legs. ?You cannot control your bladder or bowel movements (incontinence). ?You suddenly become weak or numb on one side of your body. ?You become very confused. ?You have trouble speaking or understanding, or both. ?These symptoms may represent a serious problem that is an emergency. Do not wait to see if the symptoms will go away. Get medical help right away. Call your local emergency services (911 in the [place] Do not drive yourself to the hospital. ?Summary ?Follow instructions from your health care provider about how to take care of your puncture site. ?Take over-the-counter and prescription medicines only as told by your health care provider. ?Rest your back and avoid intense physical activity for as long as told by your health care provider. ?Contact a health care provider if you have pain that gets worse or does not get better with medicine. ?Keep all follow-up visits. This is important. ?This information is not intended to replace advice given to you by your health care provider. Make sure you discuss any questions you have with your health care provider. ?Document Revised: 02/24/2020 Document Reviewed: 02/24/2020 ?Elsevier Patient Education ? [place]. ?   ?   ?Kyphoplasty, Care After ?This sheet gives you information about how to care for yourself after your procedure. Your  health care provider may also give you more specific instructions. If you have problems or questions, contact your health care provider. ?What can I expect after the procedure? ?After the procedure, it is common to have back pain. ?Follow these instructions at home: ?Medicines and Diet ?Take over-the-counter and prescription medicines only as told by your health care provider. ?Take  over-the-counter or prescription medicines that you normally take.  However, if you are taking Aspirin or an anticoagulant/blood thinner you will be told when you can resume taking these by the healthcare provider.  ?You may resume a regular diet ?Eat foods that are high in fiber, such as beans, whole grains, and fresh fruits and vegetables. ?Limit foods that are high in fat and processed sugars, such as fried or sweet foods. ?Puncture site care ?  ?You may remove bandaid tomorrow after taking a shower.  Replace daily with a clean bandaid until healed. ?Check your puncture site every day for signs of infection. Check for: ?Redness, swelling, or pain. ?Fluid or blood. ?Warmth. ?Pus or a bad smell. ?  ?Managing pain, stiffness, and swelling ?If directed, put ice on the painful area. To do this: ?You may use an ice pack as needed to the injection sites on your back.  Ice to the site 20 minutes on and 20 minutes off, as needed. ?Place a towel between your skin and the bag. ?  ?Activity ?No driving or operating machinery for 24 hours after your procedure. ?Do not lift anything that is heavier than a milk jug for 1 to 2 weeks or determined by your physician.Marland Kitchen  ?Follow up with your physician in 2 weeks.  ?Contact a health care provider if: ?You have a fever greater than 100 degrees ?You have redness, swelling, or pain at the site of your puncture. ?You have fluid, blood, or pus coming from the puncture site. ?You have pain that gets worse or does not get better with medicine. ?You develop numbness or weakness in any part of your body. ?Get help right away if: ?You have chest pain. ?You have difficulty breathing. ?You have weakness, numbness, or tingling in your legs. ?You cannot control your bladder or bowel movements (incontinence). ?You suddenly become weak or numb on one side of your body. ?You become very confused. ?You have trouble speaking or understanding, or both. ?These symptoms may represent a serious problem  that is an emergency. Do not wait to see if the symptoms will go away. Get medical help right away. Call your local emergency services (911 in the [place] Do not drive yourself to the hospital. ?Summary ?Follow instructions from your health care provider about how to take care of your puncture site. ?Take over-the-counter and prescription medicines only as told by your health care provider. ?Rest your back and avoid intense physical activity for as long as told by your health care provider. ?Contact a health care provider if you have pain that gets worse or does not get better with medicine. ?Keep all follow-up visits. This is important. ?This information is not intended to replace advice given to you by your health care provider. Make sure you discuss any questions you have with your health care provider. ?Document Revised: 02/24/2020 Document Reviewed: 02/24/2020 ?Elsevier Patient Education ? [place]. ?   ?  Kyphoplasty, Care After ?This sheet gives you information about how to care for yourself after your procedure. Your health care provider may also give you more specific instructions. If you have problems or questions, contact your  health care provider. ?What can I expect after the procedure? ?After the procedure, it is common to have back pain. ?Follow these instructions at home: ?Medicines and Diet ?Take over-the-counter and prescription medicines only as told by your health care provider. ?Take over-the-counter or prescription medicines that you normally take.  However, if you are taking Aspirin or an anticoagulant/blood thinner you will be told when you can resume taking these by the healthcare provider.  ?You may resume a regular diet ?Eat foods that are high in fiber, such as beans, whole grains, and fresh fruits and vegetables. ?Limit foods that are high in fat and processed sugars, such as fried or sweet foods. ?Puncture site care ?  ?You may remove bandaid tomorrow after taking a shower.   Replace daily with a clean bandaid until healed. ?Check your puncture site every day for signs of infection. Check for: ?Redness, swelling, or pain. ?Fluid or blood. ?Warmth. ?Pus or a bad smell. ?  ?Managing pain, stiff

## 2022-02-16 NOTE — Progress Notes (Signed)
Patient clinically stable post L3 Kyphoplasty per DR Vernard Gambles, tolerated well. Denies complaints post procedure. Received Versed 2 mg along with Fentanyl 100 mcg IV for procedure. Report given to [place] post procedure/specials. ?

## 2022-02-16 NOTE — Progress Notes (Signed)
Ice pack placed along kyphoplasty site per orders. Site without complications at present( no hematoma, edema, ecchymosis, drainage noted). ?

## 2022-02-22 ENCOUNTER — Other Ambulatory Visit: Payer: Self-pay | Admitting: Interventional Radiology

## 2022-02-22 DIAGNOSIS — S32030A Wedge compression fracture of third lumbar vertebra, initial encounter for closed fracture: Secondary | ICD-10-CM

## 2022-02-27 ENCOUNTER — Ambulatory Visit
Admission: RE | Admit: 2022-02-27 | Discharge: 2022-02-27 | Disposition: A | Discharge status: Home / Self Care | Payer: Medicare Other | Source: Ambulatory Visit | Attending: Interventional Radiology | Admitting: Interventional Radiology

## 2022-02-27 ENCOUNTER — Encounter: Payer: Self-pay | Admitting: *Deleted

## 2022-02-27 DIAGNOSIS — S32030A Wedge compression fracture of third lumbar vertebra, initial encounter for closed fracture: Secondary | ICD-10-CM

## 2022-02-27 DIAGNOSIS — S32030D Wedge compression fracture of third lumbar vertebra, subsequent encounter for fracture with routine healing: Secondary | ICD-10-CM | POA: Diagnosis not present

## 2022-02-27 DIAGNOSIS — Z9889 Other specified postprocedural states: Secondary | ICD-10-CM | POA: Diagnosis not present

## 2022-02-27 HISTORY — PX: IR RADIOLOGIST EVAL & MGMT: IMG5224

## 2022-02-27 NOTE — Progress Notes (Signed)
Patient ID: Larry Crawford, male   DOB: July 07, 1941, 81 y.o.   MRN: [id number] ?    ? ? ?Chief Complaint: ?Patient was consulted remotely today (TeleHealth) for lumbar compression fracture post kyphoplasty at the request of McCullough,Heath K.   ? ?Referring Physician(s): ?Chasnis,Benjamin   ? ?History of Present Illness: ?Larry Crawford is a 81 y.o. male who initially presented to [place]  ?01/30/2022 with persistent low back pain for the last several weeks. He was unsure when his pain intensified but he did have several days of using a ladder and also moving furniture and he was concerned that he may have led to his current symptoms. Initially, he went to physical therapy to help manage his pain. However, during their assessment it became evident that his pain was progressive and localizing in the region of L3. ?323 an MRI was performed   demonstrating an acute compression fracture of the superior endplate of L3. 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 He reports that the L1 fracture was several years ago and treated by Dr. Rudene Christians. He notes that he received excellent clinical relief following cement augmentation in the past. ?In addition to his acute low back pain, he also has more chronic lower back pain with radiculopathy into the left lower extremity along the lateral aspect of the thigh extending toward the knee and also in the lateral aspect of the calf. This is intermittent and not continuous but is bothersome. He notes it has been worse since suffering the fracture. He denies lower extremity paresthesias, weakness or other symptoms of cord compression. There is no evidence of significant retropulsion or compression on MR imaging. ?02/16/2022 the patient went technically successful   lumbar L3 kyphoplasty ? ?He states that he has done well since the procedure.  His pain has significant improved, "not painful".  He has some persistent bandlike pain  across his lumbar region without lateralization, which he rates at its worst 4 out of 10 on the visual analog pain scale.  This is treated adequately with p.o. ibuprofen and/or acetaminophen.  No lower extremity radiculopathy.  No new bowel or bladder control issues.  He is back to full activity. ? ?Past Medical History:  ?Diagnosis Date  ? BPH (benign prostatic hypertrophy)   ? Colon polyp   ? Elevated red blood cell count   ? Erectile dysfunction   ? Gout   ? History of chicken pox   ? History of measles   ? History of mumps   ? IBS (irritable bowel syndrome)   ? MRSA infection 2000  ? nasal  ? Skin cancer (melanoma) ([place]) 2021  ? left arm  ? ? ?Past Surgical History:  ?Procedure Laterality Date  ? aortic ultrasound  2013  ? normal. Screening ultrasound at [facility] per pt report  ? BCC Excised from chest  1984  ? BUNIONECTOMY Right   ? great toe  ? CATARACT EXTRACTION, BILATERAL    ? CHOLECYSTECTOMY  1997  ? COLONOSCOPY    ? INGUINAL HERNIA REPAIR  [phone]  ? 2 on right/1 on left  ? IR KYPHO LUMBAR INC FX REDUCE BONE BX UNI/BIL CANNULATION INC/IMAGING  02/16/2022  ? IR RADIOLOGIST EVAL & MGMT  01/30/2022  ? KYPHOPLASTY N/A 04/03/2021  ? Procedure: L1 KYPHOPLASTY AND BONE BIOPDY;  Surgeon: Hessie Knows, MD;  Location: [facility] ORS;  Service: Orthopedics;  Laterality: N/A;  ? LIPOMA EXCISION  2013  ? NOSE SURGERY    ?  divided septum  ? PARTIAL KNEE ARTHROPLASTY Right 01/10/2021  ? Procedure: UNICOMPARTMENTAL KNEE;  Surgeon: Corky Mull, MD;  Location: [facility] ORS;  Service: Orthopedics;  Laterality: Right;  ? PENILE PROSTHESIS IMPLANT  12/17/2013  ? POLYPECTOMY    ? PROSTATE SURGERY    ? Rezum procedure done in office  ? [place]  ? X2  ? SHOULDER ARTHROSCOPY  1984 and 1987  ? left X2 for spurs  ? TONSILLECTOMY AND ADENOIDECTOMY  2002  ? UMBILICAL HERNIA REPAIR    ? ? ?Allergies: ?Tetracyclines & related, Colestipol hcl, Corn-containing products, Pravastatin sodium, Shellfish allergy, Sulfa antibiotics, Welchol [colesevelam],  and Zetia [ezetimibe] ? ?Medications: ?Prior to Admission medications   ?Medication Sig Start Date End Date Taking? Authorizing Provider  ?allopurinol (ZYLOPRIM) 300 MG tablet Take 1 tablet (300 mg total) by mouth in the morning. 10/24/21   Birdie Sons, MD  ?baclofen (LIORESAL) 10 MG tablet Take 10 mg by mouth 3 (three) times daily.    [provider]  ?cetirizine (ZYRTEC) 10 MG tablet Take 10 mg by mouth in the morning.    [provider]  ?cimetidine (TAGAMET) 200 MG tablet Take 200 mg by mouth daily as needed (heartburn). ?Patient not taking: Reported on 01/30/2022    [provider]  ?Coenzyme Q10 (COQ10) 100 MG CAPS Take 100 mg by mouth in the morning.    [provider]  ?Cyanocobalamin (VITAMIN B-12) 5000 MCG SUBL Place 5,000 mcg under the tongue in the morning.    [provider]  ?DERMOTIC 0.01 % OIL Apply 1 application. topically See admin instructions. 1 application in the ears once or twice weekly 09/13/20   [provider]  ?famotidine (PEPCID) 20 MG tablet Take 20 mg by mouth daily as needed for heartburn or indigestion.    [provider]  ?fluticasone (FLONASE) 50 MCG/ACT nasal spray Place 1 spray into both nostrils daily as needed for allergies or rhinitis. ?Patient not taking: Reported on 01/30/2022    [provider]  ?HYDROcodone-acetaminophen (NORCO) 5-325 MG tablet Take 1 tablet by mouth every 6 (six) hours as needed for moderate pain. 04/03/21   Hessie Knows, MD  ?influenza vaccine adjuvanted (FLUAD QUADRIVALENT) 0.5 ML injection Inject into the muscle. ?Patient not taking: Reported on 01/30/2022 09/01/21   Carlyle Basques, MD  ?L-Arginine 1000 MG TABS Take 1,000 mg by mouth 3 (three) times a week.    [provider]  ?Lifitegrast Shirley Friar) 5 % SOLN Place 1 drop into both eyes 2 (two) times daily.    [provider]  ?Lutein 20 MG CAPS Take 20 mg by mouth daily.    [provider]  ?Multiple  Vitamin (MULTIVITAMIN WITH MINERALS) TABS tablet Take 1 tablet by mouth in the morning.    [provider]  ?Multiple Vitamins-Minerals (PRESERVISION AREDS 2 PO) Take 1 tablet by mouth in the morning.    [provider]  ?niacin 500 MG tablet Take 500 mg by mouth in the morning.    [provider]  ?tadalafil (CIALIS) 5 MG tablet Take 1 tablet (5 mg total) by mouth in the morning. 08/21/21   Birdie Sons, MD  ?  ? ?Family History  ?Problem Relation Age of Onset  ? Hypothyroidism Mother   ? Diabetes Brother   ? Obesity Brother   ? Cancer Brother   ?     throat  ? ? ?Social History  ? ?Socioeconomic History  ? Marital status: Married  ?  Spouse name: Freda Munro  ? Number of children: 4  ? Years of education: Not on file  ? Highest education level: 12th grade  ?Occupational History  ? Occupation: retired  ?Tobacco Use  ? Smoking status: Former  ?  Packs/day: 0.75  ?  Years: 5.00  ?  Pack years: 3.75  ?  Types: Cigarettes  ?  Quit date: 11/19/1958  ?  Years since quitting: 63.3  ? Smokeless tobacco: Never  ?Vaping Use  ? Vaping Use: Never used  ?Substance and Sexual Activity  ? Alcohol use: Yes  ?  Alcohol/week: 0.0 standard drinks  ?  Comment: 2 glasses of wine a month  ? Drug use: No  ? Sexual activity: Not on file  ?Other Topics Concern  ? Not on file  ?Social History Narrative  ? Pt went to [place] through the [employer].   ? ?Social Determinants of Health  ? ?Financial Resource Strain: Not on file  ?Food Insecurity: Not on file  ?Transportation Needs: Not on file  ?Physical Activity: Not on file  ?Stress: Not on file  ?Social Connections: Not on file  ? ? ?ECOG Status: ?1 - Symptomatic but completely ambulatory ? ?Review of Systems ? ?Review of Systems: A 12 point ROS discussed and pertinent positives are indicated in the HPI above.  All other systems are negative. ? ?Physical Exam ?No direct physical exam was performed (except for noted visual exam findings with Video Visits).  ?   ?Vital  Signs: ?There were no vitals taken for this visit. ? ?Imaging: ?IR KYPHO LUMBAR INC FX REDUCE BONE BX UNI/BIL CANNULATION INC/IMAGING ? ?Result Date: 02/16/2022 ?CLINICAL DATA:  Symptomatic subacute L3 comp

## 2022-03-13 DIAGNOSIS — M5136 Other intervertebral disc degeneration, lumbar region: Secondary | ICD-10-CM | POA: Diagnosis not present

## 2022-03-13 DIAGNOSIS — M47816 Spondylosis without myelopathy or radiculopathy, lumbar region: Secondary | ICD-10-CM | POA: Diagnosis not present

## 2022-03-13 DIAGNOSIS — S32030A Wedge compression fracture of third lumbar vertebra, initial encounter for closed fracture: Secondary | ICD-10-CM | POA: Diagnosis not present

## 2022-03-13 DIAGNOSIS — M48061 Spinal stenosis, lumbar region without neurogenic claudication: Secondary | ICD-10-CM | POA: Diagnosis not present

## 2022-03-13 DIAGNOSIS — M5416 Radiculopathy, lumbar region: Secondary | ICD-10-CM | POA: Diagnosis not present

## 2022-03-13 DIAGNOSIS — M62830 Muscle spasm of back: Secondary | ICD-10-CM | POA: Diagnosis not present

## 2022-03-13 DIAGNOSIS — M5126 Other intervertebral disc displacement, lumbar region: Secondary | ICD-10-CM | POA: Diagnosis not present

## 2022-03-16 DIAGNOSIS — M5416 Radiculopathy, lumbar region: Secondary | ICD-10-CM | POA: Diagnosis not present

## 2022-03-16 DIAGNOSIS — M5126 Other intervertebral disc displacement, lumbar region: Secondary | ICD-10-CM | POA: Diagnosis not present

## 2022-03-16 DIAGNOSIS — M48061 Spinal stenosis, lumbar region without neurogenic claudication: Secondary | ICD-10-CM | POA: Diagnosis not present

## 2022-03-22 DIAGNOSIS — Z20822 Contact with and (suspected) exposure to covid-19: Secondary | ICD-10-CM | POA: Diagnosis not present

## 2022-03-30 LAB — BASIC METABOLIC PANEL
BUN: 26 — AB (ref 4–21)
CO2: 25 — AB (ref 13–22)
Chloride: 102 (ref 99–108)
Creatinine: 1.3 (ref 0.6–1.3)
Glucose: 100
Potassium: 4.2 mEq/L (ref 3.5–5.1)
Sodium: 133 — AB (ref 137–147)

## 2022-03-30 LAB — CBC AND DIFFERENTIAL
HCT: 39 — AB (ref 41–53)
Hemoglobin: 13.6 (ref 13.5–17.5)
WBC: 9.6

## 2022-03-30 LAB — CBC: RBC: 4.32 (ref 3.87–5.11)

## 2022-03-30 LAB — TSH: TSH: 1.81 (ref 0.41–5.90)

## 2022-03-30 LAB — COMPREHENSIVE METABOLIC PANEL
Albumin: 4.1 (ref 3.5–5.0)
Calcium: 9.6 (ref 8.7–10.7)
eGFR: 54

## 2022-03-30 LAB — IRON,TIBC AND FERRITIN PANEL: Iron: 372

## 2022-04-05 NOTE — Progress Notes (Unsigned)
I,Jana Robinson,acting as a scribe for Lelon Huh, MD.,have documented all relevant documentation on the behalf of Lelon Huh, MD,as directed by  Lelon Huh, MD while in the presence of Lelon Huh, MD.    Established patient visit   Patient: Larry Crawford   DOB: 17-Jan-1941   81 y.o. Male  MRN: [id number] Visit Date: 04/09/2022  Today's healthcare provider: Lelon Huh, MD   Chief Complaint  Patient presents with   Hyperlipidemia   Subjective    HPI  Lipid/Cholesterol, Follow-up  Last lipid panel Other pertinent labs  Lab Results  Component Value Date   CHOL 262 (H) 10/09/2021   HDL 41 10/09/2021   LDLCALC 190 (H) 10/09/2021   TRIG 165 (H) 10/09/2021   CHOLHDL 6.4 (H) 10/09/2021   Lab Results  Component Value Date   ALT 16 10/09/2021   AST 18 10/09/2021   PLT 377 02/16/2022   TSH 1.950 12/13/2016     He was last seen for this 6 months ago.  Management since that visit includes focus on changing diet to lower in fat. Encouraged prudent diet and continue walking regiment.  He reports poor compliance with treatment. Reports he is having trouble with his back and unable to walk for exercise.  Trying to eat less fat.     Current diet: in general, a "healthy" diet   Current exercise: none  The ASCVD Risk score (Arnett DK, et al., 2019) failed to calculate for the following reasons:   The 2019 ASCVD risk score is only valid for ages 20 to 65  ---------------------------------------------------------------------------------------------------  He also reports he has been followed by orthopedics for lumbar compression fracture and had kyphoplasty last year. He has another MRI in March revealing another a new L3 compression fracture along with diffuse  lumbar disk disease. He continues to have significant daily back pain and has been referred to Dr. Sharlet Salina for lumbar spine injections. He was advised he needs to have bone density test due to compression  fracture.  Medications: Outpatient Medications Prior to Visit  Medication Sig   allopurinol (ZYLOPRIM) 300 MG tablet Take 1 tablet (300 mg total) by mouth in the morning.   baclofen (LIORESAL) 10 MG tablet Take 10 mg by mouth 3 (three) times daily.   cetirizine (ZYRTEC) 10 MG tablet Take 10 mg by mouth in the morning.   cimetidine (TAGAMET) 200 MG tablet Take 200 mg by mouth daily as needed (heartburn).   Coenzyme Q10 (COQ10) 100 MG CAPS Take 100 mg by mouth in the morning.   Cyanocobalamin (VITAMIN B-12) 5000 MCG SUBL Place 5,000 mcg under the tongue in the morning.   DERMOTIC 0.01 % OIL Apply 1 application. topically See admin instructions. 1 application in the ears once or twice weekly   famotidine (PEPCID) 20 MG tablet Take 20 mg by mouth daily as needed for heartburn or indigestion.   fluticasone (FLONASE) 50 MCG/ACT nasal spray Place 1 spray into both nostrils daily as needed for allergies or rhinitis.   HYDROcodone-acetaminophen (NORCO) 5-325 MG tablet Take 1 tablet by mouth every 6 (six) hours as needed for moderate pain.   influenza vaccine adjuvanted (FLUAD QUADRIVALENT) 0.5 ML injection Inject into the muscle.   L-Arginine 1000 MG TABS Take 1,000 mg by mouth 3 (three) times a week.   Lifitegrast (XIIDRA) 5 % SOLN Place 1 drop into both eyes 2 (two) times daily.   Lutein 20 MG CAPS Take 20 mg by mouth daily.   Multiple Vitamin (  MULTIVITAMIN WITH MINERALS) TABS tablet Take 1 tablet by mouth in the morning.   Multiple Vitamins-Minerals (PRESERVISION AREDS 2 PO) Take 1 tablet by mouth in the morning.   niacin 500 MG tablet Take 500 mg by mouth in the morning.   tadalafil (CIALIS) 5 MG tablet Take 1 tablet (5 mg total) by mouth in the morning.   No facility-administered medications prior to visit.         Objective    BP 127/68 (BP Location: Left Arm, Patient Position: Sitting, Cuff Size: Normal)   Pulse 67   Temp 97.8 F (36.6 C) (Oral)   Wt 200 lb 4.8 oz (90.9 kg)   SpO2  95%   BMI 30.46 kg/m    Physical Exam    General: Appearance:    Overweight male in no acute distress  Eyes:    PERRL, conjunctiva/corneas clear, EOM's intact       Lungs:     Clear to auscultation bilaterally, respirations unlabored  Heart:    Normal heart rate. Normal rhythm. No murmurs, rubs, or gallops.    MS:   All extremities are intact.    Neurologic:   Awake, alert, oriented x 3. No apparent focal neurological defect.         Assessment & Plan     1. Mixed hyperlipidemia Intolerant to many different classes of cholesterol lower medications. Consider bempedoic acid. Continue current dietary treatments for the time being. He did have [facility] screening in February with negative AAA, carotid screening and PAD screening, however his CRP was elevated.   2. Vitamin D deficiency Lab Results  Component Value Date   VD25OH 44.0 11/23/2020     3. Hyperglycemia Continue to avoid sweets and starchy foods. Check a1c prior to next visit.   4. Compression fracture of L3 vertebra, initial encounter (HCC)  - DG Bone Density; Future  5. Closed compression fracture of L1 lumbar vertebra with routine healing, subsequent encounter   6. DDD (degenerative disc disease), lumbar       The entirety of the information documented in the History of Present Illness, Review of Systems and Physical Exam were personally obtained by me. Portions of this information were initially documented by the CMA and reviewed by me for thoroughness and accuracy.     Lelon Huh, MD  [facility] [phone] (phone) [phone] (fax)  [place]

## 2022-04-06 DIAGNOSIS — M47816 Spondylosis without myelopathy or radiculopathy, lumbar region: Secondary | ICD-10-CM | POA: Diagnosis not present

## 2022-04-06 DIAGNOSIS — M5136 Other intervertebral disc degeneration, lumbar region: Secondary | ICD-10-CM | POA: Diagnosis not present

## 2022-04-09 ENCOUNTER — Encounter: Payer: Self-pay | Admitting: Family Medicine

## 2022-04-09 ENCOUNTER — Ambulatory Visit (INDEPENDENT_AMBULATORY_CARE_PROVIDER_SITE_OTHER): Payer: Medicare Other | Admitting: Family Medicine

## 2022-04-09 VITALS — BP 127/68 | HR 67 | Temp 97.8°F | Wt 200.3 lb

## 2022-04-09 DIAGNOSIS — S32030A Wedge compression fracture of third lumbar vertebra, initial encounter for closed fracture: Secondary | ICD-10-CM

## 2022-04-09 DIAGNOSIS — M5136 Other intervertebral disc degeneration, lumbar region: Secondary | ICD-10-CM | POA: Diagnosis not present

## 2022-04-09 DIAGNOSIS — E782 Mixed hyperlipidemia: Secondary | ICD-10-CM

## 2022-04-09 DIAGNOSIS — S32010D Wedge compression fracture of first lumbar vertebra, subsequent encounter for fracture with routine healing: Secondary | ICD-10-CM

## 2022-04-09 DIAGNOSIS — R739 Hyperglycemia, unspecified: Secondary | ICD-10-CM | POA: Diagnosis not present

## 2022-04-09 DIAGNOSIS — M51369 Other intervertebral disc degeneration, lumbar region without mention of lumbar back pain or lower extremity pain: Secondary | ICD-10-CM

## 2022-04-09 DIAGNOSIS — E559 Vitamin D deficiency, unspecified: Secondary | ICD-10-CM

## 2022-04-09 NOTE — Patient Instructions (Signed)
Please review the attached list of medications and notify my office if there are any errors.   Please bring a copy of the labs done last week at the [place].

## 2022-04-11 DIAGNOSIS — S32010A Wedge compression fracture of first lumbar vertebra, initial encounter for closed fracture: Secondary | ICD-10-CM | POA: Insufficient documentation

## 2022-04-11 DIAGNOSIS — S32030A Wedge compression fracture of third lumbar vertebra, initial encounter for closed fracture: Secondary | ICD-10-CM | POA: Insufficient documentation

## 2022-04-17 ENCOUNTER — Encounter: Payer: Self-pay | Admitting: Family Medicine

## 2022-04-27 DIAGNOSIS — M47816 Spondylosis without myelopathy or radiculopathy, lumbar region: Secondary | ICD-10-CM | POA: Diagnosis not present

## 2022-05-16 DIAGNOSIS — D1801 Hemangioma of skin and subcutaneous tissue: Secondary | ICD-10-CM | POA: Diagnosis not present

## 2022-05-16 DIAGNOSIS — L570 Actinic keratosis: Secondary | ICD-10-CM | POA: Diagnosis not present

## 2022-05-24 DIAGNOSIS — H353131 Nonexudative age-related macular degeneration, bilateral, early dry stage: Secondary | ICD-10-CM | POA: Diagnosis not present

## 2022-06-06 ENCOUNTER — Ambulatory Visit
Admission: RE | Admit: 2022-06-06 | Discharge: 2022-06-06 | Disposition: A | Discharge status: Home / Self Care | Payer: Medicare Other | Source: Ambulatory Visit | Attending: Family Medicine | Admitting: Family Medicine

## 2022-06-06 DIAGNOSIS — M810 Age-related osteoporosis without current pathological fracture: Secondary | ICD-10-CM | POA: Diagnosis not present

## 2022-06-06 DIAGNOSIS — S32030A Wedge compression fracture of third lumbar vertebra, initial encounter for closed fracture: Secondary | ICD-10-CM | POA: Insufficient documentation

## 2022-06-08 DIAGNOSIS — M5136 Other intervertebral disc degeneration, lumbar region: Secondary | ICD-10-CM | POA: Diagnosis not present

## 2022-06-08 DIAGNOSIS — M48061 Spinal stenosis, lumbar region without neurogenic claudication: Secondary | ICD-10-CM | POA: Diagnosis not present

## 2022-06-08 DIAGNOSIS — M5126 Other intervertebral disc displacement, lumbar region: Secondary | ICD-10-CM | POA: Diagnosis not present

## 2022-06-08 DIAGNOSIS — M62830 Muscle spasm of back: Secondary | ICD-10-CM | POA: Diagnosis not present

## 2022-06-08 DIAGNOSIS — M5416 Radiculopathy, lumbar region: Secondary | ICD-10-CM | POA: Diagnosis not present

## 2022-06-19 ENCOUNTER — Encounter: Payer: Self-pay | Admitting: Family Medicine

## 2022-06-19 DIAGNOSIS — M810 Age-related osteoporosis without current pathological fracture: Secondary | ICD-10-CM | POA: Insufficient documentation

## 2022-06-20 ENCOUNTER — Other Ambulatory Visit: Payer: Self-pay | Admitting: Family Medicine

## 2022-06-21 ENCOUNTER — Other Ambulatory Visit: Payer: Self-pay

## 2022-06-21 DIAGNOSIS — M810 Age-related osteoporosis without current pathological fracture: Secondary | ICD-10-CM

## 2022-06-21 MED ORDER — ALENDRONATE SODIUM 70 MG PO TABS: 70.0000 mg | ORAL_TABLET | ORAL | 3 refills | Status: DC

## 2022-07-03 ENCOUNTER — Other Ambulatory Visit: Payer: Self-pay | Admitting: Family Medicine

## 2022-07-03 DIAGNOSIS — N529 Male erectile dysfunction, unspecified: Secondary | ICD-10-CM

## 2022-07-03 DIAGNOSIS — N400 Enlarged prostate without lower urinary tract symptoms: Secondary | ICD-10-CM

## 2022-08-09 DIAGNOSIS — L03312 Cellulitis of back [any part except buttock]: Secondary | ICD-10-CM | POA: Diagnosis not present

## 2022-08-09 DIAGNOSIS — L723 Sebaceous cyst: Secondary | ICD-10-CM | POA: Diagnosis not present

## 2022-09-18 DIAGNOSIS — L814 Other melanin hyperpigmentation: Secondary | ICD-10-CM | POA: Diagnosis not present

## 2022-09-18 DIAGNOSIS — D225 Melanocytic nevi of trunk: Secondary | ICD-10-CM | POA: Diagnosis not present

## 2022-09-18 DIAGNOSIS — Z85820 Personal history of malignant melanoma of skin: Secondary | ICD-10-CM | POA: Diagnosis not present

## 2022-09-18 DIAGNOSIS — L570 Actinic keratosis: Secondary | ICD-10-CM | POA: Diagnosis not present

## 2022-09-18 DIAGNOSIS — Z08 Encounter for follow-up examination after completed treatment for malignant neoplasm: Secondary | ICD-10-CM | POA: Diagnosis not present

## 2022-09-18 DIAGNOSIS — L298 Other pruritus: Secondary | ICD-10-CM | POA: Diagnosis not present

## 2022-09-18 DIAGNOSIS — L821 Other seborrheic keratosis: Secondary | ICD-10-CM | POA: Diagnosis not present

## 2022-09-20 NOTE — Progress Notes (Signed)
I,April Miller,acting as a scribe for Lelon Huh, MD.,have documented all relevant documentation on the behalf of Lelon Huh, MD,as directed by  Lelon Huh, MD while in the presence of Lelon Huh, MD.   Established patient visit   Patient: Larry Crawford   DOB: 1941-02-26   81 y.o. Male  MRN: [id number] Visit Date: 09/21/2022  Today's healthcare provider: Lelon Huh, MD   Chief Complaint  Patient presents with   Hypertension   Hyperglycemia   Subjective    HPI  Hyperglycemia, Follow-up  Lab Results  Component Value Date   HGBA1C 5.7 (H) 10/09/2021   HGBA1C 5.9 (H) 01/15/2018   HGBA1C 5.7 (H) 01/09/2017   GLUCOSE 101 (H) 10/09/2021   GLUCOSE 115 (H) 01/03/2021   GLUCOSE 94 11/23/2020    Last seen for for this 6 months ago.  Management since that visit includes advising patient to continue avoiding sweets and starchy foods.  Lab Results  Component Value Date   CHOL 262 (H) 10/09/2021   HDL 41 10/09/2021   LDLCALC 190 (H) 10/09/2021   TRIG 165 (H) 10/09/2021   CHOLHDL 6.4 (H) 10/09/2021   Also due for follow up lipids, intolerant to multiple statins, ezetimibe, cholestyramine, and Welchol.    Wt Readings from Last 3 Encounters:  09/21/22 196 lb (88.9 kg)  04/09/22 200 lb 4.8 oz (90.9 kg)  02/16/22 184 lb 11.9 oz (83.8 kg)   He also reports he has reduced allopurinol to 1/2 tablet daily and that he has not had any gout flares since reducing dose.  -----------------------------------------------------------------------------------------   Medications: Outpatient Medications Prior to Visit  Medication Sig   alendronate (FOSAMAX) 70 MG tablet Take 1 tablet (70 mg total) by mouth once a week. Take with a full glass of water on an empty stomach.   allopurinol (ZYLOPRIM) 300 MG tablet Take 1 tablet (300 mg total) by mouth in the morning. (Patient taking differently: Take 150 mg by mouth in the morning.)   cetirizine (ZYRTEC) 10 MG tablet Take 10 mg  by mouth in the morning.   Coenzyme Q10 (COQ10) 100 MG CAPS Take 100 mg by mouth in the morning.   Cyanocobalamin (VITAMIN B-12) 5000 MCG SUBL Place 5,000 mcg under the tongue in the morning.   DERMOTIC 0.01 % OIL Apply 1 application. topically See admin instructions. 1 application in the ears once or twice weekly   famotidine (PEPCID) 20 MG tablet Take 20 mg by mouth daily as needed for heartburn or indigestion.   fluticasone (FLONASE) 50 MCG/ACT nasal spray Place 1 spray into both nostrils daily as needed for allergies or rhinitis.   HYDROcodone-acetaminophen (NORCO) 5-325 MG tablet Take 1 tablet by mouth every 6 (six) hours as needed for moderate pain.   L-Arginine 1000 MG TABS Take 1,000 mg by mouth 3 (three) times a week.   Multiple Vitamin (MULTIVITAMIN WITH MINERALS) TABS tablet Take 1 tablet by mouth in the morning.   Multiple Vitamins-Minerals (PRESERVISION AREDS 2 PO) Take 1 tablet by mouth in the morning.   niacin 500 MG tablet Take 500 mg by mouth in the morning.   tadalafil (CIALIS) 5 MG tablet TAKE 1 TABLET IN THE MORNING   cimetidine (TAGAMET) 200 MG tablet Take 200 mg by mouth daily as needed (heartburn).   [DISCONTINUED] baclofen (LIORESAL) 10 MG tablet Take 10 mg by mouth 3 (three) times daily.   [DISCONTINUED] Lifitegrast (XIIDRA) 5 % SOLN Place 1 drop into both eyes 2 (two) times daily.   [  DISCONTINUED] Lutein 20 MG CAPS Take 20 mg by mouth daily.   No facility-administered medications prior to visit.    Review of Systems  Constitutional:  Negative for appetite change, chills and fever.  Respiratory:  Negative for chest tightness, shortness of breath and wheezing.   Cardiovascular:  Negative for chest pain and palpitations.  Gastrointestinal:  Negative for abdominal pain, nausea and vomiting.       Objective    BP 128/61 (BP Location: Right Arm, Patient Position: Sitting, Cuff Size: Large)   Pulse 65   Resp 16   Wt 196 lb (88.9 kg)   SpO2 99%   BMI 29.80 kg/m     Physical Exam   General: Appearance:    Well developed, well nourished male in no acute distress  Eyes:    PERRL, conjunctiva/corneas clear, EOM's intact       Lungs:     Clear to auscultation bilaterally, respirations unlabored  Heart:    Normal heart rate. Normal rhythm. No murmurs, rubs, or gallops.    MS:   All extremities are intact.    Neurologic:   Awake, alert, oriented x 3. No apparent focal neurological defect.         Assessment & Plan     1. Hyperglycemia  - Hemoglobin A1c  2. Vitamin D deficiency   3. Compression fracture of L3 vertebra, initial encounter ([place])   4. Overweight (BMI 25.0-29.9) He is interested in trying [facility] which is covered by [employer] if BMI>27 with comorbid weight related condition which he does have.   5. Mixed hyperlipidemia Intolerant to multiple classes of lipid medications. He is interested in trying Bempedoic acid.  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 Portions of this information were initially documented by the CMA and reviewed by me for thoroughness and accuracy.     Lelon Huh, MD  [facility] [phone] (phone) [phone] (fax)  [place]

## 2022-09-21 ENCOUNTER — Encounter: Payer: Self-pay | Admitting: Family Medicine

## 2022-09-21 ENCOUNTER — Ambulatory Visit (INDEPENDENT_AMBULATORY_CARE_PROVIDER_SITE_OTHER): Payer: Medicare Other | Admitting: Family Medicine

## 2022-09-21 VITALS — BP 128/61 | HR 65 | Resp 16 | Wt 196.0 lb

## 2022-09-21 DIAGNOSIS — E782 Mixed hyperlipidemia: Secondary | ICD-10-CM | POA: Diagnosis not present

## 2022-09-21 DIAGNOSIS — S32030A Wedge compression fracture of third lumbar vertebra, initial encounter for closed fracture: Secondary | ICD-10-CM | POA: Diagnosis not present

## 2022-09-21 DIAGNOSIS — R739 Hyperglycemia, unspecified: Secondary | ICD-10-CM | POA: Diagnosis not present

## 2022-09-21 DIAGNOSIS — M810 Age-related osteoporosis without current pathological fracture: Secondary | ICD-10-CM | POA: Diagnosis not present

## 2022-09-21 DIAGNOSIS — E663 Overweight: Secondary | ICD-10-CM | POA: Diagnosis not present

## 2022-09-21 DIAGNOSIS — S32010D Wedge compression fracture of first lumbar vertebra, subsequent encounter for fracture with routine healing: Secondary | ICD-10-CM | POA: Diagnosis not present

## 2022-09-21 DIAGNOSIS — E559 Vitamin D deficiency, unspecified: Secondary | ICD-10-CM

## 2022-09-22 LAB — LIPID PANEL
Chol/HDL Ratio: 7.6 ratio — ABNORMAL HIGH (ref 0.0–5.0)
Cholesterol, Total: 304 mg/dL — ABNORMAL HIGH (ref 100–199)
HDL: 40 mg/dL (ref >39)
LDL Chol Calc (NIH): 214 mg/dL — ABNORMAL HIGH (ref 0–99)
Triglycerides: 250 mg/dL — ABNORMAL HIGH (ref 0–149)
VLDL Cholesterol Cal: 50 mg/dL — ABNORMAL HIGH (ref 5–40)

## 2022-09-22 LAB — HEMOGLOBIN A1C
Est. average glucose Bld gHb Est-mCnc: 131 mg/dL
Hgb A1c MFr Bld: 6.2 % — ABNORMAL HIGH (ref 4.8–5.6)

## 2022-09-22 LAB — VITAMIN D 25 HYDROXY (VIT D DEFICIENCY, FRACTURES): Vit D, 25-Hydroxy: 46.6 ng/mL (ref 30.0–100.0)

## 2022-09-22 LAB — TSH: TSH: 1.57 u[IU]/mL (ref 0.450–4.500)

## 2022-09-24 ENCOUNTER — Other Ambulatory Visit: Payer: Self-pay | Admitting: Family Medicine

## 2022-09-24 DIAGNOSIS — E782 Mixed hyperlipidemia: Secondary | ICD-10-CM

## 2022-09-24 MED ORDER — BEMPEDOIC ACID 180 MG PO TABS: 180.0000 mg | ORAL_TABLET | Freq: Every day | ORAL | 1 refills | Status: DC

## 2022-10-08 DIAGNOSIS — K219 Gastro-esophageal reflux disease without esophagitis: Secondary | ICD-10-CM | POA: Diagnosis not present

## 2022-10-08 DIAGNOSIS — R1314 Dysphagia, pharyngoesophageal phase: Secondary | ICD-10-CM | POA: Diagnosis not present

## 2022-10-17 ENCOUNTER — Other Ambulatory Visit: Payer: Self-pay | Admitting: Family Medicine

## 2022-10-17 DIAGNOSIS — N529 Male erectile dysfunction, unspecified: Secondary | ICD-10-CM

## 2022-10-17 DIAGNOSIS — N400 Enlarged prostate without lower urinary tract symptoms: Secondary | ICD-10-CM

## 2022-10-17 MED ORDER — TADALAFIL 5 MG PO TABS: 5.0000 mg | ORAL_TABLET | Freq: Every morning | ORAL | 3 refills | Status: DC

## 2022-10-17 NOTE — Telephone Encounter (Signed)
Medication Refill - Medication: tadalafil (CIALIS) 5 MG tablet   Has the patient contacted their pharmacy? Yes.     Preferred Pharmacy (with phone number or street name):  [place] [place] Phone: [phone]  Fax: [phone]     Has the patient been seen for an appointment in the last year OR does the patient have an upcoming appointment? Yes.    This medication requires a prior authorization to be sent to [phone]. The patient takes this for urinary function and says it helps him a lot. Please assist patient further

## 2022-10-22 ENCOUNTER — Ambulatory Visit (INDEPENDENT_AMBULATORY_CARE_PROVIDER_SITE_OTHER): Payer: Medicare Other

## 2022-10-22 ENCOUNTER — Telehealth: Payer: Self-pay | Admitting: Family Medicine

## 2022-10-22 VITALS — BP 138/62 | Ht 68.0 in | Wt 199.0 lb

## 2022-10-22 DIAGNOSIS — Z0000 Encounter for general adult medical examination without abnormal findings: Secondary | ICD-10-CM | POA: Diagnosis not present

## 2022-10-22 DIAGNOSIS — Z789 Other specified health status: Secondary | ICD-10-CM

## 2022-10-22 DIAGNOSIS — E782 Mixed hyperlipidemia: Secondary | ICD-10-CM

## 2022-10-22 NOTE — Telephone Encounter (Signed)
Patient got a notice in the mail saying his Tadalafil 5 mg. Cannot be refilled without his doctor contacting [facility] to discuss "how it will work safely" because there is a contradiction with another med he takes.  He would like for you to contact [facility] to get this solved so he can get Tadalafil.     Also [facility] will not cover Nexletol because usually this medication is typically presribed by a Cardiologist, endocrinologist or lipidologist,   he said if he needed to be referred to a cardiologist, he would gladly go so he can get his medication.  Please contact patient when these issues have been resolved.

## 2022-10-22 NOTE — Telephone Encounter (Signed)
PA forms for tadalfinil were completed last week and sent to medication records to be faxed to [facility].  Have placed referral order to cardiology.

## 2022-10-22 NOTE — Telephone Encounter (Signed)
Please advise 

## 2022-10-22 NOTE — Addendum Note (Signed)
Addended by: Lelon Huh E on: 10/22/2022 12:44 PM   Modules accepted: Orders

## 2022-10-22 NOTE — Patient Instructions (Signed)
Larry Crawford , Thank you for taking time to come for your Medicare Wellness Visit. I appreciate your ongoing commitment to your health goals. Please review the following plan we discussed and let me know if I can assist you in the future.   Screening recommendations/referrals: Colonoscopy: aged out Recommended yearly ophthalmology/optometry visit for glaucoma screening and checkup Recommended yearly dental visit for hygiene and checkup  Vaccinations: Influenza vaccine: 09/01/21 Pneumococcal vaccine: 05/11/14 Tdap vaccine: 12/27/17 Shingles vaccine: Zostavax 02/05/13 Shingrix 02/09/22, need 2nd shot   Covid-19: 11/23/19, 12/14/19, 08/22/20, 07/31/21  Advanced directives: no  Conditions/risks identified: none  Next appointment: Follow up in one year for your annual wellness visit. 10/24/23 @ 8:45 am in person  Preventive Care 65 Years and Older, Male Preventive care refers to lifestyle choices and visits with your health care provider that can promote health and wellness. What does preventive care include? A yearly physical exam. This is also called an annual well check. Dental exams once or twice a year. Routine eye exams. Ask your health care provider how often you should have your eyes checked. Personal lifestyle choices, including: Daily care of your teeth and gums. Regular physical activity. Eating a healthy diet. Avoiding tobacco and drug use. Limiting alcohol use. Practicing safe sex. Taking low doses of aspirin every day. Taking vitamin and mineral supplements as recommended by your health care provider. What happens during an annual well check? The services and screenings done by your health care provider during your annual well check will depend on your age, overall health, lifestyle risk factors, and family history of disease. Counseling  Your health care provider may ask you questions about your: Alcohol use. Tobacco use. Drug use. Emotional well-being. Home and relationship  well-being. Sexual activity. Eating habits. History of falls. Memory and ability to understand (cognition). Work and work Statistician. Screening  You may have the following tests or measurements: Height, weight, and BMI. Blood pressure. Lipid and cholesterol levels. These may be checked every 5 years, or more frequently if you are over 69 years old. Skin check. Lung cancer screening. You may have this screening every year starting at age 82. if you have a 30-pack-year history of smoking and currently smoke or have quit within the past 15 years. Fecal occult blood test (FOBT) of the stool. You may have this test every year starting at age 82. Flexible sigmoidoscopy or colonoscopy. You may have a sigmoidoscopy every 5 years or a colonoscopy every 10 years starting at age 82. Prostate cancer screening. Recommendations will vary depending on your family history and other risks. Hepatitis C blood test. Hepatitis B blood test. Sexually transmitted disease (STD) testing. Diabetes screening. This is done by checking your blood sugar (glucose) after you have not eaten for a while (fasting). You may have this done every 1-3 years. Abdominal aortic aneurysm (AAA) screening. You may need this if you are a current or former smoker. Osteoporosis. You may be screened starting at age 82 if you are at high risk. Talk with your health care provider about your test results, treatment options, and if necessary, the need for more tests. Vaccines  Your health care provider may recommend certain vaccines, such as: Influenza vaccine. This is recommended every year. Tetanus, diphtheria, and acellular pertussis (Tdap, Td) vaccine. You may need a Td booster every 10 years. Zoster vaccine. You may need this after age 53. Pneumococcal 13-valent conjugate (PCV13) vaccine. One dose is recommended after age 48. Pneumococcal polysaccharide (PPSV23) vaccine. One dose is recommended  after age 92. Talk to your health care  provider about which screenings and vaccines you need and how often you need them. This information is not intended to replace advice given to you by your health care provider. Make sure you discuss any questions you have with your health care provider. Document Released: 12/02/2015 Document Revised: 07/25/2016 Document Reviewed: 09/06/2015 Elsevier Interactive Patient Education  2017 [place] Prevention in the Home Falls can cause injuries. They can happen to people of all ages. There are many things you can do to make your home safe and to help prevent falls. What can I do on the outside of my home? Regularly fix the edges of walkways and driveways and fix any cracks. Remove anything that might make you trip as you walk through a door, such as a raised step or threshold. Trim any bushes or trees on the path to your home. Use bright outdoor lighting. Clear any walking paths of anything that might make someone trip, such as rocks or tools. Regularly check to see if handrails are loose or broken. Make sure that both sides of any steps have handrails. Any raised decks and porches should have guardrails on the edges. Have any leaves, snow, or ice cleared regularly. Use sand or salt on walking paths during winter. Clean up any spills in your garage right away. This includes oil or grease spills. What can I do in the bathroom? Use night lights. Install grab bars by the toilet and in the tub and shower. Do not use towel bars as grab bars. Use non-skid mats or decals in the tub or shower. If you need to sit down in the shower, use a plastic, non-slip stool. Keep the floor dry. Clean up any water that spills on the floor as soon as it happens. Remove soap buildup in the tub or shower regularly. Attach bath mats securely with double-sided non-slip rug tape. Do not have throw rugs and other things on the floor that can make you trip. What can I do in the bedroom? Use night lights. Make  sure that you have a light by your bed that is easy to reach. Do not use any sheets or blankets that are too big for your bed. They should not hang down onto the floor. Have a firm chair that has side arms. You can use this for support while you get dressed. Do not have throw rugs and other things on the floor that can make you trip. What can I do in the kitchen? Clean up any spills right away. Avoid walking on wet floors. Keep items that you use a lot in easy-to-reach places. If you need to reach something above you, use a strong step stool that has a grab bar. Keep electrical cords out of the way. Do not use floor polish or wax that makes floors slippery. If you must use wax, use non-skid floor wax. Do not have throw rugs and other things on the floor that can make you trip. What can I do with my stairs? Do not leave any items on the stairs. Make sure that there are handrails on both sides of the stairs and use them. Fix handrails that are broken or loose. Make sure that handrails are as long as the stairways. Check any carpeting to make sure that it is firmly attached to the stairs. Fix any carpet that is loose or worn. Avoid having throw rugs at the top or bottom of the stairs. If you  do have throw rugs, attach them to the floor with carpet tape. Make sure that you have a light switch at the top of the stairs and the bottom of the stairs. If you do not have them, ask someone to add them for you. What else can I do to help prevent falls? Wear shoes that: Do not have high heels. Have rubber bottoms. Are comfortable and fit you well. Are closed at the toe. Do not wear sandals. If you use a stepladder: Make sure that it is fully opened. Do not climb a closed stepladder. Make sure that both sides of the stepladder are locked into place. Ask someone to hold it for you, if possible. Clearly mark and make sure that you can see: Any grab bars or handrails. First and last steps. Where the  edge of each step is. Use tools that help you move around (mobility aids) if they are needed. These include: Canes. Walkers. Scooters. Crutches. Turn on the lights when you go into a dark area. Replace any light bulbs as soon as they burn out. Set up your furniture so you have a clear path. Avoid moving your furniture around. If any of your floors are uneven, fix them. If there are any pets around you, be aware of where they are. Review your medicines with your doctor. Some medicines can make you feel dizzy. This can increase your chance of falling. Ask your doctor what other things that you can do to help prevent falls. This information is not intended to replace advice given to you by your health care provider. Make sure you discuss any questions you have with your health care provider. Document Released: 09/01/2009 Document Revised: 04/12/2016 Document Reviewed: 12/10/2014 Elsevier Interactive Patient Education  2017 [employer].

## 2022-10-22 NOTE — Progress Notes (Signed)
Subjective:   Larry Crawford is a 81 y.o. male who presents for Medicare Annual/Subsequent preventive examination.  Review of Systems           Objective:    There were no vitals filed for this visit. There is no height or weight on file to calculate BMI.     02/16/2022    8:23 AM 01/10/2021    9:25 AM 01/03/2021   10:33 AM 09/06/2020    8:40 AM 06/18/2019   10:19 AM 01/10/2018    7:49 AM 01/03/2018    7:24 AM  Advanced Directives  Does Patient Have a Medical Advance Directive? Yes No Yes Yes Yes No No  Type of Advance Directive Living will;Healthcare Power of Time;Living will [place];Living will [place];Living will    Does patient want to make changes to medical advance directive?  No - Patient declined No - Patient declined  No - Patient declined    Copy of [place] in Chart? No - copy requested  No - copy requested No - copy requested No - copy requested    Would patient like information on creating a medical advance directive?  No - Patient declined    No - Patient declined No - Patient declined    Current Medications (verified) Outpatient Encounter Medications as of 10/22/2022  Medication Sig   alendronate (FOSAMAX) 70 MG tablet Take 1 tablet (70 mg total) by mouth once a week. Take with a full glass of water on an empty stomach.   allopurinol (ZYLOPRIM) 300 MG tablet Take 1 tablet (300 mg total) by mouth in the morning. (Patient taking differently: Take 150 mg by mouth in the morning.)   Bempedoic Acid 180 MG TABS Take 180 mg by mouth daily.   cetirizine (ZYRTEC) 10 MG tablet Take 10 mg by mouth in the morning.   cimetidine (TAGAMET) 200 MG tablet Take 200 mg by mouth daily as needed (heartburn).   Coenzyme Q10 (COQ10) 100 MG CAPS Take 100 mg by mouth in the morning.   Cyanocobalamin (VITAMIN B-12) 5000 MCG SUBL Place 5,000 mcg under the tongue in the morning.   DERMOTIC 0.01 %  OIL Apply 1 application. topically See admin instructions. 1 application in the ears once or twice weekly   famotidine (PEPCID) 20 MG tablet Take 20 mg by mouth daily as needed for heartburn or indigestion.   fluticasone (FLONASE) 50 MCG/ACT nasal spray Place 1 spray into both nostrils daily as needed for allergies or rhinitis.   HYDROcodone-acetaminophen (NORCO) 5-325 MG tablet Take 1 tablet by mouth every 6 (six) hours as needed for moderate pain.   L-Arginine 1000 MG TABS Take 1,000 mg by mouth 3 (three) times a week.   Multiple Vitamin (MULTIVITAMIN WITH MINERALS) TABS tablet Take 1 tablet by mouth in the morning.   Multiple Vitamins-Minerals (PRESERVISION AREDS 2 PO) Take 1 tablet by mouth in the morning.   niacin 500 MG tablet Take 500 mg by mouth in the morning.   tadalafil (CIALIS) 5 MG tablet Take 1 tablet (5 mg total) by mouth every morning.   No facility-administered encounter medications on file as of 10/22/2022.    Allergies (verified) Tetracyclines & related, Colestipol hcl, Corn-containing products, Pravastatin sodium, Shellfish allergy, Sulfa antibiotics, Welchol [colesevelam], and Zetia [ezetimibe]   History: Past Medical History:  Diagnosis Date   BPH (benign prostatic hypertrophy)    Colon polyp    Elevated  red blood cell count    Erectile dysfunction    Gout    History of chicken pox    History of measles    History of mumps    IBS (irritable bowel syndrome)    MRSA infection 2000   nasal   Skin cancer (melanoma) ([place]) 2021   left arm   Past Surgical History:  Procedure Laterality Date   aortic ultrasound  2013   normal. Screening ultrasound [facility] per pt report   BCC Excised from chest  1984   BUNIONECTOMY Right    great toe   CATARACT EXTRACTION, [place]  [phone]   2 on right/1 on left   IR KYPHO LUMBAR INC FX REDUCE BONE BX UNI/BIL CANNULATION INC/IMAGING  02/16/2022   IR  RADIOLOGIST EVAL & MGMT  01/30/2022   IR RADIOLOGIST EVAL & MGMT  02/27/2022   KYPHOPLASTY N/A 04/03/2021   Procedure: L1 KYPHOPLASTY AND BONE BIOPDY;  Surgeon: Hessie Knows, MD;  Location: [facility] ORS;  Service: Orthopedics;  Laterality: N/A;   LIPOMA EXCISION  2013   NOSE SURGERY     divided septum   PARTIAL KNEE ARTHROPLASTY Right 01/10/2021   Procedure: UNICOMPARTMENTAL KNEE;  Surgeon: Corky Mull, MD;  Location: [facility] ORS;  Service: Orthopedics;  Laterality: Right;   PENILE PROSTHESIS IMPLANT  12/17/2013   POLYPECTOMY     PROSTATE SURGERY     Rezum procedure done in office   [place] ARTHROSCOPY  1984 and 1987   left X2 for spurs   TONSILLECTOMY AND ADENOIDECTOMY  9381   UMBILICAL HERNIA REPAIR     Family History  Problem Relation Age of Onset   Hypothyroidism Mother    Diabetes Brother    Obesity Brother    Cancer Brother        throat   Social History   Socioeconomic History   Marital status: Married    Spouse name: Freda Munro   Number of children: 4   Years of education: Not on file   Highest education level: 12th grade  Occupational History   Occupation: retired  Tobacco Use   Smoking status: Former    Packs/day: 0.75    Years: 5.00    Total pack years: 3.75    Types: Cigarettes    Quit date: 11/19/1958    Years since quitting: 63.9   Smokeless tobacco: Never  Vaping Use   Vaping Use: Never used  Substance and Sexual Activity   Alcohol use: Yes    Alcohol/week: 0.0 standard drinks of alcohol    Comment: 2 glasses of wine a month   Drug use: No   Sexual activity: Not on file  Other Topics Concern   Not on file  Social History Narrative   Pt went to [place] through the [employer].    Social Determinants of Health   Financial Resource Strain: Low Risk  (09/06/2020)   Overall Financial Resource Strain (CARDIA)    Difficulty of Paying Living Expenses: Not hard at all  Food Insecurity: No Food Insecurity (09/06/2020)   Hunger Vital Sign     Worried About Running Out of Food in the Last Year: Never true    Ran Out of Food in the Last Year: Never true  Transportation Needs: No Transportation Needs (09/06/2020)   PRAPARE - Hydrologist (Medical): No  Lack of Transportation (Non-Medical): No  Physical Activity: Inactive (09/06/2020)   Exercise Vital Sign    Days of Exercise per Week: 0 days    Minutes of Exercise per Session: 0 min  Stress: No Stress Concern Present (09/06/2020)   [place]    Feeling of Stress : Not at all  Social Connections: Moderately Isolated (09/06/2020)   Social Connection and Isolation Panel [NHANES]    Frequency of Communication with Friends and Family: Once a week    Frequency of Social Gatherings with Friends and Family: Three times a week    Attends Religious Services: Never    Active Member of Clubs or Organizations: No    Attends Music therapist: Never    Marital Status: Married    Tobacco Counseling Counseling given: Not Answered   Clinical Intake:  Pre-visit preparation completed: Yes  Pain : No/denies pain     Nutritional Risks: None Diabetes: No  How often do you need to have someone help you when you read instructions, pamphlets, or other written materials from your doctor or pharmacy?: 1 - Never  Diabetic?no  Interpreter Needed?: No  Information entered by :: Kirke Shaggy LPN   Activities of Daily Living    09/21/2022    9:33 AM 04/09/2022    8:36 AM  In your present state of health, do you have any difficulty performing the following activities:  Hearing? 0 1  Vision? 0 0  Difficulty concentrating or making decisions? 0 1  Walking or climbing stairs? 0 0  Dressing or bathing? 0 0  Doing errands, shopping? 0 0    Patient Care Team: Birdie Sons, MD as PCP - General (Family Medicine) Lorelee Cover., MD as Consulting Physician  (Ophthalmology) Lucas Mallow, MD as Consulting Physician (Urology) Poggi, Marshall Cork, MD as Consulting Physician (Orthopedic Surgery) Sharlet Salina, MD as Referring Physician (Physical Medicine and Rehabilitation)  Indicate any recent Medical Services you may have received from other than [facility] providers in the past year (date may be approximate).     Assessment:   This is a routine wellness examination for Cam.  Hearing/Vision screen No results found.  Dietary issues and exercise activities discussed:     Goals Addressed   None    Depression Screen    09/21/2022    9:32 AM 04/09/2022    8:35 AM 10/09/2021    8:55 AM 11/23/2020   10:04 AM 09/06/2020    8:36 AM 08/25/2019    9:22 AM 11/26/2017    2:32 PM  PHQ 2/9 Scores  PHQ - 2 Score '4 3 2 1 '$ 0 0 0  PHQ- 9 Score '13 12 8 8  3     '$ Fall Risk    09/21/2022    9:33 AM 04/09/2022    8:35 AM 10/09/2021    8:54 AM 10/05/2021    8:44 AM 11/23/2020   10:06 AM  Fall Risk   Falls in the past year? '1 1 1 1 '$ 0  Number falls in past yr: 1 0 0 0 0  Injury with Fall? '1 1 1  '$ 0  Risk for fall due to : History of fall(s)  History of fall(s)    Follow up Falls evaluation completed Falls evaluation completed   Falls evaluation completed    [place]:  Any stairs in or around the home? No  If so, are there any without  handrails? No  Home free of loose throw rugs in walkways, pet beds, electrical cords, etc? Yes  Adequate lighting in your home to reduce risk of falls? Yes   ASSISTIVE DEVICES UTILIZED TO PREVENT FALLS:  Life alert? No  Use of a cane, walker or w/c? No  Grab bars in the bathroom? No  Shower chair or bench in shower? No  Elevated toilet seat or a handicapped toilet? No   TIMED UP AND GO:  Was the test performed? Yes .  Length of time to ambulate 10 feet: 4 sec.   Gait steady and fast without use of assistive device  Cognitive Function:        11/20/2016    3:14 PM  6CIT  Screen  What Year? 0 points  What month? 0 points  What time? 0 points  Count back from 20 0 points  Months in reverse 0 points  Repeat phrase 2 points  Total Score 2 points    Immunizations Immunization History  Administered Date(s) Administered   Fluad Quad(high Dose 65+) 08/05/2019, 09/01/2021   Influenza, High Dose Seasonal PF 09/28/2015, 07/27/2017, 08/14/2018, 08/18/2020, 08/07/2022   Influenza-Unspecified 09/01/2021   Moderna Covid-19 Vaccine Bivalent Booster 7yr & up 07/31/2021   PFIZER(Purple Top)SARS-COV-2 Vaccination 11/23/2019, 12/14/2019, 08/22/2020   Pneumococcal Conjugate-13 05/11/2014   Pneumococcal Polysaccharide-23 07/29/2005   Rabies, IM 12/27/2017, 12/30/2017, 01/03/2018, 01/10/2018   Tdap 08/25/2014, 12/27/2017   Zoster Recombinat (Shingrix) 02/09/2022   Zoster, Live 02/05/2013    TDAP status: Up to date  Flu Vaccine status: Up to date  Pneumococcal vaccine status: Up to date  Covid-19 vaccine status: Completed vaccines  Qualifies for Shingles Vaccine? Yes   Zostavax completed Yes   Shingrix Completed?: No.    Education has been provided regarding the importance of this vaccine. Patient has been advised to call insurance company to determine out of pocket expense if they have not yet received this vaccine. Advised may also receive vaccine at local pharmacy or Health Dept. Verbalized acceptance and understanding.  Screening Tests Health Maintenance  Topic Date Due   Zoster Vaccines- Shingrix (2 of 2) 04/06/2022   COVID-19 Vaccine (5 - 2023-24 season) 07/20/2022   Medicare Annual Wellness (AWV)  10/23/2023   DEXA SCAN  06/06/2024   DTaP/Tdap/Td (3 - Td or Tdap) 12/28/2027   Pneumonia Vaccine 81 Years old  Completed   INFLUENZA VACCINE  Completed   HPV VACCINES  Aged Out   COLONOSCOPY (Pts [phone] coverage will need to be confirmed)  Discontinued    Health Maintenance  Health Maintenance Due  Topic Date Due   Zoster Vaccines-  Shingrix (2 of 2) 04/06/2022   COVID-19 Vaccine (5 - 2023-24 season) 07/20/2022    Colorectal cancer screening: No longer required.   Lung Cancer Screening: (Low Dose CT Chest recommended if Age 82-80ears, 30 pack-year currently smoking OR have quit w/in 15years.) does not qualify.   Additional Screening:  Hepatitis C Screening: does not qualify; Completed no  Vision Screening: Recommended annual ophthalmology exams for early detection of glaucoma and other disorders of the eye. Is the patient up to date with their annual eye exam?  Yes  Who is the provider or what is the name of the office in which the patient attends annual eye exams? Dr.Bell If pt is not established with a provider, would they like to be referred to a provider to establish care? No .   Dental Screening: Recommended annual dental exams for proper oral hygiene  Community Resource Referral / Chronic Care Management: CRR required this visit?  No   CCM required this visit?  No      Plan:     I have personally reviewed and noted the following in the patient's chart:   Medical and social history Use of alcohol, tobacco or illicit drugs  Current medications and supplements including opioid prescriptions. Patient is currently taking opioid prescriptions. Information provided to patient regarding non-opioid alternatives. Patient advised to discuss non-opioid treatment plan with their provider. Functional ability and status Nutritional status Physical activity Advanced directives List of other physicians Hospitalizations, surgeries, and ER visits in previous 12 months Vitals Screenings to include cognitive, depression, and falls Referrals and appointments  In addition, I have reviewed and discussed with patient certain preventive protocols, quality metrics, and best practice recommendations. A written personalized care plan for preventive services as well as general preventive health recommendations were  provided to patient.     Dionisio David, LPN   [id number]   Nurse Notes: none

## 2022-10-29 DIAGNOSIS — M5416 Radiculopathy, lumbar region: Secondary | ICD-10-CM | POA: Diagnosis not present

## 2022-10-29 DIAGNOSIS — M5380 Other specified dorsopathies, site unspecified: Secondary | ICD-10-CM | POA: Diagnosis not present

## 2022-10-29 DIAGNOSIS — M7918 Myalgia, other site: Secondary | ICD-10-CM | POA: Diagnosis not present

## 2022-10-29 DIAGNOSIS — M5126 Other intervertebral disc displacement, lumbar region: Secondary | ICD-10-CM | POA: Diagnosis not present

## 2022-10-29 DIAGNOSIS — M5136 Other intervertebral disc degeneration, lumbar region: Secondary | ICD-10-CM | POA: Diagnosis not present

## 2022-10-29 DIAGNOSIS — M48061 Spinal stenosis, lumbar region without neurogenic claudication: Secondary | ICD-10-CM | POA: Diagnosis not present

## 2022-11-01 ENCOUNTER — Encounter: Payer: Self-pay | Admitting: Cardiology

## 2022-11-01 ENCOUNTER — Ambulatory Visit: Payer: Medicare Other | Attending: Cardiology | Admitting: Cardiology

## 2022-11-01 VITALS — BP 130/70 | HR 68 | Ht 68.0 in | Wt 202.2 lb

## 2022-11-01 DIAGNOSIS — E782 Mixed hyperlipidemia: Secondary | ICD-10-CM | POA: Diagnosis not present

## 2022-11-01 MED ORDER — REPATHA SURECLICK 140 MG/ML ~~LOC~~ SOAJ: 1.0000 | SUBCUTANEOUS | 3 refills | Status: DC

## 2022-11-01 MED ORDER — BEMPEDOIC ACID 180 MG PO TABS: 180.0000 mg | ORAL_TABLET | Freq: Every day | ORAL | 1 refills | Status: DC

## 2022-11-01 NOTE — Progress Notes (Signed)
Cardiology Office Note:    Date:  11/01/2022   ID:  Larry Crawford, DOB [birth year], MRN [id number]  PCP:  Birdie Sons, MD   [place] [facility] Cardiologist:  Kate Sable, MD     Referring MD: Birdie Sons, MD   Chief Complaint  Patient presents with   Mixed Hyperlipidemia and Stating Intolerance    Patient states that he is feeling pretty good today. Meds reviewed with patient.     History of Present Illness:    Larry Crawford is a 81 y.o. male with a hx of hyperlipidemia who presents for elevated cholesterol.  He has tried several medications did not tolerate including pravastatin causing muscle aches, Zetia.  Currently on bempedoic acid but not relieved by pharmacy stating he needed to see a cardiologist.  He overall feels okay, denies chest pain or shortness of breath.  Denies any history of heart disease.  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 Screening ultrasound at [facility] per pt report   BCC Excised from chest  1984   BUNIONECTOMY Right    great toe   CATARACT EXTRACTION, [place]  [phone]   2 on right/1 on left   IR KYPHO LUMBAR INC FX REDUCE BONE BX UNI/BIL CANNULATION INC/IMAGING  02/16/2022   IR RADIOLOGIST EVAL & MGMT  01/30/2022   IR RADIOLOGIST EVAL & MGMT  02/27/2022   KYPHOPLASTY N/A 04/03/2021   Procedure: L1 KYPHOPLASTY AND BONE BIOPDY;  Surgeon: Hessie Knows, MD;  Location: [facility] ORS;  Service: Orthopedics;  Laterality: N/A;   LIPOMA EXCISION  2013   NOSE SURGERY      divided septum   PARTIAL KNEE ARTHROPLASTY Right 01/10/2021   Procedure: UNICOMPARTMENTAL KNEE;  Surgeon: Corky Mull, MD;  Location: [facility] ORS;  Service: Orthopedics;  Laterality: Right;   PENILE PROSTHESIS IMPLANT  12/17/2013   POLYPECTOMY     PROSTATE SURGERY     Rezum procedure done in office   [place]   left X2 for spurs   TONSILLECTOMY AND ADENOIDECTOMY  [id number]   UMBILICAL HERNIA REPAIR      Current Medications: Current Meds  Medication Sig   alendronate (FOSAMAX) 70 MG tablet Take 1 tablet (70 mg total) by mouth once a week. Take with a full glass of water on an empty stomach.   allopurinol (ZYLOPRIM) 300 MG tablet Take 1 tablet (300 mg total) by mouth in the morning. (Patient taking differently: Take 150 mg by mouth in the morning.)   cetirizine (ZYRTEC) 10 MG tablet Take 10 mg by mouth in the morning.   Coenzyme Q10 (COQ10) 100 MG CAPS Take 100 mg by mouth in the morning.   Cyanocobalamin (VITAMIN B-12) 5000 MCG SUBL Place 5,000 mcg under the tongue in the morning.   [place]  0.01 % OIL Apply 1 application  topically See admin instructions. 1 application in the ears once or twice weekly   Evolocumab (REPATHA SURECLICK) 342 MG/ML SOAJ Inject 140 mg into the skin every 14 (fourteen) days.   fluticasone (FLONASE) 50 MCG/ACT nasal spray Place 1 spray into both nostrils daily as needed for allergies or rhinitis.   L-Arginine 1000 MG TABS Take 1,000 mg by mouth 3 (three) times a week.   Multiple Vitamin (MULTIVITAMIN WITH MINERALS) TABS tablet Take 1 tablet by mouth in the morning.   Multiple Vitamins-Minerals (PRESERVISION AREDS 2 PO) Take 1 tablet by mouth in the morning.   tadalafil (CIALIS) 5 MG tablet Take 1 tablet (5 mg total) by mouth every morning.     Allergies:   Tetracyclines & related, Colestipol hcl, Corn-containing products, Pravastatin sodium, Shellfish allergy, Sulfa antibiotics, Welchol [colesevelam], and Zetia [ezetimibe]    Social History   Socioeconomic History   Marital status: Married    Spouse name: Freda Munro   Number of children: 4   Years of education: Not on file   Highest education level: 12th grade  Occupational History   Occupation: retired  Tobacco Use   Smoking status: Former    Packs/day: 0.75    Years: 5.00    Total pack years: 3.75    Types: Cigarettes    Quit date: 11/19/1958    Years since quitting: 63.9   Smokeless tobacco: Never  Vaping Use   Vaping Use: Never used  Substance and Sexual Activity   Alcohol use: Yes    Alcohol/week: 0.0 standard drinks of alcohol    Comment: 2 glasses of wine a month   Drug use: No   Sexual activity: Not on file  Other Topics Concern   Not on file  Social History Narrative   Pt went to [employer] through the [employer].    Social Determinants of Health   Financial Resource Strain: Low Risk  (10/22/2022)   Overall Financial Resource Strain (CARDIA)    Difficulty of Paying Living Expenses: Not hard at all  Food Insecurity: No Food Insecurity (10/22/2022)   Hunger Vital Sign    Worried About Running Out of Food in the Last Year: Never true    Ran Out of Food in the Last Year: Never true  Transportation Needs: No Transportation Needs (10/22/2022)   PRAPARE - Hydrologist (Medical): No    Lack of Transportation (Non-Medical): No  Physical Activity: Insufficiently Active (10/22/2022)   Exercise Vital Sign    Days of Exercise per Week: 2 days    Minutes of Exercise per Session: 20 min  Stress: No Stress Concern Present (10/22/2022)   [place]    Feeling of Stress : Only a little  Social Connections: Socially Isolated (10/22/2022)   Social Connection and Isolation Panel [NHANES]    Frequency of Communication with Friends and Family: Twice a week    Frequency of Social Gatherings with Friends and Family: Never    Attends Religious Services: Never     Marine scientist or Organizations: No    Attends Music therapist: Never    Marital Status: Married     Family History: The patient's family history includes Cancer in his brother; Diabetes in his brother; Hypothyroidism in his mother; Obesity in his brother.  ROS:   Please see the history of present illness.     All other systems reviewed and  are negative.  EKGs/Labs/Other Studies Reviewed:    The following studies were reviewed today:   EKG:  EKG is  ordered today.  The ekg ordered today demonstrates normal sinus rhythm, right bundle branch block  Recent Labs: 02/16/2022: Platelets 377 03/30/2022: BUN 26; Creatinine 1.3; Hemoglobin 13.6; Potassium 4.2; Sodium 133 09/21/2022: TSH 1.570  Recent Lipid Panel    Component Value Date/Time   CHOL 304 (H) 09/21/2022 1050   TRIG 250 (H) 09/21/2022 1050   HDL 40 09/21/2022 1050   CHOLHDL 7.6 (H) 09/21/2022 1050   LDLCALC 214 (H) 09/21/2022 1050     Risk Assessment/Calculations:             Physical Exam:    VS:  BP 130/70 (BP Location: Right Arm, Patient Position: Sitting, Cuff Size: Normal)   Pulse 68   Ht '5\' 8"'$  (1.727 m)   Wt 202 lb 3.2 oz (91.7 kg)   SpO2 99%   BMI 30.74 kg/m     Wt Readings from Last 3 Encounters:  11/01/22 202 lb 3.2 oz (91.7 kg)  10/22/22 199 lb (90.3 kg)  09/21/22 196 lb (88.9 kg)     GEN:  Well nourished, well developed in no acute distress HEENT: Normal NECK: No JVD; No carotid bruits CARDIAC: RRR, no murmurs, rubs, gallops RESPIRATORY:  Clear to auscultation without rales, wheezing or rhonchi  ABDOMEN: Soft, non-tender, non-distended MUSCULOSKELETAL:  No edema; No deformity  SKIN: Warm and dry NEUROLOGIC:  Alert and oriented x 3 PSYCHIATRIC:  Normal affect   ASSESSMENT:    1. Mixed hyperlipidemia    PLAN:    In order of problems listed above:  Mixed hyperlipidemia, not tolerant to statins, Zetia.  Start bempedoic acid, start Repatha.  Follow-up in 6 to 8  weeks.  Plan to repeat lipid panel if tolerating medications in about 3 months.  Follow-up in 6 to 8 weeks        Medication Adjustments/Labs and Tests Ordered: Current medicines are reviewed at length with the patient today.  Concerns regarding medicines are outlined above.  Orders Placed This Encounter  Procedures   EKG 12-Lead   Meds ordered this encounter  Medications   Bempedoic Acid 180 MG TABS    Sig: Take 180 mg by mouth daily.    Dispense:  90 tablet    Refill:  1   Evolocumab (REPATHA SURECLICK) 825 MG/ML SOAJ    Sig: Inject 140 mg into the skin every 14 (fourteen) days.    Dispense:  2 mL    Refill:  3    Patient Instructions  Medication Instructions:   START Repatha - inject one pen into skin every 14 days.  START Bempedoic Acid 180 MG TABS - take one tablet ('180mg'$ ) by mouth daily.   *If you need a refill on your cardiac medications before your next appointment, please call your pharmacy*   Lab Work:  None Ordered  If you have labs (blood work) drawn today and your tests are completely normal, you will receive your results only by: [place] (if you have MyChart) OR A paper copy in the mail If you have any lab test that is abnormal or we need to change your treatment, we will call you to review the results.   Testing/Procedures:  None Ordered   Follow-Up: At [facility], you and your health needs are our priority.  As part of our continuing mission to provide you with exceptional heart care, we have created designated Provider  Care Teams.  These Care Teams include your primary Cardiologist (physician) and Advanced Practice Providers (APPs -  Physician Assistants and Nurse Practitioners) who all work together to provide you with the care you need, when you need it.  We recommend signing up for the patient portal called "MyChart".  Sign up information is provided on this After Visit Summary.  MyChart is used to connect with patients for  Virtual Visits (Telemedicine).  Patients are able to view lab/test results, encounter notes, upcoming appointments, etc.  Non-urgent messages can be sent to your provider as well.   To learn more about what you can do with MyChart, go to [email or web address].    Your next appointment:   6 - 8  week(s)  The format for your next appointment:   In Person  Provider:   Kate Sable, MD ONLY    Repatha (Evolocumab) Injection What is this medication? EVOLOCUMAB (e voe LOK ue mab) treats high cholesterol. It may also be used to lower the risk of heart attack, stroke, and a type of heart surgery. It works by decreasing bad cholesterol (such as LDL) in your blood. It is a monoclonal antibody. Changes to diet and exercise are often combined with this medication. This medicine may be used for other purposes; ask your health care provider or pharmacist if you have questions. COMMON BRAND NAME(S): Repatha, Repatha SureClick What should I tell my care team before I take this medication? They need to know if you have any of these conditions: An unusual or allergic reaction to evolocumab, latex, other medications, foods, dyes, or preservatives Pregnant or trying to get pregnant Breast-feeding How should I use this medication? This medication is injected under the skin. You will be taught how to prepare and give it. Take it as directed on the prescription label at the same time every day. Keep taking it unless your care team tells you to stop. It is important that you put your used needles and syringes in a special sharps container. Do not put them in a trash can. If you do not have a sharps container, call your pharmacist or care team to get one. This medication comes with INSTRUCTIONS FOR USE. Ask your pharmacist for directions on how to use this medication. Read the information carefully. Talk to your pharmacist or care team if you have questions. Talk to your care team about the use of this  medication in children. While it may be prescribed for children as young as 10 years for selected conditions, precautions do apply. Overdosage: If you think you have taken too much of this medicine contact a poison control center or emergency room at once. NOTE: This medicine is only for you. Do not share this medicine with others. What if I miss a dose? It is important not to miss any doses. Talk to your care team about what to do if you miss a dose. What may interact with this medication? Interactions are not expected. This list may not describe all possible interactions. Give your health care provider a list of all the medicines, herbs, non-prescription drugs, or dietary supplements you use. Also tell them if you smoke, drink alcohol, or use illegal drugs. Some items may interact with your medicine. What should I watch for while using this medication? Visit your care team for regular checks on your progress. Tell your care team if your symptoms do not start to get better or if they get worse. You may need blood work while you are taking  this medication. Do not wear the on-body infuser during an MRI. Taking this medication is only part of a total heart healthy program. Ask your care team if there are other changes you can make to improve your overall health. What side effects may I notice from receiving this medication? Side effects that you should report to your care team as soon as possible: Allergic reactions or angioedema--skin rash, itching or hives, swelling of the face, eyes, lips, tongue, arms, or legs, trouble swallowing or breathing Side effects that usually do not require medical attention (report to your care team if they continue or are bothersome): Back pain Flu-like symptoms--fever, chills, muscle pain, cough, headache, fatigue Pain, redness, or irritation at injection site Runny or stuffy nose Sore throat This list may not describe all possible side effects. Call your doctor  for medical advice about side effects. You may report side effects to FDA at 1-800-FDA-1088. Where should I keep my medication? Keep out of the reach of children and pets. Store in a refrigerator or at room temperature between 20 and 25 degrees C (68 and 77 degrees F). Refrigeration (preferred): Store it in the refrigerator. Do not freeze. Keep it in the original carton until you are ready to take it. Remove the dose from the carton about 30 minutes before it is time for you to take it. Get rid of any unused medication after the expiration date. Room temperature: This medication may be stored at room temperature for up to 30 days. Keep it in the original carton until you are ready to take it. If it is stored at room temperature, get rid of any unused medication after 30 days or after it expires, whichever is first. Protect from light. Do not shake. Avoid exposure to extreme heat. To get rid of medications that are no longer needed or have expired: Take the medication to a medication take-back program. Check with your pharmacy or law enforcement to find a location. If you cannot return the medication, ask your pharmacist or care team how to get rid of this medication safely. NOTE: This sheet is a summary. It may not cover all possible information. If you have questions about this medicine, talk to your doctor, pharmacist, or health care provider.  2023 Elsevier/Gold Standard (2021-11-07 00:00:00)  2. Bempedoic Acid Tablets What is this medication? BEMPEDOIC ACID (BEM pe DOE ik AS id) treats high cholesterol. It works by reducing the amount of cholesterol made by your body. Changes to diet and exercise are often combined with this medication. This medicine may be used for other purposes; ask your health care provider or pharmacist if you have questions. COMMON BRAND NAME(S): NEXLETOL What should I tell my care team before I take this medication? They need to know if you have any of these  conditions: Gout Kidney problems Liver disease Tendon problems An unusual or allergic reaction to bempedoic acid, other medications, foods, dyes, or preservatives Pregnant or trying to become pregnant Breast-feeding How should I use this medication? Take this medication by mouth. Take it as directed on the prescription label at the same time every day. You can take it with or without food. If it upsets your stomach, take it with food. Keep taking it unless your care team tells you to stop. Talk to your care team about the use of this medication in children. Special care may be needed. Overdosage: If you think you have taken too much of this medicine contact a poison control center or emergency room  at once. NOTE: This medicine is only for you. Do not share this medicine with others. What if I miss a dose? If you miss a dose, take it as soon as you can. If it is almost time for your next dose, take only that dose. Do not take double or extra doses. What may interact with this medication? Pravastatin Simvastatin This list may not describe all possible interactions. Give your health care provider a list of all the medicines, herbs, non-prescription drugs, or dietary supplements you use. Also tell them if you smoke, drink alcohol, or use illegal drugs. Some items may interact with your medicine. What should I watch for while using this medication? Visit your care team for regular checks on your progress. Tell your care team if your symptoms do not start to get better or if they get worse. Your care team may tell you to stop taking this medication if you develop muscle problems. If your muscle problems do not go away after stopping this medication, contact your care team. Taking this medication is only part of a total heart healthy program. Ask your care team if there are other changes you can make to improve your overall health. What side effects may I notice from receiving this medication? Side  effects that you should report to your care team as soon as possible: Allergic reactions--skin rash, itching, hives, swelling of the face, lips, tongue, or throat High uric acid level--severe pain, redness, warmth, or swelling in joints, pain or trouble passing urine, pain in the lower back or sides Joint, muscle, or tendon pain, swelling, or stiffness Side effects that usually do not require medical attention (report to your care team if they continue or are bothersome): Diarrhea Muscle spasms Stomach pain This list may not describe all possible side effects. Call your doctor for medical advice about side effects. You may report side effects to FDA at 1-800-FDA-1088. Where should I keep my medication? Keep out of the reach of children and pets. Store between 15 and 30 degrees C (59 and 86 degrees F). Keep this medication in the original container. Protect from moisture. Keep the container tightly closed. Do not throw out the packet in the container. It keeps the medication dry. Get rid of any unused medication after the expiration date. To get rid of medications that are no longer needed or have expired: Take the medication to a medication take-back program. Check with your pharmacy or law enforcement to find a location. If you cannot return the medication, check the label or package insert to see if the medication should be thrown out in the trash or flushed down the toilet. If you are not sure, ask your care team. If it is safe to put it in the trash, empty the medication out of the container. Mix the medication with cat litter, dirt, coffee grounds, or other unwanted substance. Seal the mixture in a bag or container. Put it in the trash. NOTE: This sheet is a summary. It may not cover all possible information. If you have questions about this medicine, talk to your doctor, pharmacist, or health care provider.  2023 Elsevier/Gold Standard (2021-11-06 00:00:00)     Signed, Kate Sable,  MD  11/01/2022 11:43 AM    [place]

## 2022-11-01 NOTE — Patient Instructions (Signed)
Medication Instructions:   START Repatha - inject one pen into skin every 14 days.  START Bempedoic Acid 180 MG TABS - take one tablet ('180mg'$ ) by mouth daily.   *If you need a refill on your cardiac medications before your next appointment, please call your pharmacy*   Lab Work:  None Ordered  If you have labs (blood work) drawn today and your tests are completely normal, you will receive your results only by: [place] (if you have MyChart) OR A paper copy in the mail If you have any lab test that is abnormal or we need to change your treatment, we will call you to review the results.   Testing/Procedures:  None Ordered   Follow-Up: At [facility], you and your health needs are our priority.  As part of our continuing mission to provide you with exceptional heart care, we have created designated Provider Care Teams.  These Care Teams include your primary Cardiologist (physician) and Advanced Practice Providers (APPs -  Physician Assistants and Nurse Practitioners) who all work together to provide you with the care you need, when you need it.  We recommend signing up for the patient portal called "MyChart".  Sign up information is provided on this After Visit Summary.  MyChart is used to connect with patients for Virtual Visits (Telemedicine).  Patients are able to view lab/test results, encounter notes, upcoming appointments, etc.  Non-urgent messages can be sent to your provider as well.   To learn more about what you can do with MyChart, go to [email or web address].    Your next appointment:   6 - 8  week(s)  The format for your next appointment:   In Person  Provider:   Kate Sable, MD ONLY    Repatha (Evolocumab) Injection What is this medication? EVOLOCUMAB (e voe LOK ue mab) treats high cholesterol. It may also be used to lower the risk of heart attack, stroke, and a type of heart surgery. It works by decreasing bad cholesterol (such as LDL) in  your blood. It is a monoclonal antibody. Changes to diet and exercise are often combined with this medication. This medicine may be used for other purposes; ask your health care provider or pharmacist if you have questions. COMMON BRAND NAME(S): Repatha, Repatha SureClick What should I tell my care team before I take this medication? They need to know if you have any of these conditions: An unusual or allergic reaction to evolocumab, latex, other medications, foods, dyes, or preservatives Pregnant or trying to get pregnant Breast-feeding How should I use this medication? This medication is injected under the skin. You will be taught how to prepare and give it. Take it as directed on the prescription label at the same time every day. Keep taking it unless your care team tells you to stop. It is important that you put your used needles and syringes in a special sharps container. Do not put them in a trash can. If you do not have a sharps container, call your pharmacist or care team to get one. This medication comes with INSTRUCTIONS FOR USE. Ask your pharmacist for directions on how to use this medication. Read the information carefully. Talk to your pharmacist or care team if you have questions. Talk to your care team about the use of this medication in children. While it may be prescribed for children as young as 10 years for selected conditions, precautions do apply. Overdosage: If you think you have taken too much of this medicine  contact a poison control center or emergency room at once. NOTE: This medicine is only for you. Do not share this medicine with others. What if I miss a dose? It is important not to miss any doses. Talk to your care team about what to do if you miss a dose. What may interact with this medication? Interactions are not expected. This list may not describe all possible interactions. Give your health care provider a list of all the medicines, herbs, non-prescription drugs,  or dietary supplements you use. Also tell them if you smoke, drink alcohol, or use illegal drugs. Some items may interact with your medicine. What should I watch for while using this medication? Visit your care team for regular checks on your progress. Tell your care team if your symptoms do not start to get better or if they get worse. You may need blood work while you are taking this medication. Do not wear the on-body infuser during an MRI. Taking this medication is only part of a total heart healthy program. Ask your care team if there are other changes you can make to improve your overall health. What side effects may I notice from receiving this medication? Side effects that you should report to your care team as soon as possible: Allergic reactions or angioedema--skin rash, itching or hives, swelling of the face, eyes, lips, tongue, arms, or legs, trouble swallowing or breathing Side effects that usually do not require medical attention (report to your care team if they continue or are bothersome): Back pain Flu-like symptoms--fever, chills, muscle pain, cough, headache, fatigue Pain, redness, or irritation at injection site Runny or stuffy nose Sore throat This list may not describe all possible side effects. Call your doctor for medical advice about side effects. You may report side effects to FDA at 1-800-FDA-1088. Where should I keep my medication? Keep out of the reach of children and pets. Store in a refrigerator or at room temperature between 20 and 25 degrees C (68 and 77 degrees F). Refrigeration (preferred): Store it in the refrigerator. Do not freeze. Keep it in the original carton until you are ready to take it. Remove the dose from the carton about 30 minutes before it is time for you to take it. Get rid of any unused medication after the expiration date. Room temperature: This medication may be stored at room temperature for up to 30 days. Keep it in the original carton until  you are ready to take it. If it is stored at room temperature, get rid of any unused medication after 30 days or after it expires, whichever is first. Protect from light. Do not shake. Avoid exposure to extreme heat. To get rid of medications that are no longer needed or have expired: Take the medication to a medication take-back program. Check with your pharmacy or law enforcement to find a location. If you cannot return the medication, ask your pharmacist or care team how to get rid of this medication safely. NOTE: This sheet is a summary. It may not cover all possible information. If you have questions about this medicine, talk to your doctor, pharmacist, or health care provider.  2023 Elsevier/Gold Standard (2021-11-07 00:00:00)  2. Bempedoic Acid Tablets What is this medication? BEMPEDOIC ACID (BEM pe DOE ik AS id) treats high cholesterol. It works by reducing the amount of cholesterol made by your body. Changes to diet and exercise are often combined with this medication. This medicine may be used for other purposes; ask your health  care provider or pharmacist if you have questions. COMMON BRAND NAME(S): NEXLETOL What should I tell my care team before I take this medication? They need to know if you have any of these conditions: Gout Kidney problems Liver disease Tendon problems An unusual or allergic reaction to bempedoic acid, other medications, foods, dyes, or preservatives Pregnant or trying to become pregnant Breast-feeding How should I use this medication? Take this medication by mouth. Take it as directed on the prescription label at the same time every day. You can take it with or without food. If it upsets your stomach, take it with food. Keep taking it unless your care team tells you to stop. Talk to your care team about the use of this medication in children. Special care may be needed. Overdosage: If you think you have taken too much of this medicine contact a poison  control center or emergency room at once. NOTE: This medicine is only for you. Do not share this medicine with others. What if I miss a dose? If you miss a dose, take it as soon as you can. If it is almost time for your next dose, take only that dose. Do not take double or extra doses. What may interact with this medication? Pravastatin Simvastatin This list may not describe all possible interactions. Give your health care provider a list of all the medicines, herbs, non-prescription drugs, or dietary supplements you use. Also tell them if you smoke, drink alcohol, or use illegal drugs. Some items may interact with your medicine. What should I watch for while using this medication? Visit your care team for regular checks on your progress. Tell your care team if your symptoms do not start to get better or if they get worse. Your care team may tell you to stop taking this medication if you develop muscle problems. If your muscle problems do not go away after stopping this medication, contact your care team. Taking this medication is only part of a total heart healthy program. Ask your care team if there are other changes you can make to improve your overall health. What side effects may I notice from receiving this medication? Side effects that you should report to your care team as soon as possible: Allergic reactions--skin rash, itching, hives, swelling of the face, lips, tongue, or throat High uric acid level--severe pain, redness, warmth, or swelling in joints, pain or trouble passing urine, pain in the lower back or sides Joint, muscle, or tendon pain, swelling, or stiffness Side effects that usually do not require medical attention (report to your care team if they continue or are bothersome): Diarrhea Muscle spasms Stomach pain This list may not describe all possible side effects. Call your doctor for medical advice about side effects. You may report side effects to FDA at  1-800-FDA-1088. Where should I keep my medication? Keep out of the reach of children and pets. Store between 15 and 30 degrees C (59 and 86 degrees F). Keep this medication in the original container. Protect from moisture. Keep the container tightly closed. Do not throw out the packet in the container. It keeps the medication dry. Get rid of any unused medication after the expiration date. To get rid of medications that are no longer needed or have expired: Take the medication to a medication take-back program. Check with your pharmacy or law enforcement to find a location. If you cannot return the medication, check the label or package insert to see if the medication should be thrown  out in the trash or flushed down the toilet. If you are not sure, ask your care team. If it is safe to put it in the trash, empty the medication out of the container. Mix the medication with cat litter, dirt, coffee grounds, or other unwanted substance. Seal the mixture in a bag or container. Put it in the trash. NOTE: This sheet is a summary. It may not cover all possible information. If you have questions about this medicine, talk to your doctor, pharmacist, or health care provider.  2023 Elsevier/Gold Standard (2021-11-06 00:00:00)

## 2022-11-05 ENCOUNTER — Telehealth: Payer: Self-pay

## 2022-11-05 NOTE — Telephone Encounter (Signed)
Incoming fax from [employer].   PA for Repatha.   PA was completed, signed and faxed to the number provided on the PA.   Awaiting determination.

## 2022-11-15 ENCOUNTER — Telehealth: Payer: Self-pay | Admitting: Cardiology

## 2022-11-15 DIAGNOSIS — E782 Mixed hyperlipidemia: Secondary | ICD-10-CM

## 2022-11-15 NOTE — Telephone Encounter (Signed)
[facility] with Dr. Thereasa Solo information. They told him they don't have him done as a cardiologist.

## 2022-11-15 NOTE — Telephone Encounter (Signed)
Pt c/o medication issue:  1. Name of Medication:   Bempedoic Acid 180 MG TABS    Evolocumab (REPATHA SURECLICK) 969 MG/ML SOAJ    2. How are you currently taking this medication (dosage and times per day)?   3. Are you having a reaction (difficulty breathing--STAT)? No  4. What is your medication issue? Pt states that pharmacy is needing approval for medication before he is able to get them. He says the number below is the number that can be called to take care of the issue. Pt would also like a callback once this is taken care of. Please advise   [phone]

## 2022-11-15 NOTE — Telephone Encounter (Signed)
[place], [place] Medication Management  Reason for call   All Conversations: Medication Management (Newest Message First) November 05, 2022 [place], [place]      11/05/22  2:35 PM Note Incoming [facility].    PA for Repatha.    PA was completed, signed and faxed to the number provided on the PA.    Awaiting determination.

## 2022-11-15 NOTE — Telephone Encounter (Signed)
Spoke with Larry Crawford with [facility] regarding a prior authorization for Case # [id number] for Repatha 140 mg inject into the skin every 14 (fourteen) days, has been denied.  Case# [id number]: Bempedoic Acid 180 mg is approved with the start date of 10/16/2022 for life as long as the patient has the same health insurance.   The patient has been contacted of the Repatha denial and the approval of the Bempedoic acid 180 mg.   Please review the above and place an appeal in for the Repatha 140 mg.  The patient is waiting to here from the appeal on Repatha.

## 2022-11-20 ENCOUNTER — Ambulatory Visit: Payer: Self-pay

## 2022-11-20 MED ORDER — BEMPEDOIC ACID 180 MG PO TABS: 180.0000 mg | ORAL_TABLET | Freq: Every day | ORAL | 1 refills | Status: DC

## 2022-11-20 NOTE — Telephone Encounter (Signed)
  Chief Complaint: anxiety Symptoms: anxiety and depression, not wanting to do anything and no energy, having difficulty sleeping and waking up with nightmares Frequency: 1 month or more  Pertinent Negatives: Patient denies SI or HI Disposition: '[]'$ ED /'[]'$ Urgent Care (no appt availability in office) / '[x]'$ Appointment(In office/virtual)/ '[]'$  [place] [facility]/ '[]'$ Home Care/ '[]'$ Refused Recommended Disposition /'[]'$ [place] [facility]/ '[]'$  Follow-up with PCP Additional Notes: pt requesting Prozac for sx. Pt has had this issue before especially with the weather. Scheduled appt for 11/21/22 at 1500 with Ria Comment, [place] d/t Dr. Caryn Section not having any open appts.   Reason for Disposition  MODERATE anxiety (e.g., persistent or frequent anxiety symptoms; interferes with sleep, school, or work)  Answer Assessment - Initial Assessment Questions 1. CONCERN: "Did anything happen that prompted you to call today?"      Anxiety and depression 2. ANXIETY SYMPTOMS: "Can you describe how you (your loved one; patient) have been feeling?" (e.g., tense, restless, panicky, anxious, keyed up, overwhelmed, sense of impending doom).      Dont want to do anything, no energy, trouble sleeping  3. ONSET: "How long have you been feeling this way?" (e.g., hours, days, weeks)     1 month at least  4. SEVERITY: "How would you rate the level of anxiety?" (e.g., 0 - 10; or mild, moderate, severe).     moderate 5. FUNCTIONAL IMPAIRMENT: "How have these feelings affected your ability to do daily activities?" "Have you had more difficulty than usual doing your normal daily activities?" (e.g., getting better, same, worse; self-care, school, work, interactions)     Affecting daily care 6. HISTORY: "Have you felt this way before?" "Have you ever been diagnosed with an anxiety problem in the past?" (e.g., generalized anxiety disorder, panic attacks, PTSD). If Yes, ask: "How was this problem treated?" (e.g., medicines, counseling, etc.)      yes 7. RISK OF HARM - SUICIDAL IDEATION: "Do you ever have thoughts of hurting or killing yourself?" If Yes, ask:  "Do you have these feelings now?" "Do you have a plan on how you would do this?"     no 8. TREATMENT:  "What has been done so far to treat this anxiety?" (e.g., medicines, relaxation strategies). "What has helped?"     Medications in the past  11. PATIENT SUPPORT: "Who is with you now?" "Who do you live with?" "Do you have family or friends who you can talk to?"        no 12. OTHER SYMPTOMS: "Do you have any other symptoms?" (e.g., feeling depressed, trouble concentrating, trouble sleeping, trouble breathing, palpitations or fast heartbeat, chest pain, sweating, nausea, or diarrhea)       Trouble sleeping  Protocols used: Anxiety and Panic Attack-A-AH

## 2022-11-20 NOTE — Telephone Encounter (Signed)
Spoke with patient and reviewed that [facility] has [facility] Garen Lah [facility] for cardioloy and they are processing his prescriptions. He was appreciative for the follow up with no further questions at this time.

## 2022-11-21 ENCOUNTER — Encounter: Payer: Self-pay | Admitting: Physician Assistant

## 2022-11-21 ENCOUNTER — Ambulatory Visit (INDEPENDENT_AMBULATORY_CARE_PROVIDER_SITE_OTHER): Payer: Medicare Other | Admitting: Physician Assistant

## 2022-11-21 VITALS — BP 152/79 | HR 74 | Wt 203.0 lb

## 2022-11-21 DIAGNOSIS — F32A Depression, unspecified: Secondary | ICD-10-CM | POA: Diagnosis not present

## 2022-11-21 DIAGNOSIS — F419 Anxiety disorder, unspecified: Secondary | ICD-10-CM | POA: Diagnosis not present

## 2022-11-21 MED ORDER — SERTRALINE HCL 25 MG PO TABS: 25.0000 mg | ORAL_TABLET | Freq: Every day | ORAL | 1 refills | Status: DC

## 2022-11-21 NOTE — Assessment & Plan Note (Signed)
Pt has a history of trying prozac, zoloft, Chlordiazepoxide Reports seeing a therapist last year with minimal improvement Advised starting zoloft 25 mg consider therapy, follow up with pcp

## 2022-11-21 NOTE — Progress Notes (Signed)
I,Sha'taria Tyson,acting as a Education administrator for [employer], PA-C.,have documented all relevant documentation on the behalf of Mikey Kirschner, PA-C,as directed by  Mikey Kirschner, PA-C while in the presence of Mikey Kirschner, PA-C.   Established patient visit   Patient: Larry Crawford   DOB: 11/06/1941   82 y.o. Male  MRN: [id number] Visit Date: 11/21/2022  Today's healthcare provider: Mikey Kirschner, PA-C   Cc. Anxiety and depression  Subjective    Anxiety Presents for initial visit. Onset was 1 to 6 months ago (1 month ago). The problem has been gradually worsening. Symptoms include dizziness, dry mouth, excessive worry, irritability, nausea, nervous/anxious behavior, restlessness and shortness of breath. Primary symptoms comment: difficulty sleeping, nightmares and no energy to do anything. Symptoms occur most days.    Describe some panic attacks, nightmares? Where he wakes up from sleep short of breath with his heart racing. Reports these are infrequent. Reports dealing with these feelings before, that it seems seasonal, when the weather changes.  Denies SI    11/21/2022    2:44 PM  GAD 7 : Generalized Anxiety Score  Nervous, Anxious, on Edge 2  Control/stop worrying 2  Worry too much - different things 3  Trouble relaxing 2  Restless 2  Easily annoyed or irritable 2  Afraid - awful might happen 3  Total GAD 7 Score 16  Anxiety Difficulty Somewhat difficult        11/21/2022    2:44 PM 10/22/2022    9:04 AM 09/21/2022    9:32 AM  PHQ9 SCORE ONLY  PHQ-9 Total Score '16 4 13    '$ Medications: Outpatient Medications Prior to Visit  Medication Sig   alendronate (FOSAMAX) 70 MG tablet Take 1 tablet (70 mg total) by mouth once a week. Take with a full glass of water on an empty stomach.   allopurinol (ZYLOPRIM) 300 MG tablet Take 1 tablet (300 mg total) by mouth in the morning. (Patient taking differently: Take 150 mg by mouth in the morning.)   Bempedoic Acid 180 MG TABS  Take 180 mg by mouth daily.   cetirizine (ZYRTEC) 10 MG tablet Take 10 mg by mouth in the morning.   Coenzyme Q10 (COQ10) 100 MG CAPS Take 100 mg by mouth in the morning.   Cyanocobalamin (VITAMIN B-12) 5000 MCG SUBL Place 5,000 mcg under the tongue in the morning.   DERMOTIC 0.01 % OIL Apply 1 application  topically See admin instructions. 1 application in the ears once or twice weekly   Evolocumab (REPATHA SURECLICK) 450 MG/ML SOAJ Inject 140 mg into the skin every 14 (fourteen) days.   fluticasone (FLONASE) 50 MCG/ACT nasal spray Place 1 spray into both nostrils daily as needed for allergies or rhinitis.   HYDROcodone-acetaminophen (NORCO) 5-325 MG tablet Take 1 tablet by mouth every 6 (six) hours as needed for moderate pain.   L-Arginine 1000 MG TABS Take 1,000 mg by mouth 3 (three) times a week.   Multiple Vitamin (MULTIVITAMIN WITH MINERALS) TABS tablet Take 1 tablet by mouth in the morning.   Multiple Vitamins-Minerals (PRESERVISION AREDS 2 PO) Take 1 tablet by mouth in the morning.   niacin 500 MG tablet Take 500 mg by mouth in the morning.   tadalafil (CIALIS) 5 MG tablet Take 1 tablet (5 mg total) by mouth every morning.   cimetidine (TAGAMET) 200 MG tablet Take 200 mg by mouth daily as needed (heartburn). (Patient not taking: Reported on 10/22/2022)   famotidine (PEPCID) 20 MG  tablet Take 20 mg by mouth daily as needed for heartburn or indigestion. (Patient not taking: Reported on 10/22/2022)   No facility-administered medications prior to visit.    Review of Systems  Constitutional:  Positive for irritability.  Respiratory:  Positive for shortness of breath.   Gastrointestinal:  Positive for nausea.  Neurological:  Positive for dizziness.  Psychiatric/Behavioral:  The patient is nervous/anxious.      Objective    Blood pressure (!) 152/79, pulse 74, weight 203 lb (92.1 kg), SpO2 98 %.   Physical Exam Vitals reviewed.  Constitutional:      Appearance: He is not  ill-appearing.  HENT:     Head: Normocephalic.  Eyes:     Conjunctiva/sclera: Conjunctivae normal.  Cardiovascular:     Rate and Rhythm: Normal rate.  Pulmonary:     Effort: Pulmonary effort is normal. No respiratory distress.  Neurological:     General: No focal deficit present.     Mental Status: He is alert and oriented to person, place, and time.  Psychiatric:        Mood and Affect: Mood normal.        Behavior: Behavior normal.     No results found for any visits on 11/21/22.  Assessment & Plan     Problem List Items Addressed This Visit       Other   Anxiety and depression - Primary    Pt has a history of trying prozac, zoloft, Chlordiazepoxide Reports seeing a therapist last year with minimal improvement Advised starting zoloft 25 mg consider therapy, follow up with pcp      Relevant Medications   sertraline (ZOLOFT) 25 MG tablet     Return in about 6 weeks (around 01/02/2023) for depression, anxiety.      I, Mikey Kirschner, PA-C have reviewed all documentation for this visit. The documentation on  11/21/2022 for the exam, diagnosis, procedures, and orders are all accurate and complete.  Mikey Kirschner, PA-C [facility] [address] [place], [place], [address] Office: [phone] Fax: [place]

## 2022-11-22 DIAGNOSIS — Z961 Presence of intraocular lens: Secondary | ICD-10-CM | POA: Diagnosis not present

## 2022-11-22 DIAGNOSIS — H353131 Nonexudative age-related macular degeneration, bilateral, early dry stage: Secondary | ICD-10-CM | POA: Diagnosis not present

## 2022-12-14 ENCOUNTER — Ambulatory Visit: Payer: Medicare Other | Attending: Cardiology | Admitting: Cardiology

## 2022-12-14 ENCOUNTER — Encounter: Payer: Self-pay | Admitting: Cardiology

## 2022-12-14 VITALS — BP 124/80 | HR 90 | Ht 69.0 in | Wt 201.6 lb

## 2022-12-14 DIAGNOSIS — E782 Mixed hyperlipidemia: Secondary | ICD-10-CM | POA: Diagnosis not present

## 2022-12-14 MED ORDER — ICOSAPENT ETHYL 1 G PO CAPS: 2.0000 g | ORAL_CAPSULE | Freq: Two times a day (BID) | ORAL | 3 refills | Status: DC

## 2022-12-14 NOTE — Patient Instructions (Signed)
Medication Instructions:   START Vascepa - take two tablets (2 grams) by mouth twice a day.   *If you need a refill on your cardiac medications before your next appointment, please call your pharmacy*   Lab Work:  none ordered  If you have labs (blood work) drawn today and your tests are completely normal, you will receive your results only by: [place] (if you have MyChart) OR A paper copy in the mail If you have any lab test that is abnormal or we need to change your treatment, we will call you to review the results.   Testing/Procedures:  None Ordered   Follow-Up: At [facility], you and your health needs are our priority.  As part of our continuing mission to provide you with exceptional heart care, we have created designated Provider Care Teams.  These Care Teams include your primary Cardiologist (physician) and Advanced Practice Providers (APPs -  Physician Assistants and Nurse Practitioners) who all work together to provide you with the care you need, when you need it.  We recommend signing up for the patient portal called "MyChart".  Sign up information is provided on this After Visit Summary.  MyChart is used to connect with patients for Virtual Visits (Telemedicine).  Patients are able to view lab/test results, encounter notes, upcoming appointments, etc.  Non-urgent messages can be sent to your provider as well.   To learn more about what you can do with MyChart, go to [email or web address].    Your next appointment:   4 month(s)  Provider:   You may see Kate Sable, MD or one of the following Advanced Practice Providers on your designated Care Team:   Murray Hodgkins, NP Christell Faith, PA-C Cadence Kathlen Mody, PA-C Gerrie Nordmann, NP  Other Instructions  You have been referred to [facility] to help control your cholesterol.

## 2022-12-14 NOTE — Progress Notes (Signed)
Cardiology Office Note:    Date:  12/14/2022   ID:  Larry Crawford, DOB [birth year], MRN [id number]  PCP:  Birdie Sons, MD   [place] Prov[facility] Cardiologist:  Kate Sable, MD     Referring MD: Birdie Sons, MD   Chief Complaint  Patient presents with   Follow-up    HTN f/u, medication intolerance     History of Present Illness:    Larry Crawford is a 82 y.o. male with a hx of hyperlipidemia who presents for follow-up.  Previously seen elevated cholesterol.    Has not tolerated statins, Zetia in the past.  Started on bempedoic acid previously which she was tolerating initially, but later on developed a headache, stopped bempedoic acid about a week ago.  Repatha was prescribed but his insurance did not approve medication.  Denies any new concerns at this time.  Past Medical History:  Diagnosis Date   BPH (benign prostatic hypertrophy)    Colon polyp    Elevated red blood cell count    Erectile dysfunction    Gout    History of chicken pox    History of measles    History of mumps    IBS (irritable bowel syndrome)    MRSA infection 2000   nasal   Skin cancer (melanoma) ([place]) 2021   left arm    Past Surgical History:  Procedure Laterality Date   aortic ultrasound  2013   normal. Screening ultrasound at [facility] per pt report   BCC Excised from chest  1984   BUNIONECTOMY Right    great toe   CATARACT EXTRACTION, [place]  [phone]   2 on right/1 on left   IR KYPHO LUMBAR INC FX REDUCE BONE BX UNI/BIL CANNULATION INC/IMAGING  02/16/2022   IR RADIOLOGIST EVAL & MGMT  01/30/2022   IR RADIOLOGIST EVAL & MGMT  02/27/2022   KYPHOPLASTY N/A 04/03/2021   Procedure: L1 KYPHOPLASTY AND BONE BIOPDY;  Surgeon: Hessie Knows, MD;  Location: [facility] ORS;  Service: Orthopedics;  Laterality: N/A;   LIPOMA EXCISION  2013   NOSE SURGERY     divided septum   PARTIAL KNEE ARTHROPLASTY Right  01/10/2021   Procedure: UNICOMPARTMENTAL KNEE;  Surgeon: Corky Mull, MD;  Location: [facility] ORS;  Service: Orthopedics;  Laterality: Right;   PENILE PROSTHESIS IMPLANT  12/17/2013   POLYPECTOMY     PROSTATE SURGERY     Rezum procedure done in office   [place]   left X2 for spurs   TONSILLECTOMY AND ADENOIDECTOMY  2336   UMBILICAL HERNIA REPAIR      Current Medications: Current Meds  Medication Sig   alendronate (FOSAMAX) 70 MG tablet Take 1 tablet (70 mg total) by mouth once a week. Take with a full glass of water on an empty stomach.   allopurinol (ZYLOPRIM) 300 MG tablet Take 1 tablet (300 mg total) by mouth in the morning. (Patient taking differently: Take 150 mg by mouth in the morning.)   cetirizine (ZYRTEC) 10 MG tablet Take 10 mg by mouth in the morning.   Coenzyme Q10 (COQ10) 100 MG CAPS Take 100 mg by mouth in the morning.   Cyanocobalamin (VITAMIN B-12) 5000 MCG SUBL Place 5,000 mcg under the tongue in the morning.   DERMOTIC 0.01 % OIL Apply 1 application  topically See admin instructions. 1 application in the ears once or twice weekly   fluticasone (FLONASE) 50 MCG/ACT nasal spray Place 1 spray into both nostrils daily as needed for allergies or rhinitis.   icosapent Ethyl (VASCEPA) 1 g capsule Take 2 capsules (2 g total) by mouth 2 (two) times daily.   L-Arginine 1000 MG TABS Take 1,000 mg by mouth 3 (three) times a week.   Multiple Vitamin (MULTIVITAMIN WITH MINERALS) TABS tablet Take 1 tablet by mouth in the morning.   Multiple Vitamins-Minerals (PRESERVISION AREDS 2 PO) Take 1 tablet by mouth in the morning.   niacin 500 MG tablet Take 500 mg by mouth in the morning.   sertraline (ZOLOFT) 25 MG tablet Take 1 tablet (25 mg total) by mouth daily.   tadalafil (CIALIS) 5 MG tablet Take 1 tablet (5 mg total) by mouth every morning.     Allergies:   Tetracyclines & related, Colestipol hcl, Corn-containing products, Pravastatin  sodium, Shellfish allergy, Sulfa antibiotics, Welchol [colesevelam], and Zetia [ezetimibe]   Social History   Socioeconomic History   Marital status: Married    Spouse name: Freda Munro   Number of children: 4   Years of education: Not on file   Highest education level: 12th grade  Occupational History   Occupation: retired  Tobacco Use   Smoking status: Former    Packs/day: 0.75    Years: 5.00    Total pack years: 3.75    Types: Cigarettes    Quit date: 11/19/1958    Years since quitting: 64.1   Smokeless tobacco: Never  Vaping Use   Vaping Use: Never used  Substance and Sexual Activity   Alcohol use: Yes    Alcohol/week: 0.0 standard drinks of alcohol    Comment: 2 glasses of wine a month   Drug use: No   Sexual activity: Not on file  Other Topics Concern   Not on file  Social History Narrative   Pt went to [place] through the [employer].    Social Determinants of Health   Financial Resource Strain: Low Risk  (10/22/2022)   Overall Financial Resource Strain (CARDIA)    Difficulty of Paying Living Expenses: Not hard at all  Food Insecurity: No Food Insecurity (10/22/2022)   Hunger Vital Sign    Worried About Running Out of Food in the Last Year: Never true    Ran Out of Food in the Last Year: Never true  Transportation Needs: No Transportation Needs (10/22/2022)   PRAPARE - Hydrologist (Medical): No    Lack of Transportation (Non-Medical): No  Physical Activity: Insufficiently Active (10/22/2022)   Exercise Vital Sign    Days of Exercise per Week: 2 days    Minutes of Exercise per Session: 20 min  Stress: No Stress Concern Present (10/22/2022)   [place]    Feeling of Stress : Only a little  Social Connections: Socially Isolated (10/22/2022)   Social Connection and Isolation Panel [NHANES]    Frequency of Communication with Friends and Family: Twice a week    Frequency of  Social Gatherings with Friends and Family: Never    Attends Religious Services: Never    Marine scientist or Organizations: No    Attends Music therapist: Never    Marital Status: Married     Family History: The patient's family history includes Cancer in his brother; Diabetes in his brother; Hypothyroidism in his  mother; Obesity in his brother.  ROS:   Please see the history of present illness.     All other systems reviewed and are negative.  EKGs/Labs/Other Studies Reviewed:    The following studies were reviewed today:   EKG:  EKG not ordered today.  Recent Labs: 02/16/2022: Platelets 377 03/30/2022: BUN 26; Creatinine 1.3; Hemoglobin 13.6; Potassium 4.2; Sodium 133 09/21/2022: TSH 1.570  Recent Lipid Panel    Component Value Date/Time   CHOL 304 (H) 09/21/2022 1050   TRIG 250 (H) 09/21/2022 1050   HDL 40 09/21/2022 1050   CHOLHDL 7.6 (H) 09/21/2022 1050   LDLCALC 214 (H) 09/21/2022 1050     Risk Assessment/Calculations:             Physical Exam:    VS:  BP 124/80 (BP Location: Left Arm, Patient Position: Sitting, Cuff Size: Normal)   Pulse 90   Ht '5\' 9"'$  (1.753 m)   Wt 201 lb 9.6 oz (91.4 kg)   SpO2 94%   BMI 29.77 kg/m     Wt Readings from Last 3 Encounters:  12/14/22 201 lb 9.6 oz (91.4 kg)  11/21/22 203 lb (92.1 kg)  11/01/22 202 lb 3.2 oz (91.7 kg)     GEN:  Well nourished, well developed in no acute distress HEENT: Normal NECK: No JVD; No carotid bruits CARDIAC: RRR, no murmurs, rubs, gallops RESPIRATORY:  Clear to auscultation without rales, wheezing or rhonchi  ABDOMEN: Soft, non-tender, non-distended MUSCULOSKELETAL:  No edema; No deformity  SKIN: Warm and dry NEUROLOGIC:  Alert and oriented x 3 PSYCHIATRIC:  Normal affect   ASSESSMENT:    1. Mixed hyperlipidemia    PLAN:    In order of problems listed above:  Mixed hyperlipidemia, LDL 214, likely has FH.  Not tolerant to statins, Zetia, bempedoic acid.  Repatha  not approved by insurance.  Start Vascepa, hopefully insurance approves.  Refer to lipid clinic.  Follow-up in 4 months     Medication Adjustments/Labs and Tests Ordered: Current medicines are reviewed at length with the patient today.  Concerns regarding medicines are outlined above.  Orders Placed This Encounter  Procedures   AMB Referral to [facility]   Meds ordered this encounter  Medications   icosapent Ethyl (VASCEPA) 1 g capsule    Sig: Take 2 capsules (2 g total) by mouth 2 (two) times daily.    Dispense:  120 capsule    Refill:  3    Patient Instructions  Medication Instructions:   START Vascepa - take two tablets (2 grams) by mouth twice a day.   *If you need a refill on your cardiac medications before your next appointment, please call your pharmacy*   Lab Work:  none ordered  If you have labs (blood work) drawn today and your tests are completely normal, you will receive your results only by: [place] (if you have MyChart) OR A paper copy in the mail If you have any lab test that is abnormal or we need to change your treatment, we will call you to review the results.   Testing/Procedures:  None Ordered   Follow-Up: At [facility], you and your health needs are our priority.  As part of our continuing mission to provide you with exceptional heart care, we have created designated Provider Care Teams.  These Care Teams include your primary Cardiologist (physician) and Advanced Practice Providers (APPs -  Physician Assistants and Nurse Practitioners) who all work together to provide  you with the care you need, when you need it.  We recommend signing up for the patient portal called "MyChart".  Sign up information is provided on this After Visit Summary.  MyChart is used to connect with patients for Virtual Visits (Telemedicine).  Patients are able to view lab/test results, encounter notes, upcoming appointments, etc.   Non-urgent messages can be sent to your provider as well.   To learn more about what you can do with MyChart, go to [email or web address].    Your next appointment:   4 month(s)  Provider:   You may see Kate Sable, MD or one of the following Advanced Practice Providers on your designated Care Team:   Murray Hodgkins, NP Christell Faith, PA-C Cadence Kathlen Mody, PA-C Gerrie Nordmann, NP  Other Instructions  You have been referred to [facility] to help control your cholesterol.     Signed, Kate Sable, MD  12/14/2022 2:33 PM    [place]

## 2022-12-17 ENCOUNTER — Telehealth: Payer: Self-pay

## 2022-12-17 NOTE — Telephone Encounter (Signed)
Prior Authorization initiated by [email or web address] for Vascepa 1 gm capsules.  KEY: [id number]  Response: [employer] is reviewing your PA request and will respond within 24 hours for Medicaid or up to 72 hours for non-Medicaid plans, based on the required timeframe determined by state or federal regulations.

## 2022-12-19 NOTE — Telephone Encounter (Signed)
Incoming fax from [employer].   Vascepa was denied.

## 2022-12-20 NOTE — Telephone Encounter (Signed)
Appt with lipid clinic scheduled for 03/18

## 2022-12-24 ENCOUNTER — Ambulatory Visit
Admission: EM | Admit: 2022-12-24 | Discharge: 2022-12-24 | Disposition: A | Discharge status: Home / Self Care | Payer: Medicare Other | Attending: Emergency Medicine | Admitting: Emergency Medicine

## 2022-12-24 DIAGNOSIS — K589 Irritable bowel syndrome without diarrhea: Secondary | ICD-10-CM | POA: Insufficient documentation

## 2022-12-24 DIAGNOSIS — N400 Enlarged prostate without lower urinary tract symptoms: Secondary | ICD-10-CM | POA: Diagnosis not present

## 2022-12-24 DIAGNOSIS — M109 Gout, unspecified: Secondary | ICD-10-CM | POA: Diagnosis not present

## 2022-12-24 DIAGNOSIS — Z87891 Personal history of nicotine dependence: Secondary | ICD-10-CM | POA: Insufficient documentation

## 2022-12-24 DIAGNOSIS — F419 Anxiety disorder, unspecified: Secondary | ICD-10-CM | POA: Diagnosis not present

## 2022-12-24 DIAGNOSIS — F32A Depression, unspecified: Secondary | ICD-10-CM | POA: Insufficient documentation

## 2022-12-24 DIAGNOSIS — J069 Acute upper respiratory infection, unspecified: Secondary | ICD-10-CM | POA: Diagnosis not present

## 2022-12-24 DIAGNOSIS — E782 Mixed hyperlipidemia: Secondary | ICD-10-CM | POA: Insufficient documentation

## 2022-12-24 DIAGNOSIS — Z85820 Personal history of malignant melanoma of skin: Secondary | ICD-10-CM | POA: Insufficient documentation

## 2022-12-24 DIAGNOSIS — U071 COVID-19: Secondary | ICD-10-CM | POA: Diagnosis not present

## 2022-12-24 LAB — POCT RAPID STREP A (OFFICE): Rapid Strep A Screen: NEGATIVE

## 2022-12-24 NOTE — ED Triage Notes (Signed)
Patient to Urgent Care with complaints of chest/ nasal congestion, sore throat, reports productive cough discolored mucus.  Fever yesterday, reports around 100. Taking ibuprofen/ tylenol.   Symptoms started on Saturday.

## 2022-12-24 NOTE — Discharge Instructions (Addendum)
Your strep test is negative.  Your COVID test is pending.    Take Tylenol as needed for fever or discomfort.  Rest and keep yourself hydrated.    Follow-up with your primary care provider if your symptoms are not improving.     

## 2022-12-24 NOTE — ED Provider Notes (Signed)
Larry Crawford    CSN: [id number] Arrival date & time: 12/24/22  1312      History   Chief Complaint Chief Complaint  Patient presents with   Nasal Congestion    HPI Larry Crawford is a 82 y.o. male.  Patient presents with 2-day history of low-grade fever, congestion, sore throat, productive cough.  Tmax 100.  He denies rash, chest pain, unusual shortness of breath, vomiting, diarrhea, or other symptoms.  No OTC medications taken today; took ibuprofen and Tylenol yesterday.  His medical history includes BPH, IBS, gout, osteoporosis, anxiety, depression, hyperlipidemia.  The history is provided by the patient and medical records.    Past Medical History:  Diagnosis Date   BPH (benign prostatic hypertrophy)    Colon polyp    Elevated red blood cell count    Erectile dysfunction    Gout    History of chicken pox    History of measles    History of mumps    IBS (irritable bowel syndrome)    MRSA infection 2000   nasal   Skin cancer (melanoma) ([place]) 2021   left arm    Patient Active Problem List   Diagnosis Date Noted   Anxiety and depression 11/21/2022   Osteoporosis 06/19/2022   Compression fracture of third lumbar vertebra (HCC) 04/11/2022   Closed compression fracture of first lumbar vertebra ([place]) 04/11/2022   Hyperglycemia 12/14/2016   Arthritis 07/16/2016     09/28/2015   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 Screening ultrasound at [facility] per  pt report   BCC Excised from chest  1984   BUNIONECTOMY Right    great toe   CATARACT EXTRACTION, [place]  1964-1987   2 on right/1 on left   IR KYPHO LUMBAR INC FX REDUCE BONE BX UNI/BIL CANNULATION INC/IMAGING  02/16/2022   IR RADIOLOGIST EVAL & MGMT  01/30/2022   IR RADIOLOGIST EVAL & MGMT  02/27/2022   KYPHOPLASTY N/A 04/03/2021   Procedure: L1 KYPHOPLASTY AND BONE BIOPDY;  Surgeon: Hessie Knows, MD;  Location: [facility] ORS;  Service: Orthopedics;  Laterality: N/A;   LIPOMA EXCISION  2013   NOSE SURGERY     divided septum   PARTIAL KNEE ARTHROPLASTY Right 01/10/2021   Procedure: UNICOMPARTMENTAL KNEE;  Surgeon: Corky Mull, MD;  Location: [facility] ORS;  Service: Orthopedics;  Laterality: Right;   PENILE PROSTHESIS IMPLANT  12/17/2013   POLYPECTOMY     PROSTATE SURGERY     Rezum procedure done in office   [place]   left X2 for spurs   TONSILLECTOMY AND ADENOIDECTOMY  0300   UMBILICAL HERNIA REPAIR         Home Medications    Prior to Admission medications   Medication Sig Start Date End Date Taking? Authorizing Provider  alendronate (FOSAMAX) 70 MG tablet  Take 1 tablet (70 mg total) by mouth once a week. Take with a full glass of water on an empty stomach. 06/21/22   Birdie Sons, MD  allopurinol (ZYLOPRIM) 300 MG tablet Take 1 tablet (300 mg total) by mouth in the morning. Patient taking differently: Take 150 mg by mouth in the morning. 10/24/21   Birdie Sons, MD  Bempedoic Acid 180 MG TABS Take 180 mg by mouth daily. Patient not taking: Reported on 12/14/2022 11/20/22 05/19/23  Kate Sable, MD  cetirizine (ZYRTEC) 10 MG tablet Take 10 mg by mouth in the morning.    [provider]  cimetidine (TAGAMET) 200 MG tablet Take 200 mg by mouth daily as needed (heartburn). Patient not taking: Reported on 10/22/2022    [provider]  Coenzyme  Q10 (COQ10) 100 MG CAPS Take 100 mg by mouth in the morning.    [provider]  Cyanocobalamin (VITAMIN B-12) 5000 MCG SUBL Place 5,000 mcg under the tongue in the morning.    [provider]  DERMOTIC 0.01 % OIL Apply 1 application  topically See admin instructions. 1 application in the ears once or twice weekly 09/13/20   [provider]  Evolocumab (REPATHA SURECLICK) 474 MG/ML SOAJ Inject 140 mg into the skin every 14 (fourteen) days. Patient not taking: Reported on 12/14/2022 11/01/22   Kate Sable, MD  famotidine (PEPCID) 20 MG tablet Take 20 mg by mouth daily as needed for heartburn or indigestion. Patient not taking: Reported on 10/22/2022    [provider]  fluticasone (FLONASE) 50 MCG/ACT nasal spray Place 1 spray into both nostrils daily as needed for allergies or rhinitis.    [provider]  HYDROcodone-acetaminophen (NORCO) 5-325 MG tablet Take 1 tablet by mouth every 6 (six) hours as needed for moderate pain. Patient not taking: Reported on 12/14/2022 04/03/21   Hessie Knows, MD  icosapent Ethyl (VASCEPA) 1 g capsule Take 2 capsules (2 g total) by mouth 2 (two) times daily. 12/14/22   Kate Sable, MD  L-Arginine 1000 MG TABS Take 1,000 mg by mouth 3 (three) times a week.    [provider]  Multiple Vitamin (MULTIVITAMIN WITH MINERALS) TABS tablet Take 1 tablet by mouth in the morning.    [provider]  Multiple Vitamins-Minerals (PRESERVISION AREDS 2 PO) Take 1 tablet by mouth in the morning.    [provider]  niacin 500 MG tablet Take 500 mg by mouth in the morning.    [provider]  sertraline (ZOLOFT) 25 MG tablet Take 1 tablet (25 mg total) by mouth daily. 11/21/22   Mikey Kirschner, PA-C  tadalafil (CIALIS) 5 MG tablet Take 1 tablet (5 mg total) by mouth every morning. 10/17/22   Birdie Sons, MD    Family History Family History  Problem Relation Age of Onset    Hypothyroidism Mother    Diabetes Brother    Obesity Brother    Cancer Brother        throat    Social History Social History   Tobacco Use   Smoking status: Former    Packs/day: 0.75    Years: 5.00    Total pack years: 3.75    Types: Cigarettes    Quit date: 11/19/1958    Years since quitting: 64.1   Smokeless tobacco: Never  Vaping Use   Vaping Use: Never used  Substance Use Topics   Alcohol use: Yes    Alcohol/week: 0.0 standard drinks of alcohol  Comment: 2 glasses of wine a month   Drug use: No     Allergies   Tetracyclines & related, Colestipol hcl, Corn-containing products, Pravastatin sodium, Shellfish allergy, Sulfa antibiotics, Welchol [colesevelam], and Zetia [ezetimibe]   Review of Systems Review of Systems  Constitutional:  Positive for fever. Negative for chills.  HENT:  Positive for congestion and sore throat. Negative for ear pain.   Respiratory:  Positive for cough. Negative for shortness of breath.   Cardiovascular:  Negative for chest pain and palpitations.  Gastrointestinal:  Negative for diarrhea and vomiting.  Skin:  Negative for color change and rash.  All other systems reviewed and are negative.    Physical Exam Triage Vital Signs ED Triage Vitals  Enc Vitals Group     BP 12/24/22 1330 (!) 145/84     Pulse Rate 12/24/22 1323 85     Resp 12/24/22 1323 18     Temp 12/24/22 1323 98.6 F (37 C)     Temp src --      SpO2 12/24/22 1323 95 %     Weight --      Height --      Head Circumference --      Peak Flow --      Pain Score 12/24/22 1325 6     Pain Loc --      Pain Edu? --      Excl. in [place]? --    No data found.  Updated Vital Signs BP (!) 145/84   Pulse 85   Temp 98.6 F (37 C)   Resp 18   SpO2 95%   Visual Acuity Right Eye Distance:   Left Eye Distance:   Bilateral Distance:    Right Eye Near:   Left Eye Near:    Bilateral Near:     Physical Exam Vitals and nursing note reviewed.  Constitutional:       General: He is not in acute distress.    Appearance: Normal appearance. He is well-developed. He is not ill-appearing.  HENT:     Right Ear: Tympanic membrane normal.     Left Ear: Tympanic membrane normal.     Nose: Nose normal.     Mouth/Throat:     Mouth: Mucous membranes are moist.     Pharynx: Oropharynx is clear.     Comments: Clear PND.  Cardiovascular:     Rate and Rhythm: Normal rate and regular rhythm.     Heart sounds: Normal heart sounds.  Pulmonary:     Effort: Pulmonary effort is normal. No respiratory distress.     Breath sounds: Normal breath sounds.  Musculoskeletal:     Cervical back: Neck supple.  Skin:    General: Skin is warm and dry.  Neurological:     Mental Status: He is alert.  Psychiatric:        Mood and Affect: Mood normal.        Behavior: Behavior normal.      UC Treatments / Results  Labs (all labs ordered are listed, but only abnormal results are displayed) Labs Reviewed  SARS CORONAVIRUS 2 (TAT 6-24 HRS)  POCT RAPID STREP A (OFFICE)    EKG   Radiology No results found.  Procedures Procedures (including critical care time)  Medications Ordered in UC Medications - No data to display  Initial Impression / Assessment and Plan / UC Course  I have reviewed the triage vital signs and the nursing notes.  Pertinent labs & imaging results  that were available during my care of the patient were reviewed by me and considered in my medical decision making (see chart for details).   Viral URI.  Rapid strep negative.  COVID pending.  Discussed symptomatic treatment including Tylenol, rest, hydration.  Education provided on viral URI.  Instructed patient to follow up with PCP if symptoms are not improving.  He agrees to plan of care.    Final Clinical Impressions(s) / UC Diagnoses   Final diagnoses:  Viral URI     Discharge Instructions      Your strep test is negative.  Your COVID test is pending.    Take Tylenol as needed for  fever or discomfort.  Rest and keep yourself hydrated.    Follow-up with your primary care provider if your symptoms are not improving.         ED Prescriptions   None    PDMP not reviewed this encounter.   Sharion Balloon, NP 12/24/22 [phone]

## 2022-12-25 ENCOUNTER — Telehealth: Payer: Self-pay | Admitting: Emergency Medicine

## 2022-12-25 ENCOUNTER — Encounter: Payer: Self-pay | Admitting: *Deleted

## 2022-12-25 ENCOUNTER — Ambulatory Visit: Payer: Self-pay | Admitting: *Deleted

## 2022-12-25 LAB — SARS CORONAVIRUS 2 (TAT 6-24 HRS): SARS Coronavirus 2: POSITIVE — AB

## 2022-12-25 MED ORDER — NIRMATRELVIR/RITONAVIR (PAXLOVID) TABLET (RENAL DOSING): 2.0000 | ORAL_TABLET | Freq: Two times a day (BID) | ORAL | 0 refills | Status: AC

## 2022-12-25 MED ORDER — MOLNUPIRAVIR EUA 200MG CAPSULE: 4.0000 | ORAL_CAPSULE | Freq: Two times a day (BID) | ORAL | 0 refills | Status: DC

## 2022-12-25 NOTE — Telephone Encounter (Signed)
Message from Shan Levans sent at 12/25/2022  9:45 AM EST  Summary: Covid+   Patient was seen in urgent care yesterday and tested positive for covid and was given molnupiravir EUA (LAGEVRIO) 200 mg CAPS capsule. Patient states that his insurance is not wanting to cover this medication and is requesting pcp send in an rx for Paxlovid. Patient states he has had a low grade fever, congestion, sore throat and cough since Saturday.          Call History   Type Contact Phone/Fax User  12/25/2022 09:40 AM EST Phone (Incoming) Rodrigues, [place] (Self) [phone] Lemmie Evens) Mabe, Sherie Don   Reason for Disposition  Prescription request for new medicine (not a refill)    Positive Covid but insurance will not cover Lagevrio 200 mg.   Would Dr. Caryn Section be willing to prescribe Paxlovid?  Answer Assessment - Initial Assessment Questions 1. NAME of MEDICINE: "What medicine(s) are you calling about?"     Lagervrio 200 mg  2. QUESTION: "What is your question?" (e.g., double dose of medicine, side effect)     He was seen yesterday at the [facility] and tested positive for Covid.   Prescribed Lagevrio 200 mg but his insurance doesn't cover it.   Would Dr. Caryn Section be willing to prescribe the Paxlovid for him?  3. PRESCRIBER: "Who prescribed the medicine?" Reason: if prescribed by specialist, call should be referred to that group.     Provider at the urgent care yesterday 4. SYMPTOMS: "Do you have any symptoms?" If Yes, ask: "What symptoms are you having?"  "How bad are the symptoms (e.g., mild, moderate, severe)     Low grade fever, congestion, sore throat and cough since Saturday. 5. PREGNANCY:  "Is there any chance that you are pregnant?" "When was your last menstrual period?"     N/A  Protocols used: Medication Question Call-A-AH

## 2022-12-25 NOTE — Telephone Encounter (Signed)
  Chief Complaint: Would Dr. Caryn Section prescribe Paxlovid for him?   Seen yesterday at urgent care.   Prescribed Lagevrio 200 mg but his insurance will not cover it.   Tested positive for Covid Symptoms: low grade fever, congestion, sore throat and cough since Saturday Frequency: Symptoms started Sat. Pertinent Negatives: Patient denies N/A Disposition: '[]'$ ED /'[]'$ Urgent Care (no appt availability in office) / '[]'$ Appointment(In office/virtual)/ '[]'$  [place] Virtual Care/ '[]'$ Home Care/ '[]'$ Refused Recommended Disposition /'[]'$  Mobile Bus/ '[x]'$  Follow-up with PCP Additional Notes: Pt's request for Paxlovid sent to [facility] for Dr. Caryn Section.

## 2022-12-25 NOTE — Telephone Encounter (Signed)
This encounter was created in error - please disregard.

## 2022-12-25 NOTE — Telephone Encounter (Signed)
Telephone call from patient to discuss positive COVID test result.  Discussed treatment options.  Treating with molnupiravir.  Discussed that this is an emergency authorized medication for treatment of COVID.  Discussed side effects including nausea, diarrhea, dizziness.  Discussed other symptomatic treatment including Tylenol, rest, hydration.  Instructed patient to follow-up with his PCP if symptoms are not improving.  ED precautions discussed.  Patient agrees to plan of care.

## 2022-12-25 NOTE — Telephone Encounter (Signed)
Have sent prescription to [facility]

## 2022-12-25 NOTE — Addendum Note (Signed)
Addended by: Birdie Sons on: 12/25/2022 02:17 PM   Modules accepted: Orders

## 2022-12-25 NOTE — Telephone Encounter (Signed)
Patient returned call and is requesting PCP to please advise which medication can be taken for positive covid  sx. Reports UC visit provider reports he could not be prescribed paxlovid due to not having a kidney function test within the past year. Please advise .

## 2023-01-02 ENCOUNTER — Ambulatory Visit: Payer: Medicare Other | Admitting: Family Medicine

## 2023-01-10 DIAGNOSIS — K219 Gastro-esophageal reflux disease without esophagitis: Secondary | ICD-10-CM | POA: Diagnosis not present

## 2023-01-10 DIAGNOSIS — R070 Pain in throat: Secondary | ICD-10-CM | POA: Diagnosis not present

## 2023-01-10 DIAGNOSIS — J019 Acute sinusitis, unspecified: Secondary | ICD-10-CM | POA: Diagnosis not present

## 2023-01-10 NOTE — Progress Notes (Signed)
Established patient visit   Patient: Larry Crawford   DOB: 1941-03-27   82 y.o. Male  MRN: [id number] Visit Date: 01/11/2023  Today's healthcare provider: Lelon Huh, MD   Chief Complaint  Patient presents with   Depression   Subjective    HPI  Prediabetes, Follow-up  Lab Results  Component Value Date   HGBA1C 6.2 (H) 09/21/2022   HGBA1C 5.7 (H) 10/09/2021   HGBA1C 5.9 (H) 01/15/2018   GLUCOSE 101 (H) 10/09/2021   GLUCOSE 115 (H) 01/03/2021   GLUCOSE 94 11/23/2020    Last seen for for this3 months ago.  Management since that visit includes none.  Current diet:  general Current exercise: none  Pertinent Labs:    Component Value Date/Time   CHOL 304 (H) 09/21/2022 1050   TRIG 250 (H) 09/21/2022 1050   CHOLHDL 7.6 (H) 09/21/2022 1050   CREATININE 1.3 03/30/2022 0000   CREATININE 1.36 (H) 10/09/2021 1422    Wt Readings from Last 3 Encounters:  01/11/23 199 lb (90.3 kg)  12/14/22 201 lb 9.6 oz (91.4 kg)  11/21/22 203 lb (92.1 kg)    ----------------------------------------------------------------------------------------- He is also here to follow up an fatigue, anxiety and depression, prescribed sertraline in early January by Mikey Kirschner, PAC. States he felt better for several days after starting medication, but doesn't seem to be helping any more. He feels fatigued during the day and will fall asleep easily after eating. He gets a little short of breath when walking long distances. No chest pains.   He has been to see cardiology for lipid management and prescribed Repatha which his insurance would not cover. Prior to that he was prescribed Bempedoic acid but had to stop taking it due to it causing severe headaches. Prior to that he tried multiple statins, ezetimibe and cholestyramine and did not tolerate any of them. He states the cardiologist referred him to a lipidologist in [place].   Medications: Outpatient Medications Prior to Visit   Medication Sig   alendronate (FOSAMAX) 70 MG tablet Take 1 tablet (70 mg total) by mouth once a week. Take with a full glass of water on an empty stomach.   allopurinol (ZYLOPRIM) 300 MG tablet Take 1 tablet (300 mg total) by mouth in the morning. (Patient taking differently: Take 150 mg by mouth in the morning.)   cetirizine (ZYRTEC) 10 MG tablet Take 10 mg by mouth in the morning.   Coenzyme Q10 (COQ10) 100 MG CAPS Take 100 mg by mouth in the morning.   Cyanocobalamin (VITAMIN B-12) 5000 MCG SUBL Place 5,000 mcg under the tongue in the morning.   DERMOTIC 0.01 % OIL Apply 1 application  topically See admin instructions. 1 application in the ears once or twice weekly   Evolocumab (REPATHA SURECLICK) [id number] MG/ML SOAJ Inject 140 mg into the skin every 14 (fourteen) days.   famotidine (PEPCID) 20 MG tablet Take 20 mg by mouth daily as needed for heartburn or indigestion.   fluticasone (FLONASE) 50 MCG/ACT nasal spray Place 1 spray into both nostrils daily as needed for allergies or rhinitis.   HYDROcodone-acetaminophen (NORCO) 5-325 MG tablet Take 1 tablet by mouth every 6 (six) hours as needed for moderate pain.   icosapent Ethyl (VASCEPA) 1 g capsule Take 2 capsules (2 g total) by mouth 2 (two) times daily.   L-Arginine 1000 MG TABS Take 1,000 mg by mouth 3 (three) times a week.   Multiple Vitamin (MULTIVITAMIN WITH MINERALS) TABS tablet Take 1  tablet by mouth in the morning.   Multiple Vitamins-Minerals (PRESERVISION AREDS 2 PO) Take 1 tablet by mouth in the morning.   niacin 500 MG tablet Take 500 mg by mouth in the morning.   sertraline (ZOLOFT) 25 MG tablet Take 1 tablet (25 mg total) by mouth daily.   tadalafil (CIALIS) 5 MG tablet Take 1 tablet (5 mg total) by mouth every morning.   [DISCONTINUED] cimetidine (TAGAMET) 200 MG tablet Take 200 mg by mouth daily as needed (heartburn).   Bempedoic Acid 180 MG TABS Take 180 mg by mouth daily. (Patient not taking: Reported on 12/14/2022)   No  facility-administered medications prior to visit.    Review of Systems  Cardiovascular:  Negative for chest pain, palpitations and leg swelling.  Endocrine: Negative for polydipsia and polyuria.        Objective    BP 138/68 (BP Location: Right Arm, Patient Position: Sitting, Cuff Size: Normal)   Pulse 73   Temp 97.7 F (36.5 C) (Oral)   Wt 199 lb (90.3 kg)   SpO2 99%   BMI 29.39 kg/m    Physical Exam   General: Appearance:    Well developed, well nourished male in no acute distress  Eyes:    PERRL, conjunctiva/corneas clear, EOM's intact       Lungs:     Clear to auscultation bilaterally, respirations unlabored  Heart:    Normal heart rate. Normal rhythm. No murmurs, rubs, or gallops.    MS:   All extremities are intact.    Neurologic:   Awake, alert, oriented x 3. No apparent focal neurological defect.        Results for orders placed or performed in visit on 01/11/23  POCT glycosylated hemoglobin (Hb A1C)  Result Value Ref Range   Hemoglobin A1C 6.4 (A) 4.0 - 5.6 %   Est. average glucose Bld gHb Est-mCnc 137     Assessment & Plan    1. Anxiety and depression Increase sertraline to 2 x '25mg'$  daily. Will send in prescription for '50mg'$  tablets in 2-3 weeks.   2. Hyperglycemia Getting closer to diabetic range, is to work on diet and exercise.   3. Hypersomnia  - Ambulatory referral to Sleep Studies  4. Other fatigue  - Ambulatory referral to Sleep Studies   5. Hyperlipidemia Intolerant to multiple classes of oral medications, has been referred to lipidologist by Dr. Garen Lah.  Future Appointments  Date Time Provider [place]  02/04/2023  2:00 PM Pixie Casino, MD [facility] None  02/22/2023  9:40 AM Birdie Sons, MD [facility]  04/19/2023  9:40 AM Kate Sable, MD [facility] None  10/24/2023  8:45 AM [facility]-NURSE HEALTH ADVISOR [facility]        The entirety of the information documented in the History of Present Illness, Review of  Systems and Physical Exam were personally obtained by me. Portions of this information were initially documented by the CMA and reviewed by me for thoroughness and accuracy.     Lelon Huh, MD  [place] [phone] (phone) [phone] (fax)  [place]

## 2023-01-11 ENCOUNTER — Ambulatory Visit (INDEPENDENT_AMBULATORY_CARE_PROVIDER_SITE_OTHER): Payer: Medicare Other | Admitting: Family Medicine

## 2023-01-11 VITALS — BP 138/68 | HR 73 | Temp 97.7°F | Wt 199.0 lb

## 2023-01-11 DIAGNOSIS — R739 Hyperglycemia, unspecified: Secondary | ICD-10-CM | POA: Diagnosis not present

## 2023-01-11 DIAGNOSIS — F32A Depression, unspecified: Secondary | ICD-10-CM

## 2023-01-11 DIAGNOSIS — F419 Anxiety disorder, unspecified: Secondary | ICD-10-CM | POA: Diagnosis not present

## 2023-01-11 DIAGNOSIS — R5383 Other fatigue: Secondary | ICD-10-CM | POA: Diagnosis not present

## 2023-01-11 DIAGNOSIS — G4710 Hypersomnia, unspecified: Secondary | ICD-10-CM

## 2023-01-11 LAB — POCT GLYCOSYLATED HEMOGLOBIN (HGB A1C)
Est. average glucose Bld gHb Est-mCnc: 137
Hemoglobin A1C: 6.4 % — AB (ref 4.0–5.6)

## 2023-01-11 NOTE — Patient Instructions (Addendum)
Please review the attached list of medications and notify my office if there are any errors.   Increase dose of sertraline to '50mg'$  by taking 2 tablets every day  We'll place an order for a home sleep study

## 2023-01-14 ENCOUNTER — Telehealth: Payer: Self-pay | Admitting: Family Medicine

## 2023-01-14 NOTE — Telephone Encounter (Signed)
Pt called reporting that he has been instructed to check his blood sugar but has not received a glucose monitor. Pt is requesting a glucose monitoring kit.    [facility] [id number] [address], [place] AT [facility] OF [address]  [place] [address]  Phone: [phone] Fax: [phone]  Hours: Not open 24 hours

## 2023-01-15 ENCOUNTER — Other Ambulatory Visit: Payer: Self-pay

## 2023-01-15 DIAGNOSIS — R739 Hyperglycemia, unspecified: Secondary | ICD-10-CM

## 2023-01-15 MED ORDER — BLOOD GLUCOSE TEST VI STRP: 1.0000 | ORAL_STRIP | Freq: Three times a day (TID) | 0 refills | Status: DC

## 2023-01-15 MED ORDER — BLOOD GLUCOSE MONITORING SUPPL DEVI: 1.0000 | Freq: Three times a day (TID) | 0 refills | Status: DC

## 2023-01-15 MED ORDER — LANCETS MISC. MISC: 1.0000 | Freq: Three times a day (TID) | 0 refills | Status: AC

## 2023-01-15 MED ORDER — LANCET DEVICE MISC: 1.0000 | Freq: Three times a day (TID) | 0 refills | Status: AC

## 2023-01-15 NOTE — Telephone Encounter (Signed)
Rx sent to pharmacy   

## 2023-01-24 ENCOUNTER — Other Ambulatory Visit: Payer: Self-pay | Admitting: Family Medicine

## 2023-01-24 DIAGNOSIS — F419 Anxiety disorder, unspecified: Secondary | ICD-10-CM

## 2023-01-24 DIAGNOSIS — F32A Depression, unspecified: Secondary | ICD-10-CM

## 2023-01-24 MED ORDER — SERTRALINE HCL 50 MG PO TABS: 50.0000 mg | ORAL_TABLET | Freq: Every day | ORAL | 2 refills | Status: DC

## 2023-02-04 ENCOUNTER — Encounter: Payer: Self-pay | Admitting: Internal Medicine

## 2023-02-04 ENCOUNTER — Ambulatory Visit: Payer: Medicare Other | Attending: Internal Medicine | Admitting: Internal Medicine

## 2023-02-04 ENCOUNTER — Telehealth: Payer: Self-pay | Admitting: Internal Medicine

## 2023-02-04 VITALS — HR 84 | Ht 68.0 in | Wt 199.8 lb

## 2023-02-04 DIAGNOSIS — M791 Myalgia, unspecified site: Secondary | ICD-10-CM | POA: Insufficient documentation

## 2023-02-04 DIAGNOSIS — T466X5A Adverse effect of antihyperlipidemic and antiarteriosclerotic drugs, initial encounter: Secondary | ICD-10-CM | POA: Diagnosis not present

## 2023-02-04 DIAGNOSIS — E7800 Pure hypercholesterolemia, unspecified: Secondary | ICD-10-CM | POA: Insufficient documentation

## 2023-02-04 NOTE — Progress Notes (Signed)
LIPID CLINIC CONSULT NOTE  Chief Complaint:  Manage dyslipidemia  Primary Care Physician: Birdie Sons, MD  Primary Cardiologist:  Kate Sable, MD  HPI:  Larry Crawford is a 82 y.o. male who is being seen today for the evaluation of dyslipidemia at the request of Agbor-Etang, Aaron Edelman, MD. This is a pleasant 82 year old male kindly referred for evaluation management of dyslipidemia.  He reports some longstanding history of high cholesterol and has several brothers with high cholesterol.  There is no known coronary artery disease.  He has been followed at the [place] as well.  He had been referred to see Dr. Garen Lah for lipid management.  Subsequently he has been on statins, ezetimibe and bempedoic acid, all of which caused significant side effects.  He was then prescribed for Repatha however this was not approved by insurance for unknown reasons.  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  That would fit with the family history of high cholesterol and his brothers as well as his personal cholesterol which was elevated in November 2023 to total 304, HDL 40, triglycerides 250 and LDL 214.  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 Screening ultrasound at [facility] per pt report   BCC Excised from chest  1984   BUNIONECTOMY Right    great toe   CATARACT EXTRACTION, [place]  [phone]   2 on right/1 on left   IR KYPHO LUMBAR INC FX REDUCE BONE BX UNI/BIL CANNULATION INC/IMAGING  02/16/2022   IR RADIOLOGIST EVAL  & MGMT  01/30/2022   IR RADIOLOGIST EVAL & MGMT  02/27/2022   KYPHOPLASTY N/A 04/03/2021   Procedure: L1 KYPHOPLASTY AND BONE BIOPDY;  Surgeon: Hessie Knows, MD;  Location: [facility] ORS;  Service: Orthopedics;  Laterality: N/A;   LIPOMA EXCISION  2013   NOSE SURGERY     divided septum   PARTIAL KNEE ARTHROPLASTY Right 01/10/2021   Procedure: UNICOMPARTMENTAL KNEE;  Surgeon: Corky Mull, MD;  Location: [facility] ORS;  Service: Orthopedics;  Laterality: Right;   PENILE PROSTHESIS IMPLANT  12/17/2013   POLYPECTOMY     PROSTATE SURGERY     Rezum procedure done in office   [place]   left X2 for spurs   TONSILLECTOMY AND ADENOIDECTOMY  [id number]   UMBILICAL HERNIA REPAIR      FAMHx:  Family History  Problem Relation Age of Onset   Hypothyroidism Mother    Diabetes Brother    Obesity Brother    Cancer Brother        throat    SOCHx:   reports that he quit smoking about 64 years ago. His smoking use included cigarettes. He has a 3.75 pack-year smoking history. He has never used smokeless tobacco. He reports current alcohol use. He reports that he  does not use drugs.  ALLERGIES:  Allergies  Allergen Reactions   Tetracyclines & Related Shortness Of Breath and Rash   Bempedoic Acid Other (See Comments)    headache   Colestipol Hcl Diarrhea    Fatigue   Corn-Containing Products     Diarrhea and "makes my insides hurt"    Pravastatin Sodium     Muscle Pain   Shellfish Allergy     Causes gout    Sulfa Antibiotics Hives   Welchol [Colesevelam]     Stomach pain   Zetia [Ezetimibe]     Didn't feel good when he was taking it.     ROS: Pertinent items noted in HPI and remainder of comprehensive ROS otherwise negative.  HOME MEDS: Current Outpatient Medications on File Prior to Visit  Medication Sig Dispense Refill   alendronate (FOSAMAX) 70 MG tablet Take 1 tablet (70 mg total) by mouth once a week. Take with a full glass of water on an empty  stomach. 12 tablet 3   allopurinol (ZYLOPRIM) 300 MG tablet Take 1 tablet (300 mg total) by mouth in the morning. (Patient taking differently: Take 150 mg by mouth in the morning.) 90 tablet 3   Blood Glucose Monitoring Suppl (TRUE METRIX METER) w/Device KIT See admin instructions.     Blood Glucose Monitoring Suppl DEVI 1 each by Does not apply route in the morning, at noon, and at bedtime. May substitute to any manufacturer covered by patient's insurance. 1 each 0   cetirizine (ZYRTEC) 10 MG tablet Take 10 mg by mouth in the morning.     Coenzyme Q10 (COQ10) 100 MG CAPS Take 100 mg by mouth in the morning.     Cyanocobalamin (VITAMIN B-12) 5000 MCG SUBL Place 5,000 mcg under the tongue in the morning.     DERMOTIC 0.01 % OIL Apply 1 application  topically See admin instructions. 1 application in the ears once or twice weekly     Elderberry 500 MG CAPS Take by mouth.     famotidine (PEPCID) 20 MG tablet Take 20 mg by mouth daily as needed for heartburn or indigestion.     fluticasone (FLONASE) 50 MCG/ACT nasal spray Place 1 spray into both nostrils daily as needed for allergies or rhinitis.     Glucose Blood (BLOOD GLUCOSE TEST STRIPS) STRP 1 each by In Vitro route in the morning, at noon, and at bedtime. May substitute to any manufacturer covered by patient's insurance. 100 strip 0   HYDROcodone-acetaminophen (NORCO) 5-325 MG tablet Take 1 tablet by mouth every 6 (six) hours as needed for moderate pain. 15 tablet 0   L-Arginine 1000 MG TABS Take 1,000 mg by mouth 3 (three) times a week.     Lancet Device MISC 1 each by Does not apply route in the morning, at noon, and at bedtime. May substitute to any manufacturer covered by patient's insurance. 1 each 0   Lancets Misc. MISC 1 each by Does not apply route in the morning, at noon, and at bedtime. May substitute to any manufacturer covered by patient's insurance. 100 each 0   Multiple Vitamin (MULTIVITAMIN WITH MINERALS) TABS tablet Take 1 tablet by  mouth in the morning.     Multiple Vitamins-Minerals (PRESERVISION AREDS 2 PO) Take 1 tablet by mouth in the morning.     niacin 500 MG tablet Take 500 mg by mouth in the morning.     sertraline (ZOLOFT) 50 MG tablet Take 1 tablet (50 mg total) by mouth  daily. 90 tablet 2   tadalafil (CIALIS) 5 MG tablet Take 1 tablet (5 mg total) by mouth every morning. 90 tablet 3   No current facility-administered medications on file prior to visit.    LABS/IMAGING: No results found for this or any previous visit (from the past 48 hour(s)). No results found.  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   Mr. 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  He does have high cholesterol in several brothers and has been intolerant to statins, ezetimibe and bempedoic acid.  Options are quite limited.  A PCSK9 inhibitor is quite reasonable and he is a veteran, therefore coverage with [employer] may be excellent for Leqvio.  We discussed the RNA inhibitor Leqvio including mechanism of action and very low likelihood of side effects as well as every 83-month dosing and he was interested in pursuing this.  Will reach out for prior authorization.   He will likely be scheduled for injections in our area as we have not yet established this with other  facilities.  Follow-up with me in about 4 to 6 months. Thanks again for the kind referral.  Pixie Casino, MD, FACC, [place] Director of the Advanced Lipid Disorders &  Cardiovascular Risk Reduction Clinic Diplomate of the American Board of Clinical Lipidology Attending Cardiologist  Direct Dial: [phone]  Fax: [phone]  Website:  [email or web address] 02/04/2023, 2:30 PM

## 2023-02-04 NOTE — Telephone Encounter (Signed)
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

## 2023-02-04 NOTE — Patient Instructions (Addendum)
Medication Instructions:  Your Leqvio injection appointment will be scheduled at [place].  [address] [place],  [place]  [address]  Marion Downer is a subcutaneous injection that will lower your LDL cholesterol by 50%. If this is your first injection, your next injection will be due in 3 months. If this is NOT your first injection, your next injection will be due in 6 months.  *If you need a refill on your cardiac medications before your next appointment, please call your pharmacy*   Lab Work: FASTING lab work to check cholesterol in about 6 months ** complete about 1 week before your next visit  If you have labs (blood work) drawn today and your tests are completely normal, you will receive your results only by: [place] (if you have MyChart) OR A paper copy in the mail If you have any lab test that is abnormal or we need to change your treatment, we will call you to review the results.    Follow-Up: At [facility], you and your health needs are our priority.  As part of our continuing mission to provide you with exceptional heart care, we have created designated Provider Care Teams.  These Care Teams include your primary Cardiologist (physician) and Advanced Practice Providers (APPs -  Physician Assistants and Nurse Practitioners) who all work together to provide you with the care you need, when you need it.  We recommend signing up for the patient portal called "MyChart".  Sign up information is provided on this After Visit Summary.  MyChart is used to connect with patients for Virtual Visits (Telemedicine).  Patients are able to view lab/test results, encounter notes, upcoming appointments, etc.  Non-urgent messages can be sent to your provider as well.   To learn more about what you can do with MyChart, go to [email or web address].    Your next appointment:    6 months with Dr. Debara Pickett

## 2023-02-06 ENCOUNTER — Telehealth: Payer: Self-pay | Admitting: Internal Medicine

## 2023-02-06 NOTE — Telephone Encounter (Signed)
Called and LVM as directed to [place] extension but no answer

## 2023-02-06 NOTE — Telephone Encounter (Signed)
Calling in regards to missing info on pt's "start form" for medication. Larry Crawford states that the "Prescribers Certification box" needs to be filled out. Please advise

## 2023-02-13 ENCOUNTER — Encounter: Payer: Self-pay | Admitting: Internal Medicine

## 2023-02-13 ENCOUNTER — Telehealth: Payer: Self-pay | Admitting: Pharmacy Technician

## 2023-02-13 NOTE — Telephone Encounter (Signed)
Auth Submission: NO AUTH NEEDED Site of care: Site of care: [facility] Payer: medicare a/b & tricare Medication & CPT/J Code(s) submitted: Leqvio (Inclisiran) [id number] Route of submission (phone, fax, portal):  Phone # Fax # Auth type: Buy/Bill Units/visits requested: 3 Reference number:  Approval from: 02/13/23 to 02/13/24

## 2023-02-13 NOTE — Telephone Encounter (Signed)
Patient has been identified as candidate for Leqvio  Benefits investigation enrollment completed on 02/08/23  Benefits investigation report notes the following:  Type of insurance: Medicare/Tricare  OOP Max: none / Met: n/a  Deductible: $240  Co-insurance: 20%  PA required: NO  PA phone number: N/A  Benefits Summary Details:  Primary -  Medicare part B covers Leqvio at 80% after patient meets $240 deductible - once this is met, patient responsibility is 20%. The patient has met $240 towards deductible. No PA needed  Secondary -  Tricare for Life - this covers Medicare part B coinsurance at 100%

## 2023-02-13 NOTE — Addendum Note (Signed)
Addended by: Fidel Levy on: 02/13/2023 01:28 PM   Modules accepted: Orders

## 2023-02-16 ENCOUNTER — Other Ambulatory Visit: Payer: Self-pay | Admitting: Family Medicine

## 2023-02-16 DIAGNOSIS — R739 Hyperglycemia, unspecified: Secondary | ICD-10-CM

## 2023-02-18 ENCOUNTER — Ambulatory Visit: Payer: Medicare Other

## 2023-02-18 NOTE — Telephone Encounter (Signed)
Requested Prescriptions  Pending Prescriptions Disp Refills   TRUE METRIX BLOOD GLUCOSE TEST test strip [Pharmacy Med Name: TRUE METRIX B/G TEST STRIPS 50S] 100 strip 0    Sig: TEST THREE TIMES DAILY(MORNING, NOON AND AT BEDTIME)     Endocrinology: Diabetes - Testing Supplies Passed - 02/16/2023  7:41 AM      Passed - Valid encounter within last 12 months    Recent Outpatient Visits           1 month ago Anxiety and depression   [place], Donald E, MD   2 months ago Anxiety and depression   [place] Mikey Kirschner, PA-C   5 months ago Hyperglycemia   [facility] Birdie Sons, MD   10 months ago Mixed hyperlipidemia   [facility] Birdie Sons, MD   2 years ago Mixed hyperlipidemia   [place], Donald E, MD       Future Appointments             In 4 days Fisher, Kirstie Peri, MD [facility] [place]   In 2 months Agbor-Etang, Aaron Edelman, MD [place] at [place]   In 4 months Hilty, Nadean Corwin, MD [place] at [facility]

## 2023-02-18 NOTE — Telephone Encounter (Signed)
Leqvio injection 02/20/23

## 2023-02-20 ENCOUNTER — Ambulatory Visit (INDEPENDENT_AMBULATORY_CARE_PROVIDER_SITE_OTHER): Payer: Medicare Other

## 2023-02-20 VITALS — BP 146/79 | HR 68 | Temp 98.2°F | Resp 16 | Ht 68.0 in | Wt 200.4 lb

## 2023-02-20 DIAGNOSIS — R3914 Feeling of incomplete bladder emptying: Secondary | ICD-10-CM | POA: Diagnosis not present

## 2023-02-20 DIAGNOSIS — R3915 Urgency of urination: Secondary | ICD-10-CM | POA: Diagnosis not present

## 2023-02-20 DIAGNOSIS — E782 Mixed hyperlipidemia: Secondary | ICD-10-CM

## 2023-02-20 DIAGNOSIS — N401 Enlarged prostate with lower urinary tract symptoms: Secondary | ICD-10-CM | POA: Diagnosis not present

## 2023-02-20 DIAGNOSIS — R351 Nocturia: Secondary | ICD-10-CM | POA: Diagnosis not present

## 2023-02-20 MED ORDER — INCLISIRAN SODIUM 284 MG/1.5ML ~~LOC~~ SOSY
284.0000 mg | PREFILLED_SYRINGE | Freq: Once | SUBCUTANEOUS | Status: AC
  Administered 2023-02-20: 284 mg via SUBCUTANEOUS
  Filled 2023-02-20: qty 1.5

## 2023-02-20 NOTE — Progress Notes (Signed)
I,Sha'taria Tyson,acting as a Neurosurgeon for Mila Merry, MD.,have documented all relevant documentation on the behalf of Mila Merry, MD,as directed by  Mila Merry, MD while in the presence of Mila Merry, MD.   Established patient visit   Patient: Larry Crawford   DOB: Apr 14, 1941   82 y.o. Male  MRN: [id number] Visit Date: 02/22/2023  Today's healthcare provider: Mila Merry, MD   Chief Complaint  Patient presents with   Depression   Subjective    HPI  Anxiety and Depression, Follow-up  He was last seen for anxiety 1 months ago. Changes made at last visit include increasing Sertraline to 50 mg daily.   He reports excellent compliance with treatment. He reports excellent tolerance of treatment. He is not having side effects.   He feels his anxiety is "can't really say" and Unchanged since last visit. He reports continues to have vivid dreams and stressful dreams a couple nights a week.   Symptoms: Yes chest pain No difficulty concentrating  Yes dizziness Yes fatigue  No feelings of losing control Yes insomnia  No irritable No palpitations  No panic attacks No racing thoughts  Yes shortness of breath Yes sweating  No tremors/shakes    GAD-7 Results    02/22/2023    9:46 AM 11/21/2022    2:44 PM  GAD-7 Generalized Anxiety Disorder Screening Tool  1. Feeling Nervous, Anxious, or on Edge 1 2  2. Not Being Able to Stop or Control Worrying 1 2  3. Worrying Too Much About Different Things 1 3  4. Trouble Relaxing 0 2  5. Being So Restless it's Hard To Sit Still 1 2  6. Becoming Easily Annoyed or Irritable 0 2  7. Feeling Afraid As If Something Awful Might Happen 0 3  Total GAD-7 Score 4 16  Difficulty At Work, Home, or Getting  Along With Others? Not difficult at all Somewhat difficult    PHQ-9 Scores    02/22/2023    9:43 AM 11/21/2022    2:44 PM 10/22/2022    9:04 AM  PHQ9 SCORE ONLY  PHQ-9 Total Score 14 16 4      ---------------------------------------------------------------------------------------------------   Medications: Outpatient Medications Prior to Visit  Medication Sig   alendronate (FOSAMAX) 70 MG tablet Take 1 tablet (70 mg total) by mouth once a week. Take with a full glass of water on an empty stomach.   allopurinol (ZYLOPRIM) 300 MG tablet Take 1 tablet (300 mg total) by mouth in the morning. (Patient taking differently: Take 150 mg by mouth in the morning.)   Blood Glucose Monitoring Suppl (TRUE METRIX METER) w/Device KIT See admin instructions.   Blood Glucose Monitoring Suppl DEVI 1 each by Does not apply route in the morning, at noon, and at bedtime. May substitute to any manufacturer covered by patient's insurance.   cetirizine (ZYRTEC) 10 MG tablet Take 10 mg by mouth in the morning.   Coenzyme Q10 (COQ10) 100 MG CAPS Take 100 mg by mouth in the morning.   Cyanocobalamin (VITAMIN B-12) 5000 MCG SUBL Place 5,000 mcg under the tongue in the morning.   DERMOTIC 0.01 % OIL Apply 1 application  topically See admin instructions. 1 application in the ears once or twice weekly   Elderberry 500 MG CAPS Take by mouth.   famotidine (PEPCID) 20 MG tablet Take 20 mg by mouth daily as needed for heartburn or indigestion.   fluticasone (FLONASE) 50 MCG/ACT nasal spray Place 1 spray into both nostrils daily  as needed for allergies or rhinitis.   HYDROcodone-acetaminophen (NORCO) 5-325 MG tablet Take 1 tablet by mouth every 6 (six) hours as needed for moderate pain.   L-Arginine 1000 MG TABS Take 1,000 mg by mouth 3 (three) times a week.   Multiple Vitamin (MULTIVITAMIN WITH MINERALS) TABS tablet Take 1 tablet by mouth in the morning.   Multiple Vitamins-Minerals (PRESERVISION AREDS 2 PO) Take 1 tablet by mouth in the morning.   niacin 500 MG tablet Take 500 mg by mouth in the morning.   sertraline (ZOLOFT) 50 MG tablet Take 1 tablet (50 mg total) by mouth daily.   tadalafil (CIALIS) 5 MG  tablet Take 1 tablet (5 mg total) by mouth every morning.   TRUE METRIX BLOOD GLUCOSE TEST test strip TEST THREE TIMES DAILY(MORNING, NOON AND AT BEDTIME)   Leqvio per [facility] (Dr. Rennis Golden    Review of Systems  Constitutional:  Negative for appetite change, chills and fever.  Respiratory:  Negative for chest tightness, shortness of breath and wheezing.   Cardiovascular:  Negative for chest pain and palpitations.  Gastrointestinal:  Negative for abdominal pain, nausea and vomiting.       Objective    Wt 194 lb 6.4 oz (88.2 kg)   BMI 29.56 kg/m    Physical Exam   General: Appearance:    Well developed, well nourished male in no acute distress  Eyes:    PERRL, conjunctiva/corneas clear, EOM's intact       Lungs:     Clear to auscultation bilaterally, respirations unlabored  Heart:    Normal heart rate. Normal rhythm. No murmurs, rubs, or gallops.    MS:   All extremities are intact.    Neurologic:   Awake, alert, oriented x 3. No apparent focal neurological defect.         Assessment & Plan     1. Anxiety and depression Stable on current medication regiment. Continue current medications.    2. Mixed hyperlipidemia Now being managed by Dr. Delene Loll due to statin intolerance.   3. Statin intolerance On Leqvio as above.     The entirety of the information documented in the History of Present Illness, Review of Systems and Physical Exam were personally obtained by me. Portions of this information were initially documented by the CMA and reviewed by me for thoroughness and accuracy.     Mila Merry, MD  [facility] [phone] (phone) [phone] (fax)  [facility]

## 2023-02-20 NOTE — Progress Notes (Signed)
Diagnosis: Hyperlipidemia  Provider:  Praveen Mannam MD  Procedure: Injection  Leqvio (inclisiran), Dose: 284 mg, Site: subcutaneous, Number of injections: 1  Post Care: Observation period completed  Discharge: Condition: Good, Destination: Home . AVS Provided  Performed by:  Westly Hinnant E Taesean Reth, LPN       

## 2023-02-22 ENCOUNTER — Ambulatory Visit (INDEPENDENT_AMBULATORY_CARE_PROVIDER_SITE_OTHER): Payer: Medicare Other | Admitting: Family Medicine

## 2023-02-22 VITALS — BP 127/77 | HR 77 | Wt 194.4 lb

## 2023-02-22 DIAGNOSIS — F419 Anxiety disorder, unspecified: Secondary | ICD-10-CM | POA: Diagnosis not present

## 2023-02-22 DIAGNOSIS — Z789 Other specified health status: Secondary | ICD-10-CM

## 2023-02-22 DIAGNOSIS — F32A Depression, unspecified: Secondary | ICD-10-CM

## 2023-02-22 DIAGNOSIS — E782 Mixed hyperlipidemia: Secondary | ICD-10-CM | POA: Diagnosis not present

## 2023-02-22 NOTE — Patient Instructions (Signed)
.   Please review the attached list of medications and notify my office if there are any errors.   . Please bring all of your medications to every appointment so we can make sure that our medication list is the same as yours.   

## 2023-03-13 ENCOUNTER — Other Ambulatory Visit: Payer: Self-pay | Admitting: Family Medicine

## 2023-03-20 DIAGNOSIS — L821 Other seborrheic keratosis: Secondary | ICD-10-CM | POA: Diagnosis not present

## 2023-03-20 DIAGNOSIS — L570 Actinic keratosis: Secondary | ICD-10-CM | POA: Diagnosis not present

## 2023-03-20 DIAGNOSIS — L814 Other melanin hyperpigmentation: Secondary | ICD-10-CM | POA: Diagnosis not present

## 2023-03-20 DIAGNOSIS — D225 Melanocytic nevi of trunk: Secondary | ICD-10-CM | POA: Diagnosis not present

## 2023-04-17 DIAGNOSIS — M79604 Pain in right leg: Secondary | ICD-10-CM | POA: Diagnosis not present

## 2023-04-17 DIAGNOSIS — M5416 Radiculopathy, lumbar region: Secondary | ICD-10-CM | POA: Diagnosis not present

## 2023-04-17 DIAGNOSIS — M5136 Other intervertebral disc degeneration, lumbar region: Secondary | ICD-10-CM | POA: Diagnosis not present

## 2023-04-17 DIAGNOSIS — M545 Low back pain, unspecified: Secondary | ICD-10-CM | POA: Diagnosis not present

## 2023-04-19 ENCOUNTER — Ambulatory Visit: Payer: Medicare Other | Admitting: Cardiology

## 2023-05-28 ENCOUNTER — Ambulatory Visit (INDEPENDENT_AMBULATORY_CARE_PROVIDER_SITE_OTHER): Payer: Medicare Other

## 2023-05-28 VITALS — BP 159/73 | HR 96 | Temp 97.7°F | Resp 24 | Ht 69.0 in | Wt 186.6 lb

## 2023-05-28 DIAGNOSIS — E782 Mixed hyperlipidemia: Secondary | ICD-10-CM

## 2023-05-28 MED ORDER — INCLISIRAN SODIUM 284 MG/1.5ML ~~LOC~~ SOSY
284.0000 mg | PREFILLED_SYRINGE | Freq: Once | SUBCUTANEOUS | Status: AC
  Administered 2023-05-28: 284 mg via SUBCUTANEOUS
  Filled 2023-05-28: qty 1.5

## 2023-05-28 NOTE — Progress Notes (Signed)
Diagnosis: Hyperlipidemia  Provider:  Chilton Greathouse MD  Procedure: Injection  Leqvio (inclisiran), Dose: 284 mg, Site: subcutaneous, Number of injections: 1  Administered in right arm.  Post Care: Patient declined observation  Discharge: Condition: Good, Destination: Home . AVS Provided  Performed by:  Wyvonne Lenz, RN

## 2023-05-29 ENCOUNTER — Ambulatory Visit: Payer: Medicare Other

## 2023-06-10 ENCOUNTER — Other Ambulatory Visit: Payer: Self-pay | Admitting: Family Medicine

## 2023-06-10 DIAGNOSIS — M810 Age-related osteoporosis without current pathological fracture: Secondary | ICD-10-CM

## 2023-06-18 DIAGNOSIS — E7800 Pure hypercholesterolemia, unspecified: Secondary | ICD-10-CM | POA: Diagnosis not present

## 2023-06-21 ENCOUNTER — Ambulatory Visit: Payer: Medicare Other | Admitting: Internal Medicine

## 2023-06-21 ENCOUNTER — Encounter: Payer: Self-pay | Admitting: Internal Medicine

## 2023-06-21 VITALS — BP 128/66 | HR 80 | Ht 69.0 in | Wt 184.6 lb

## 2023-06-21 DIAGNOSIS — T466X5D Adverse effect of antihyperlipidemic and antiarteriosclerotic drugs, subsequent encounter: Secondary | ICD-10-CM | POA: Diagnosis not present

## 2023-06-21 DIAGNOSIS — E7800 Pure hypercholesterolemia, unspecified: Secondary | ICD-10-CM | POA: Insufficient documentation

## 2023-06-21 DIAGNOSIS — T466X5A Adverse effect of antihyperlipidemic and antiarteriosclerotic drugs, initial encounter: Secondary | ICD-10-CM | POA: Insufficient documentation

## 2023-06-21 DIAGNOSIS — E782 Mixed hyperlipidemia: Secondary | ICD-10-CM

## 2023-06-21 DIAGNOSIS — E785 Hyperlipidemia, unspecified: Secondary | ICD-10-CM | POA: Diagnosis not present

## 2023-06-21 DIAGNOSIS — M791 Myalgia, unspecified site: Secondary | ICD-10-CM

## 2023-06-21 NOTE — Progress Notes (Signed)
LIPID CLINIC CONSULT NOTE  Chief Complaint:  Manage dyslipidemia  Primary Care Physician: Malva Limes, MD  Primary Cardiologist:  Debbe Odea, MD  HPI:  Larry Crawford is a 82 y.o. male who is being seen today for the evaluation of dyslipidemia at the request of Fisher, Demetrios Isaacs, MD. This is a pleasant 82 year old male kindly referred for evaluation management of dyslipidemia.  He reports some longstanding history of high cholesterol and has several brothers with high cholesterol.  There is no known coronary artery disease.  He has been followed at the [place] as well.  He had been referred to see Dr. Azucena Cecil for lipid management.  Subsequently he has been on statins, ezetimibe and bempedoic acid, all of which caused significant side effects.  He was then prescribed for Repatha however this was not approved by insurance for unknown reasons.  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  That would fit with the family history of high cholesterol and his brothers as well as his personal cholesterol which was elevated in Crawford 2023 to total 304, HDL 40, triglycerides 250 and LDL 214.  06/21/2023  Larry Crawford is seen today in follow-up. We were fortunately able to get him established on Leqvio.  The medication cost seems to be covered with [facility] in addition to Medicare.  He has had a substantial reduction in his cholesterol with a Leqvio.  He is recent LDL particle number is 1233, his LDL is down to 106 (from 214).  Triglycerides were 141 and HDL 35.  LP(a) was assessed and not surprisingly is elevated 127.9 nmol/L.  PMHx:  Past Medical History:  Diagnosis Date   BPH (benign prostatic hypertrophy)    Colon polyp    Elevated red blood cell count    Erectile dysfunction    Gout    History of chicken pox    History of measles    History of mumps    IBS (irritable bowel syndrome)    MRSA infection 2000   nasal   Skin cancer (melanoma) (HCC) 2021   left arm     Past Surgical History:  Procedure Laterality Date   aortic ultrasound  2013   normal. Screening ultrasound at [facility] per pt report   BCC Excised from chest  1984   BUNIONECTOMY Right    great toe   CATARACT EXTRACTION, BILATERAL     CHOLECYSTECTOMY  1997   COLONOSCOPY     INGUINAL HERNIA REPAIR  [phone]   2 on right/1 on left   IR KYPHO LUMBAR INC FX REDUCE BONE BX UNI/BIL CANNULATION INC/IMAGING  02/16/2022   IR RADIOLOGIST EVAL & MGMT  01/30/2022   IR RADIOLOGIST EVAL & MGMT  02/27/2022   KYPHOPLASTY N/A 04/03/2021   Procedure: L1 KYPHOPLASTY AND BONE BIOPDY;  Surgeon: Kennedy Bucker, MD;  Location: [facility] ORS;  Service: Orthopedics;  Laterality: N/A;   LIPOMA EXCISION  2013   NOSE SURGERY     divided septum   PARTIAL KNEE ARTHROPLASTY Right 01/10/2021   Procedure: UNICOMPARTMENTAL KNEE;  Surgeon: Christena Flake, MD;  Location: [facility] ORS;  Service: Orthopedics;  Laterality: Right;   PENILE PROSTHESIS IMPLANT  12/17/2013   POLYPECTOMY     PROSTATE SURGERY     Rezum procedure done in office   RIH  1987   X2   SHOULDER ARTHROSCOPY  1984 and 1987   left X2 for spurs   TONSILLECTOMY AND ADENOIDECTOMY  2002   UMBILICAL HERNIA  REPAIR      FAMHx:  Family History  Problem Relation Age of Onset   Hypothyroidism Mother    Diabetes Brother    Obesity Brother    Cancer Brother        throat    SOCHx:   reports that he quit smoking about 64 years ago. His smoking use included cigarettes. He started smoking about 69 years ago. He has a 3.8 pack-year smoking history. He has never used smokeless tobacco. He reports current alcohol use. He reports that he does not use drugs.  ALLERGIES:  Allergies  Allergen Reactions   Tetracyclines & Related Shortness Of Breath and Rash   Bempedoic Acid Other (See Comments)    headache   Colestipol Hcl Diarrhea    Fatigue   Corn-Containing Products     Diarrhea and "makes my insides hurt"    Pravastatin Sodium     Muscle Pain   Shellfish  Allergy     Causes gout    Sulfa Antibiotics Hives   Welchol [Colesevelam]     Stomach pain   Zetia [Ezetimibe]     Didn't feel good when he was taking it.     ROS: Pertinent items noted in HPI and remainder of comprehensive ROS otherwise negative.  HOME MEDS: Current Outpatient Medications on File Prior to Visit  Medication Sig Dispense Refill   alendronate (FOSAMAX) 70 MG tablet TAKE 1 TABLET(70 MG) BY MOUTH 1 TIME A WEEK WITH A FULL GLASS OF WATER AND ON AN EMPTY STOMACH 12 tablet 4   allopurinol (ZYLOPRIM) 300 MG tablet Take 0.5 tablets (150 mg total) by mouth in the morning. 90 tablet 1   cetirizine (ZYRTEC) 10 MG tablet Take 10 mg by mouth in the morning.     Coenzyme Q10 (COQ10) 100 MG CAPS Take 100 mg by mouth in the morning.     Cyanocobalamin (VITAMIN B-12) 5000 MCG SUBL Place 5,000 mcg under the tongue in the morning.     DERMOTIC 0.01 % OIL Apply 1 application  topically See admin instructions. 1 application in the ears once or twice weekly     Elderberry 500 MG CAPS Take by mouth.     famotidine (PEPCID) 20 MG tablet Take 20 mg by mouth daily as needed for heartburn or indigestion.     FLUoxetine HCl (PROZAC PO) Take by mouth daily.     fluticasone (FLONASE) 50 MCG/ACT nasal spray Place 1 spray into both nostrils daily as needed for allergies or rhinitis.     L-Arginine 1000 MG TABS Take 1,000 mg by mouth 3 (three) times a week.     Multiple Vitamin (MULTIVITAMIN WITH MINERALS) TABS tablet Take 1 tablet by mouth in the morning.     Multiple Vitamins-Minerals (PRESERVISION AREDS 2 PO) Take 1 tablet by mouth in the morning.     niacin 500 MG tablet Take 500 mg by mouth in the morning.     tadalafil (CIALIS) 5 MG tablet Take 1 tablet (5 mg total) by mouth every morning. 90 tablet 3   Blood Glucose Monitoring Suppl (TRUE METRIX METER) w/Device KIT See admin instructions.     Blood Glucose Monitoring Suppl DEVI 1 each by Does not apply route in the morning, at noon, and at  bedtime. May substitute to any manufacturer covered by patient's insurance. (Patient not taking: Reported on 06/21/2023) 1 each 0   HYDROcodone-acetaminophen (NORCO) 5-325 MG tablet Take 1 tablet by mouth every 6 (six) hours as needed for moderate  pain. (Patient not taking: Reported on 06/21/2023) 15 tablet 0   sertraline (ZOLOFT) 50 MG tablet Take 1 tablet (50 mg total) by mouth daily. (Patient not taking: Reported on 06/21/2023) 90 tablet 2   TRUE METRIX BLOOD GLUCOSE TEST test strip TEST THREE TIMES DAILY(MORNING, NOON AND AT BEDTIME) 100 strip 0   No current facility-administered medications on file prior to visit.    LABS/IMAGING: No results found for this or any previous visit (from the past 48 hour(s)). No results found.  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   Larry Crawford has had a very good response to [facility] with a reduction in LDL cholesterol from 214 down to 106.  His LP(a) is not surprisingly elevated at 127.9 nmol/L, probably it was 20 to 30% higher than this prior to starting therapy.  Unfortunately has no other therapeutic options to lower his  cholesterol further due to intolerances however this is a very good response.  I would recommend we continue on Leqvio every 6 months indefinitely.  Plan follow-up with me annually or sooner as necessary.  Chrystie Nose, MD, [facility], FACP  [place]  [facility]  Medical Director of the Advanced Lipid Disorders &  Cardiovascular Risk Reduction Clinic Diplomate of the American Board of Clinical Lipidology Attending Cardiologist  Direct Dial: [phone]  Fax: [phone]  Website:  [email or web address]  Lisette Abu Vivienne Sangiovanni 06/21/2023, 2:15 PM

## 2023-06-21 NOTE — Patient Instructions (Signed)
Medication Instructions:  NO CHANGES  *If you need a refill on your cardiac medications before your next appointment, please call your pharmacy*   Lab Work: FASTING lab work in 1 year -- complete before your next appointment   If you have labs (blood work) drawn today and your tests are completely normal, you will receive your results only by: MyChart Message (if you have MyChart) OR A paper copy in the mail If you have any lab test that is abnormal or we need to change your treatment, we will call you to review the results.   Follow-Up: At [facility], you and your health needs are our priority.  As part of our continuing mission to provide you with exceptional heart care, we have created designated Provider Care Teams.  These Care Teams include your primary Cardiologist (physician) and Advanced Practice Providers (APPs -  Physician Assistants and Nurse Practitioners) who all work together to provide you with the care you need, when you need it.  We recommend signing up for the patient portal called "MyChart".  Sign up information is provided on this After Visit Summary.  MyChart is used to connect with patients for Virtual Visits (Telemedicine).  Patients are able to view lab/test results, encounter notes, upcoming appointments, etc.  Non-urgent messages can be sent to your provider as well.   To learn more about what you can do with MyChart, go to [email or web address].    Your next appointment:    12 months with Dr. Rennis Golden

## 2023-06-26 DIAGNOSIS — H353131 Nonexudative age-related macular degeneration, bilateral, early dry stage: Secondary | ICD-10-CM | POA: Diagnosis not present

## 2023-06-28 DIAGNOSIS — M47816 Spondylosis without myelopathy or radiculopathy, lumbar region: Secondary | ICD-10-CM | POA: Diagnosis not present

## 2023-07-24 ENCOUNTER — Ambulatory Visit: Payer: Medicare Other | Admitting: Family Medicine

## 2023-07-30 ENCOUNTER — Ambulatory Visit (INDEPENDENT_AMBULATORY_CARE_PROVIDER_SITE_OTHER): Payer: Medicare Other | Admitting: Family Medicine

## 2023-07-30 ENCOUNTER — Encounter: Payer: Self-pay | Admitting: Family Medicine

## 2023-07-30 VITALS — BP 128/58 | HR 63 | Ht 69.0 in | Wt 187.0 lb

## 2023-07-30 DIAGNOSIS — R5383 Other fatigue: Secondary | ICD-10-CM

## 2023-07-30 DIAGNOSIS — M810 Age-related osteoporosis without current pathological fracture: Secondary | ICD-10-CM

## 2023-07-30 DIAGNOSIS — Z23 Encounter for immunization: Secondary | ICD-10-CM

## 2023-07-30 DIAGNOSIS — E559 Vitamin D deficiency, unspecified: Secondary | ICD-10-CM

## 2023-07-30 MED ORDER — ALENDRONATE SODIUM 70 MG PO TABS
70.0000 mg | ORAL_TABLET | ORAL | 4 refills | Status: DC
Start: 2023-07-30 — End: 2023-12-30

## 2023-07-31 LAB — CBC
Hematocrit: 38 % (ref 37.5–51.0)
Hemoglobin: 12.4 g/dL — ABNORMAL LOW (ref 13.0–17.7)
MCH: 31.2 pg (ref 26.6–33.0)
MCHC: 32.6 g/dL (ref 31.5–35.7)
MCV: 96 fL (ref 79–97)
Platelets: 431 10*3/uL (ref 150–450)
RBC: 3.97 x10E6/uL — ABNORMAL LOW (ref 4.14–5.80)
RDW: 14.3 % (ref 11.6–15.4)
WBC: 8.2 10*3/uL (ref 3.4–10.8)

## 2023-07-31 LAB — COMPREHENSIVE METABOLIC PANEL
ALT: 15 IU/L (ref 0–44)
AST: 19 IU/L (ref 0–40)
Albumin: 4.4 g/dL (ref 3.7–4.7)
Alkaline Phosphatase: 106 IU/L (ref 44–121)
BUN/Creatinine Ratio: 15 (ref 10–24)
BUN: 20 mg/dL (ref 8–27)
Bilirubin Total: 0.3 mg/dL (ref 0.0–1.2)
CO2: 22 mmol/L (ref 20–29)
Calcium: 9.8 mg/dL (ref 8.6–10.2)
Chloride: 96 mmol/L (ref 96–106)
Creatinine, Ser: 1.37 mg/dL — ABNORMAL HIGH (ref 0.76–1.27)
Globulin, Total: 1.9 g/dL (ref 1.5–4.5)
Glucose: 107 mg/dL — ABNORMAL HIGH (ref 70–99)
Potassium: 4.9 mmol/L (ref 3.5–5.2)
Sodium: 132 mmol/L — ABNORMAL LOW (ref 134–144)
Total Protein: 6.3 g/dL (ref 6.0–8.5)
eGFR: 51 mL/min/{1.73_m2} — ABNORMAL LOW (ref >59)

## 2023-07-31 LAB — TSH: TSH: 3.23 u[IU]/mL (ref 0.450–4.500)

## 2023-07-31 LAB — VITAMIN B12: Vitamin B-12: 1919 pg/mL — ABNORMAL HIGH (ref 232–1245)

## 2023-07-31 LAB — T4, FREE: Free T4: 1.19 ng/dL (ref 0.82–1.77)

## 2023-07-31 LAB — VITAMIN D 25 HYDROXY (VIT D DEFICIENCY, FRACTURES): Vit D, 25-Hydroxy: 40.1 ng/mL (ref 30.0–100.0)

## 2023-07-31 NOTE — Progress Notes (Signed)
Established patient visit   Patient: Larry Crawford   DOB: 14-Mar-1941   82 y.o. Male  MRN: [id number] Visit Date: 07/30/2023  Today's healthcare provider: Mila Merry, MD   Chief Complaint  Patient presents with   Medical Management of Chronic Issues    Follow up on anxiety and depression, experiencing low back pain, pain has been going on for several yrs, prior treatment has been burning of the nerves, last treatment was ineffective    Subjective    Discussed the use of AI scribe software for clinical note transcription with the patient, who gave verbal consent to proceed.  History of Present Illness   The patient, with a history of hyperlipidemia and gout, presents with complaints of chronic fatigue and cold intolerance. He reports feeling unwell and has been experiencing these symptoms for the past few years, with a perceived worsening recently. The patient also reports chronic back pain, which has been treated with radiofrequency ablation at the [facility], but the most recent treatment a few weeks ago has not provided relief. He has not been prescribed any pain medication for this issue.  The patient also reports occasional difficulty in initiating sleep and frequent nocturnal awakenings. I had previously prescribed sertraline, but is now on fluoxetine, which appears to have been prescribed along with bupropion at the [place].  The patient has been on allopurinol for gout, but recently had to increase the dose to 300mg  daily due to a recurrence of symptoms. He also reports recent sinus surgery and has been instructed to perform nasal washouts twice daily, which has led to some nasal bleeding.  The patient was previously on Fosamax, but the prescription has run out and he is unsure if he needs to continue it. He has also been treated with prednisone or cortisone injections on an intermittent basis.       Medications: Outpatient Medications Prior to Visit  Medication Sig    cetirizine (ZYRTEC) 10 MG tablet Take 10 mg by mouth in the morning.   Coenzyme Q10 (COQ10) 100 MG CAPS Take 100 mg by mouth in the morning.   Cyanocobalamin (VITAMIN B-12) 5000 MCG SUBL Place 5,000 mcg under the tongue in the morning.   DERMOTIC 0.01 % OIL Apply 1 application  topically See admin instructions. 1 application in the ears once or twice weekly   Elderberry 500 MG CAPS Take by mouth.   famotidine (PEPCID) 20 MG tablet Take 20 mg by mouth daily as needed for heartburn or indigestion.   FLUoxetine HCl (PROZAC PO) Take by mouth daily.   fluticasone (FLONASE) 50 MCG/ACT nasal spray Place 1 spray into both nostrils daily as needed for allergies or rhinitis.   HYDROcodone-acetaminophen (NORCO) 5-325 MG tablet Take 1 tablet by mouth every 6 (six) hours as needed for moderate pain.   L-Arginine 1000 MG TABS Take 1,000 mg by mouth 3 (three) times a week.   Multiple Vitamin (MULTIVITAMIN WITH MINERALS) TABS tablet Take 1 tablet by mouth in the morning.   Multiple Vitamins-Minerals (PRESERVISION AREDS 2 PO) Take 1 tablet by mouth in the morning.   niacin 500 MG tablet Take 500 mg by mouth in the morning.   tadalafil (CIALIS) 5 MG tablet Take 1 tablet (5 mg total) by mouth every morning.   allopurinol (ZYLOPRIM) 300 MG tablet Take 0.5 tablets (150 mg total) by mouth in the morning.   alendronate (FOSAMAX) 70 MG tablet TAKE 1 TABLET(70 MG) BY MOUTH 1 TIME A WEEK  WITH A FULL GLASS OF WATER AND ON AN EMPTY STOMACH    sertraline (ZOLOFT) 50 MG tablet Take 1 tablet (50 mg total) by mouth daily. (Patient not taking: Reported on 06/21/2023)   No facility-administered medications prior to visit.     Objective    BP (!) 128/58 (BP Location: Left Arm, Patient Position: Sitting, Cuff Size: Large)   Pulse 63   Ht 5\' 9"  (1.753 m)   Wt 187 lb (84.8 kg)   SpO2 99%   BMI 27.62 kg/m    Physical Exam   CHEST: Lungs clear to auscultation. CARDIOVASCULAR: Heart sounds normal on auscultation.        Assessment & Plan        Chronic Back Pain Persistent despite recent ablation therapy. No current pain medication regimen. -Order labs to assess metabolic and hematologic status. -Consider starting Duloxetine for pain management pending lab results.  Hyperlipidemia Managed by Dr. Rennis Golden, recent improvement noted. -Continue current management.  Gout Increased Allopurinol dose to 300mg  daily due to recurrence of symptoms. -Continue Allopurinol 300mg  daily.  Depression Currently on Fluoxetine recently prescribed from the [place] -Continue Fluoxetine.  Osteoporosis Fosamax prescription expired, not currently on medication list. -Refill Fosamax prescription.  General Health Maintenance -Order labs to assess thyroid, glucose, blood cell count, and kidney function.         Mila Merry, MD  [facility] [phone] (phone) [phone] (fax)  [facility]

## 2023-08-13 DIAGNOSIS — M62830 Muscle spasm of back: Secondary | ICD-10-CM | POA: Diagnosis not present

## 2023-08-13 DIAGNOSIS — M5380 Other specified dorsopathies, site unspecified: Secondary | ICD-10-CM | POA: Diagnosis not present

## 2023-08-13 DIAGNOSIS — M5126 Other intervertebral disc displacement, lumbar region: Secondary | ICD-10-CM | POA: Diagnosis not present

## 2023-08-13 DIAGNOSIS — M5416 Radiculopathy, lumbar region: Secondary | ICD-10-CM | POA: Diagnosis not present

## 2023-08-13 DIAGNOSIS — M48061 Spinal stenosis, lumbar region without neurogenic claudication: Secondary | ICD-10-CM | POA: Diagnosis not present

## 2023-08-13 DIAGNOSIS — M47816 Spondylosis without myelopathy or radiculopathy, lumbar region: Secondary | ICD-10-CM | POA: Diagnosis not present

## 2023-08-13 DIAGNOSIS — M5136 Other intervertebral disc degeneration, lumbar region: Secondary | ICD-10-CM | POA: Diagnosis not present

## 2023-09-26 ENCOUNTER — Other Ambulatory Visit: Payer: Self-pay | Admitting: Family Medicine

## 2023-09-26 DIAGNOSIS — N400 Enlarged prostate without lower urinary tract symptoms: Secondary | ICD-10-CM

## 2023-09-26 DIAGNOSIS — N529 Male erectile dysfunction, unspecified: Secondary | ICD-10-CM

## 2023-09-26 NOTE — Telephone Encounter (Signed)
Requested Prescriptions  Pending Prescriptions Disp Refills   tadalafil (CIALIS) 5 MG tablet [Pharmacy Med Name: TADALAFIL (CL) TABS 5MG ] 90 tablet 1    Sig: TAKE 1 TABLET EVERY MORNING     Urology: Erectile Dysfunction Agents Passed - 09/26/2023  2:34 AM      Passed - AST in normal range and within 360 days    AST  Date Value Ref Range Status  07/30/2023 19 0 - 40 IU/L Final         Passed - ALT in normal range and within 360 days    ALT  Date Value Ref Range Status  07/30/2023 15 0 - 44 IU/L Final         Passed - Last BP in normal range    BP Readings from Last 1 Encounters:  07/30/23 (!) 128/58         Passed - Valid encounter within last 12 months    Recent Outpatient Visits           1 month ago Flu vaccine need   [facility] Malva Limes, MD   7 months ago Anxiety and depression   [place] [facility] Malva Limes, MD   8 months ago Anxiety and depression   [facility] Malva Limes, MD   10 months ago Anxiety and depression   [place] [facility] Alfredia Ferguson, PA-C   1 year ago Hyperglycemia   [facility] Sherrie Mustache, Demetrios Isaacs, MD

## 2023-10-01 DIAGNOSIS — D225 Melanocytic nevi of trunk: Secondary | ICD-10-CM | POA: Diagnosis not present

## 2023-10-01 DIAGNOSIS — L814 Other melanin hyperpigmentation: Secondary | ICD-10-CM | POA: Diagnosis not present

## 2023-10-01 DIAGNOSIS — L821 Other seborrheic keratosis: Secondary | ICD-10-CM | POA: Diagnosis not present

## 2023-10-01 DIAGNOSIS — D492 Neoplasm of unspecified behavior of bone, soft tissue, and skin: Secondary | ICD-10-CM | POA: Diagnosis not present

## 2023-10-01 DIAGNOSIS — Z85820 Personal history of malignant melanoma of skin: Secondary | ICD-10-CM | POA: Diagnosis not present

## 2023-10-01 DIAGNOSIS — D0462 Carcinoma in situ of skin of left upper limb, including shoulder: Secondary | ICD-10-CM | POA: Diagnosis not present

## 2023-10-01 DIAGNOSIS — Z08 Encounter for follow-up examination after completed treatment for malignant neoplasm: Secondary | ICD-10-CM | POA: Diagnosis not present

## 2023-10-01 DIAGNOSIS — L570 Actinic keratosis: Secondary | ICD-10-CM | POA: Diagnosis not present

## 2023-10-29 ENCOUNTER — Ambulatory Visit: Payer: Medicare Other

## 2023-10-29 VITALS — BP 134/70 | Ht 69.0 in | Wt 188.3 lb

## 2023-10-29 DIAGNOSIS — Z0000 Encounter for general adult medical examination without abnormal findings: Secondary | ICD-10-CM

## 2023-10-29 NOTE — Progress Notes (Signed)
Subjective:   ANTARIO DELFINO is a 82 y.o. male who presents for Medicare Annual/Subsequent preventive examination.  Visit Complete: In person  Cardiac Risk Factors include: advanced age (>45men, >73 women);male gender;dyslipidemia;sedentary lifestyle     Objective:    Today's Vitals   10/29/23 0841 10/29/23 0846  BP: 134/70   Weight: 188 lb 4.8 oz (85.4 kg)   Height: 5\' 9"  (1.753 m)   PainSc:  4    Body mass index is 27.81 kg/m.     10/29/2023    8:52 AM 12/24/2022    1:30 PM 10/22/2022    9:07 AM 02/16/2022    8:23 AM 01/10/2021    9:25 AM 01/03/2021   10:33 AM 09/06/2020    8:40 AM  Advanced Directives  Does Patient Have a Medical Advance Directive? No Yes No Yes No Yes Yes  Type of Furniture conservator/restorer;Living will  Living will;Healthcare Power of [employer] Power of [place];Living will Healthcare Power of [place];Living will  Does patient want to make changes to medical advance directive?     No - Patient declined No - Patient declined   Copy of Healthcare Power of Attorney in Chart?    No - copy requested  No - copy requested No - copy requested  Would patient like information on creating a medical advance directive? No - Patient declined  No - Patient declined  No - Patient declined      Current Medications (verified) Outpatient Encounter Medications as of 10/29/2023  Medication Sig   alendronate (FOSAMAX) 70 MG tablet Take 1 tablet (70 mg total) by mouth once a week. Take with a full glass of water on an empty stomach.   cetirizine (ZYRTEC) 10 MG tablet Take 10 mg by mouth in the morning.   Coenzyme Q10 (COQ10) 100 MG CAPS Take 100 mg by mouth in the morning.   Cyanocobalamin (VITAMIN B-12) 5000 MCG SUBL Place 5,000 mcg under the tongue in the morning.   DERMOTIC 0.01 % OIL Apply 1 application  topically See admin instructions. 1 application in the ears once or twice weekly   Elderberry 500 MG CAPS Take by mouth.   famotidine  (PEPCID) 20 MG tablet Take 20 mg by mouth daily as needed for heartburn or indigestion.   FLUoxetine HCl (PROZAC PO) Take by mouth daily.   fluticasone (FLONASE) 50 MCG/ACT nasal spray Place 1 spray into both nostrils daily as needed for allergies or rhinitis.   HYDROcodone-acetaminophen (NORCO) 5-325 MG tablet Take 1 tablet by mouth every 6 (six) hours as needed for moderate pain.   L-Arginine 1000 MG TABS Take 1,000 mg by mouth 3 (three) times a week.   Multiple Vitamin (MULTIVITAMIN WITH MINERALS) TABS tablet Take 1 tablet by mouth in the morning.   Multiple Vitamins-Minerals (PRESERVISION AREDS 2 PO) Take 1 tablet by mouth in the morning.   niacin 500 MG tablet Take 500 mg by mouth in the morning.   tadalafil (CIALIS) 5 MG tablet TAKE 1 TABLET EVERY MORNING   alfuzosin (UROXATRAL) 10 MG 24 hr tablet Take 10 mg by mouth daily. (Patient not taking: Reported on 10/29/2023)   allopurinol (ZYLOPRIM) 300 MG tablet Take 0.5 tablets (150 mg total) by mouth in the morning.   buPROPion (WELLBUTRIN XL) 150 MG 24 hr tablet Take 150 mg by mouth daily.   No facility-administered encounter medications on file as of 10/29/2023.    Allergies (verified) Sulfa antibiotics, Tetracycline, Tetracyclines & related,  Bempedoic acid, Colestipol hcl, Corn-containing products, Pravastatin sodium, Shellfish allergy, Sulfamethoxazole-trimethoprim, Welchol [colesevelam], and Zetia [ezetimibe]   History: Past Medical History:  Diagnosis Date   BPH (benign prostatic hypertrophy)    Colon polyp    Elevated red blood cell count    Erectile dysfunction    Gout    History of chicken pox    History of measles    History of mumps    IBS (irritable bowel syndrome)    MRSA infection 2000   nasal   Skin cancer (melanoma) (HCC) 2021   left arm   Past Surgical History:  Procedure Laterality Date   aortic ultrasound  2013   normal. Screening ultrasound at [facility] per pt report   BCC Excised from chest  1984    BUNIONECTOMY Right    great toe   CATARACT EXTRACTION, BILATERAL     CHOLECYSTECTOMY  1997   COLONOSCOPY     INGUINAL HERNIA REPAIR  [phone]   2 on right/1 on left   IR KYPHO LUMBAR INC FX REDUCE BONE BX UNI/BIL CANNULATION INC/IMAGING  02/16/2022   IR RADIOLOGIST EVAL & MGMT  01/30/2022   IR RADIOLOGIST EVAL & MGMT  02/27/2022   KYPHOPLASTY N/A 04/03/2021   Procedure: L1 KYPHOPLASTY AND BONE BIOPDY;  Surgeon: Kennedy Bucker, MD;  Location: [facility] ORS;  Service: Orthopedics;  Laterality: N/A;   LIPOMA EXCISION  2013   NOSE SURGERY     divided septum   PARTIAL KNEE ARTHROPLASTY Right 01/10/2021   Procedure: UNICOMPARTMENTAL KNEE;  Surgeon: Christena Flake, MD;  Location: [facility] ORS;  Service: Orthopedics;  Laterality: Right;   PENILE PROSTHESIS IMPLANT  12/17/2013   POLYPECTOMY     PROSTATE SURGERY     Rezum procedure done in office   RIH  1987   X2   SHOULDER ARTHROSCOPY  1984 and 1987   left X2 for spurs   TONSILLECTOMY AND ADENOIDECTOMY  2002   UMBILICAL HERNIA REPAIR     Family History  Problem Relation Age of Onset   Hypothyroidism Mother    Diabetes Brother    Obesity Brother    Cancer Brother        throat   Social History   Socioeconomic History   Marital status: Married    Spouse name: Velna Hatchet   Number of children: 4   Years of education: Not on file   Highest education level: Associate degree: occupational, Scientist, product/process development, or vocational program  Occupational History   Occupation: retired  Tobacco Use   Smoking status: Former    Current packs/day: 0.00    Average packs/day: 0.8 packs/day for 5.0 years (3.8 ttl pk-yrs)    Types: Cigarettes    Start date: 11/19/1953    Quit date: 11/19/1958    Years since quitting: 64.9   Smokeless tobacco: Never  Vaping Use   Vaping status: Never Used  Substance and Sexual Activity   Alcohol use: Yes    Alcohol/week: 0.0 standard drinks of alcohol    Comment: 2 glasses of wine a month   Drug use: No   Sexual activity: Not on file   Other Topics Concern   Not on file  Social History Narrative   Pt went to [employer] school through the [employer].    Social Determinants of Health   Financial Resource Strain: Low Risk  (10/29/2023)   Overall Financial Resource Strain (CARDIA)    Difficulty of Paying Living Expenses: Not hard at all  Food Insecurity: No Food Insecurity (10/29/2023)  Hunger Vital Sign    Worried About Running Out of Food in the Last Year: Never true    Ran Out of Food in the Last Year: Never true  Transportation Needs: No Transportation Needs (10/29/2023)   PRAPARE - Administrator, Civil Service (Medical): No    Lack of Transportation (Non-Medical): No  Physical Activity: Insufficiently Active (10/29/2023)   Exercise Vital Sign    Days of Exercise per Week: 2 days    Minutes of Exercise per Session: 20 min  Stress: No Stress Concern Present (10/29/2023)   [employer] of Occupational Health - Occupational Stress Questionnaire    Feeling of Stress : Only a little  Social Connections: Moderately Isolated (10/29/2023)   Social Connection and Isolation Panel [NHANES]    Frequency of Communication with Friends and Family: Twice a week    Frequency of Social Gatherings with Friends and Family: Never    Attends Religious Services: Never    Database administrator or Organizations: Yes    Attends Engineer, structural: More than 4 times per year    Marital Status: Married    Tobacco Counseling Counseling given: Not Answered   Clinical Intake:  Pre-visit preparation completed: Yes  Pain : 0-10 Pain Score: 4  Pain Type: Chronic pain Pain Location: Back Pain Orientation: Lower Pain Descriptors / Indicators: Aching Pain Onset: More than a month ago Pain Frequency: Constant Pain Relieving Factors: OXYCODONE  Pain Relieving Factors: OXYCODONE  BMI - recorded: 27.81 Nutritional Status: BMI 25 -29 Overweight Nutritional Risks: None Diabetes: No  How often do you need to  have someone help you when you read instructions, pamphlets, or other written materials from your doctor or pharmacy?: 1 - Never  Interpreter Needed?: No  Information entered by :: Kennedy Bucker, LPN   Activities of Daily Living    10/29/2023    8:53 AM 10/25/2023    8:57 AM  In your present state of health, do you have any difficulty performing the following activities:  Hearing? 1 1  Vision? 0 0  Difficulty concentrating or making decisions? 0 0  Walking or climbing stairs? 0 0  Dressing or bathing? 0 0  Doing errands, shopping? 0 0  Preparing Food and eating ? N N  Using the Toilet? N N  In the past six months, have you accidently leaked urine? N Y  Do you have problems with loss of bowel control? N N  Managing your Medications? N N  Managing your Finances? N N  Housekeeping or managing your Housekeeping? N N    Patient Care Team: Malva Limes, MD as PCP - General (Family Medicine) Debbe Odea, MD as PCP - Cardiology (Cardiology) Irene Limbo., MD as Consulting Physician (Ophthalmology) Crista Elliot, MD as Consulting Physician (Urology) Poggi, Excell Seltzer, MD as Consulting Physician (Orthopedic Surgery) Merri Ray, MD as Referring Physician (Physical Medicine and Rehabilitation) Rennis Golden Lisette Abu, MD as Consulting Physician (Cardiology)  Indicate any recent Medical Services you may have received from other than [facility] providers in the past year (date may be approximate).     Assessment:   This is a routine wellness examination for Jovanny.  Hearing/Vision screen Hearing Screening - Comments:: WEARS AIDS, BOTH EARS Vision Screening - Comments:: WEARS GLASSES- DR.BELL   Goals Addressed             This Visit's Progress    DIET - INCREASE WATER INTAKE  Depression Screen    10/29/2023    8:50 AM 07/30/2023    8:58 AM 02/22/2023    9:43 AM 11/21/2022    2:44 PM 10/22/2022    9:04 AM 09/21/2022    9:32 AM 04/09/2022    8:35 AM  PHQ 2/9  Scores  PHQ - 2 Score 0 4 4 4 1 4 3   PHQ- 9 Score 0 12 14 16 4 13 12     Fall Risk    10/29/2023    8:53 AM 10/25/2023    8:57 AM 02/22/2023    9:43 AM 11/21/2022    2:43 PM 10/22/2022    9:09 AM  Fall Risk   Falls in the past year? 0 0 0 1 1  Number falls in past yr: 0 0 0 0 1  Injury with Fall? 0  0 1 1  Risk for fall due to : No Fall Risks  No Fall Risks History of fall(s) History of fall(s)  Follow up Falls prevention discussed;Falls evaluation completed  Falls evaluation completed Falls evaluation completed Falls evaluation completed;Falls prevention discussed    MEDICARE RISK AT HOME: Medicare Risk at Home Any stairs in or around the home?: Yes If so, are there any without handrails?: Yes Home free of loose throw rugs in walkways, pet beds, electrical cords, etc?: Yes Adequate lighting in your home to reduce risk of falls?: Yes Life alert?: No Use of a cane, walker or w/c?: No Grab bars in the bathroom?: No Shower chair or bench in shower?: No Elevated toilet seat or a handicapped toilet?: Yes  TIMED UP AND GO:  Was the test performed?  Yes  Length of time to ambulate 10 feet: 4 sec Gait steady and fast without use of assistive device    Cognitive Function:        10/29/2023    8:54 AM 11/20/2016    3:14 PM  6CIT Screen  What Year? 0 points 0 points  What month? 0 points 0 points  What time? 0 points 0 points  Count back from 20 0 points 0 points  Months in reverse 0 points 0 points  Repeat phrase 0 points 2 points  Total Score 0 points 2 points    Immunizations Immunization History  Administered Date(s) Administered   Fluad Quad(high Dose 65+) 08/05/2019, 09/01/2021   Fluad Trivalent(High Dose 65+) 07/30/2023   Influenza, High Dose Seasonal PF 09/28/2015, 07/27/2017, 08/14/2018, 08/18/2020, 08/07/2022   Influenza-Unspecified 09/01/2021   Moderna Covid-19 Fall Seasonal Vaccine 83yrs & older 09/04/2023   Moderna Covid-19 Vaccine Bivalent Booster 12yrs & up  07/31/2021   PFIZER(Purple Top)SARS-COV-2 Vaccination 11/23/2019, 12/14/2019, 08/22/2020   Pneumococcal Conjugate-13 05/11/2014   Pneumococcal Polysaccharide-23 07/29/2005   Rabies, IM 12/27/2017, 12/30/2017, 01/03/2018, 01/10/2018   Rsv, Bivalent, Protein Subunit Rsvpref,pf Verdis Frederickson) 11/06/2022   Tdap 08/25/2014, 12/27/2017   Zoster Recombinant(Shingrix) 02/09/2022, 10/31/2022   Zoster, Live 02/05/2013    TDAP status: Up to date  Flu Vaccine status: Up to date  Pneumococcal vaccine status: Declined,  Education has been provided regarding the importance of this vaccine but patient still declined. Advised may receive this vaccine at local pharmacy or Health Dept. Aware to provide a copy of the vaccination record if obtained from local pharmacy or Health Dept. Verbalized acceptance and understanding.   Covid-19 vaccine status: Completed vaccines  Qualifies for Shingles Vaccine? Yes   Zostavax completed Yes   Shingrix Completed?: Yes  Screening Tests Health Maintenance  Topic Date Due   COVID-19 Vaccine (  6 - 2023-24 season) 01/05/2024   DEXA SCAN  06/06/2024   Medicare Annual Wellness (AWV)  10/28/2024   DTaP/Tdap/Td (3 - Td or Tdap) 12/28/2027   Pneumonia Vaccine 31+ Years old  Completed   INFLUENZA VACCINE  Completed   Zoster Vaccines- Shingrix  Completed   HPV VACCINES  Aged Out   Colonoscopy  Discontinued    Health Maintenance  There are no preventive care reminders to display for this patient.   Colorectal cancer screening: No longer required.   Lung Cancer Screening: (Low Dose CT Chest recommended if Age 76-80 years, 20 pack-year currently smoking OR have quit w/in 15years.) does not qualify.    Additional Screening:  Hepatitis C Screening: does not qualify; Completed NO  Vision Screening: Recommended annual ophthalmology exams for early detection of glaucoma and other disorders of the eye. Is the patient up to date with their annual eye exam?  Yes  Who is the  provider or what is the name of the office in which the patient attends annual eye exams? DR.BELL If pt is not established with a provider, would they like to be referred to a provider to establish care? No .   Dental Screening: Recommended annual dental exams for proper oral hygiene   Community Resource Referral / Chronic Care Management: CRR required this visit?  No   CCM required this visit?  No     Plan:     I have personally reviewed and noted the following in the patient's chart:   Medical and social history Use of alcohol, tobacco or illicit drugs  Current medications and supplements including opioid prescriptions. Patient is currently taking opioid prescriptions. Information provided to patient regarding non-opioid alternatives. Patient advised to discuss non-opioid treatment plan with their provider. Functional ability and status Nutritional status Physical activity Advanced directives List of other physicians Hospitalizations, surgeries, and ER visits in previous 12 months Vitals Screenings to include cognitive, depression, and falls Referrals and appointments  In addition, I have reviewed and discussed with patient certain preventive protocols, quality metrics, and best practice recommendations. A written personalized care plan for preventive services as well as general preventive health recommendations were provided to patient.     Hal Hope, LPN   [id number]   After Visit Summary: (In Person-Declined) Patient declined AVS at this time.  Nurse Notes: NONE

## 2023-10-29 NOTE — Patient Instructions (Addendum)
Larry Crawford , Thank you for taking time to come for your Medicare Wellness Visit. I appreciate your ongoing commitment to your health goals. Please review the following plan we discussed and let me know if I can assist you in the future.   Referrals/Orders/Follow-Ups/Clinician Recommendations: NONE  This is a list of the screening recommended for you and due dates:  Health Maintenance  Topic Date Due   COVID-19 Vaccine (6 - 2023-24 season) 01/05/2024   DEXA scan (bone density measurement)  06/06/2024   Medicare Annual Wellness Visit  10/28/2024   DTaP/Tdap/Td vaccine (3 - Td or Tdap) 12/28/2027   Pneumonia Vaccine  Completed   Flu Shot  Completed   Zoster (Shingles) Vaccine  Completed   HPV Vaccine  Aged Out   Colon Cancer Screening  Discontinued    Advanced directives: (ACP Link)Information on Advanced Care Planning can be found at [facility] Advance Health Care Directives Advance Health Care Directives ([email or web address]   Next Medicare Annual Wellness Visit scheduled for next year: Yes   11/03/24 @ 8:50 AM IN PERSON

## 2023-11-18 DIAGNOSIS — M48061 Spinal stenosis, lumbar region without neurogenic claudication: Secondary | ICD-10-CM | POA: Diagnosis not present

## 2023-11-18 DIAGNOSIS — M47816 Spondylosis without myelopathy or radiculopathy, lumbar region: Secondary | ICD-10-CM | POA: Diagnosis not present

## 2023-11-18 DIAGNOSIS — M5126 Other intervertebral disc displacement, lumbar region: Secondary | ICD-10-CM | POA: Diagnosis not present

## 2023-11-18 DIAGNOSIS — M5416 Radiculopathy, lumbar region: Secondary | ICD-10-CM | POA: Diagnosis not present

## 2023-11-18 DIAGNOSIS — M5380 Other specified dorsopathies, site unspecified: Secondary | ICD-10-CM | POA: Diagnosis not present

## 2023-11-18 DIAGNOSIS — M62830 Muscle spasm of back: Secondary | ICD-10-CM | POA: Diagnosis not present

## 2023-11-22 ENCOUNTER — Encounter: Payer: Self-pay | Admitting: Internal Medicine

## 2023-11-22 DIAGNOSIS — D0462 Carcinoma in situ of skin of left upper limb, including shoulder: Secondary | ICD-10-CM | POA: Diagnosis not present

## 2023-11-22 DIAGNOSIS — L570 Actinic keratosis: Secondary | ICD-10-CM | POA: Diagnosis not present

## 2023-11-29 ENCOUNTER — Ambulatory Visit (INDEPENDENT_AMBULATORY_CARE_PROVIDER_SITE_OTHER): Payer: Medicare Other

## 2023-11-29 VITALS — BP 131/67 | HR 66 | Temp 97.7°F | Resp 18 | Ht 68.0 in | Wt 196.4 lb

## 2023-11-29 DIAGNOSIS — E782 Mixed hyperlipidemia: Secondary | ICD-10-CM | POA: Diagnosis not present

## 2023-11-29 MED ORDER — INCLISIRAN SODIUM 284 MG/1.5ML ~~LOC~~ SOSY
284.0000 mg | PREFILLED_SYRINGE | Freq: Once | SUBCUTANEOUS | Status: AC
  Administered 2023-11-29: 284 mg via SUBCUTANEOUS
  Filled 2023-11-29: qty 1.5

## 2023-11-29 NOTE — Progress Notes (Signed)
 Diagnosis: Hyperlipidemia  Provider:  Chilton Greathouse MD  Procedure: Injection  Leqvio (inclisiran), Dose: 284 mg, Site: subcutaneous, Number of injections: 1  Injection Site(s): Right arm  Post Care: Patient declined observation  Discharge: Condition: Good, Destination: Home . AVS Provided  Performed by:  Loney Hering, LPN

## 2023-12-12 ENCOUNTER — Telehealth: Payer: Self-pay

## 2023-12-12 DIAGNOSIS — R7303 Prediabetes: Secondary | ICD-10-CM

## 2023-12-12 NOTE — Telephone Encounter (Signed)
Copied from CRM [id number]. Topic: Referral - Request for Referral >> Dec 12, 2023  9:10 AM Clide Dales wrote: Did the patient discuss referral with their provider in the last year? Yes (If No - schedule appointment) (If Yes - send message)  Appointment offered? No  Type of order/referral and detailed reason for visit: Diabetes Management  Preference of office, provider, location: Endocrinology/[facility]/Dr.Thomas O'Connell  If referral order, have you been seen by this specialty before? No (If Yes, this issue or another issue? When? Where?  Can we respond through MyChart? No

## 2023-12-12 NOTE — Telephone Encounter (Signed)
He has prediabetes, not diabetes, and his blood sugar was good when last checked in September. Endocrinologist don't typically treat prediabetes. But he should make a follow appointment here sometime within the next month to check on his A1c.

## 2023-12-13 NOTE — Telephone Encounter (Signed)
Spoke with patient. Patient still insist to be refer to Endocrine. He has other issues to address with them.

## 2023-12-23 DIAGNOSIS — H353131 Nonexudative age-related macular degeneration, bilateral, early dry stage: Secondary | ICD-10-CM | POA: Diagnosis not present

## 2023-12-30 ENCOUNTER — Other Ambulatory Visit: Payer: Self-pay | Admitting: Family Medicine

## 2023-12-30 DIAGNOSIS — M810 Age-related osteoporosis without current pathological fracture: Secondary | ICD-10-CM

## 2023-12-30 NOTE — Telephone Encounter (Signed)
 [facility] faxed refill request for the following medications:   alendronate  (FOSAMAX ) 70 MG tablet     Please advise.

## 2023-12-31 MED ORDER — ALENDRONATE SODIUM 70 MG PO TABS: 70.0000 mg | ORAL_TABLET | ORAL | 4 refills | Status: DC

## 2024-01-17 ENCOUNTER — Telehealth: Payer: Self-pay

## 2024-01-17 NOTE — Telephone Encounter (Signed)
 Auth Submission: NO AUTH NEEDED Site of care: Site of care: [facility] Payer: medicare a/b & tricare Medication & CPT/J Code(s) submitted: Leqvio (Inclisiran) O121283 Route of submission (phone, fax, portal):  Phone # Fax # Auth type: Buy/Bill Units/visits requested: 284mg  x 2 doses Reference number:  Approval from: 02/13/23 to 12/19/24

## 2024-02-17 DIAGNOSIS — M47816 Spondylosis without myelopathy or radiculopathy, lumbar region: Secondary | ICD-10-CM | POA: Diagnosis not present

## 2024-02-17 DIAGNOSIS — M5416 Radiculopathy, lumbar region: Secondary | ICD-10-CM | POA: Diagnosis not present

## 2024-02-17 DIAGNOSIS — M5126 Other intervertebral disc displacement, lumbar region: Secondary | ICD-10-CM | POA: Diagnosis not present

## 2024-02-17 DIAGNOSIS — M48061 Spinal stenosis, lumbar region without neurogenic claudication: Secondary | ICD-10-CM | POA: Diagnosis not present

## 2024-02-24 DIAGNOSIS — R7303 Prediabetes: Secondary | ICD-10-CM | POA: Diagnosis not present

## 2024-02-24 DIAGNOSIS — M810 Age-related osteoporosis without current pathological fracture: Secondary | ICD-10-CM | POA: Diagnosis not present

## 2024-02-24 DIAGNOSIS — N1831 Chronic kidney disease, stage 3a: Secondary | ICD-10-CM | POA: Diagnosis not present

## 2024-02-24 DIAGNOSIS — R7989 Other specified abnormal findings of blood chemistry: Secondary | ICD-10-CM | POA: Diagnosis not present

## 2024-02-24 DIAGNOSIS — R5383 Other fatigue: Secondary | ICD-10-CM | POA: Diagnosis not present

## 2024-02-24 LAB — HEMOGLOBIN A1C: Hemoglobin A1C: 6

## 2024-02-27 DIAGNOSIS — R7989 Other specified abnormal findings of blood chemistry: Secondary | ICD-10-CM | POA: Diagnosis not present

## 2024-02-28 DIAGNOSIS — E291 Testicular hypofunction: Secondary | ICD-10-CM | POA: Diagnosis not present

## 2024-02-28 DIAGNOSIS — R972 Elevated prostate specific antigen [PSA]: Secondary | ICD-10-CM | POA: Diagnosis not present

## 2024-02-28 DIAGNOSIS — Z1329 Encounter for screening for other suspected endocrine disorder: Secondary | ICD-10-CM | POA: Diagnosis not present

## 2024-03-09 ENCOUNTER — Other Ambulatory Visit: Payer: Self-pay | Admitting: Physician Assistant

## 2024-03-09 DIAGNOSIS — E079 Disorder of thyroid, unspecified: Secondary | ICD-10-CM

## 2024-03-10 ENCOUNTER — Encounter: Payer: Self-pay | Admitting: Internal Medicine

## 2024-03-10 DIAGNOSIS — Z6830 Body mass index (BMI) 30.0-30.9, adult: Secondary | ICD-10-CM | POA: Diagnosis not present

## 2024-03-10 DIAGNOSIS — E291 Testicular hypofunction: Secondary | ICD-10-CM | POA: Diagnosis not present

## 2024-03-10 DIAGNOSIS — N400 Enlarged prostate without lower urinary tract symptoms: Secondary | ICD-10-CM | POA: Diagnosis not present

## 2024-03-11 ENCOUNTER — Encounter: Payer: Self-pay | Admitting: Internal Medicine

## 2024-03-13 ENCOUNTER — Ambulatory Visit
Admission: RE | Admit: 2024-03-13 | Discharge: 2024-03-13 | Disposition: A | Discharge status: Home / Self Care | Source: Ambulatory Visit | Attending: Physician Assistant | Admitting: Physician Assistant

## 2024-03-13 DIAGNOSIS — E079 Disorder of thyroid, unspecified: Secondary | ICD-10-CM | POA: Diagnosis not present

## 2024-03-13 DIAGNOSIS — E041 Nontoxic single thyroid nodule: Secondary | ICD-10-CM | POA: Diagnosis not present

## 2024-04-06 DIAGNOSIS — Z08 Encounter for follow-up examination after completed treatment for malignant neoplasm: Secondary | ICD-10-CM | POA: Diagnosis not present

## 2024-04-06 DIAGNOSIS — Z85828 Personal history of other malignant neoplasm of skin: Secondary | ICD-10-CM | POA: Diagnosis not present

## 2024-04-06 DIAGNOSIS — L578 Other skin changes due to chronic exposure to nonionizing radiation: Secondary | ICD-10-CM | POA: Diagnosis not present

## 2024-04-06 DIAGNOSIS — L570 Actinic keratosis: Secondary | ICD-10-CM | POA: Diagnosis not present

## 2024-04-21 ENCOUNTER — Other Ambulatory Visit: Payer: Self-pay

## 2024-04-21 DIAGNOSIS — E785 Hyperlipidemia, unspecified: Secondary | ICD-10-CM

## 2024-05-18 DIAGNOSIS — R7989 Other specified abnormal findings of blood chemistry: Secondary | ICD-10-CM | POA: Diagnosis not present

## 2024-05-18 DIAGNOSIS — R7303 Prediabetes: Secondary | ICD-10-CM | POA: Diagnosis not present

## 2024-05-18 DIAGNOSIS — M47816 Spondylosis without myelopathy or radiculopathy, lumbar region: Secondary | ICD-10-CM | POA: Diagnosis not present

## 2024-05-18 DIAGNOSIS — M5126 Other intervertebral disc displacement, lumbar region: Secondary | ICD-10-CM | POA: Diagnosis not present

## 2024-05-18 DIAGNOSIS — M48061 Spinal stenosis, lumbar region without neurogenic claudication: Secondary | ICD-10-CM | POA: Diagnosis not present

## 2024-05-18 DIAGNOSIS — D649 Anemia, unspecified: Secondary | ICD-10-CM | POA: Diagnosis not present

## 2024-05-18 DIAGNOSIS — M810 Age-related osteoporosis without current pathological fracture: Secondary | ICD-10-CM | POA: Diagnosis not present

## 2024-05-18 DIAGNOSIS — E230 Hypopituitarism: Secondary | ICD-10-CM | POA: Diagnosis not present

## 2024-05-18 DIAGNOSIS — M5416 Radiculopathy, lumbar region: Secondary | ICD-10-CM | POA: Diagnosis not present

## 2024-05-29 ENCOUNTER — Ambulatory Visit: Payer: Medicare Other

## 2024-06-02 DIAGNOSIS — N401 Enlarged prostate with lower urinary tract symptoms: Secondary | ICD-10-CM | POA: Diagnosis not present

## 2024-06-02 DIAGNOSIS — R972 Elevated prostate specific antigen [PSA]: Secondary | ICD-10-CM | POA: Diagnosis not present

## 2024-06-02 DIAGNOSIS — E291 Testicular hypofunction: Secondary | ICD-10-CM | POA: Diagnosis not present

## 2024-06-02 DIAGNOSIS — R3911 Hesitancy of micturition: Secondary | ICD-10-CM | POA: Diagnosis not present

## 2024-06-02 DIAGNOSIS — R351 Nocturia: Secondary | ICD-10-CM | POA: Diagnosis not present

## 2024-06-02 DIAGNOSIS — R3915 Urgency of urination: Secondary | ICD-10-CM | POA: Diagnosis not present

## 2024-06-02 DIAGNOSIS — R3912 Poor urinary stream: Secondary | ICD-10-CM | POA: Diagnosis not present

## 2024-06-05 ENCOUNTER — Other Ambulatory Visit: Payer: Self-pay | Admitting: Urology

## 2024-06-05 ENCOUNTER — Encounter: Payer: Self-pay | Admitting: Internal Medicine

## 2024-06-05 DIAGNOSIS — R972 Elevated prostate specific antigen [PSA]: Secondary | ICD-10-CM

## 2024-06-10 ENCOUNTER — Telehealth: Payer: Self-pay | Admitting: Family Medicine

## 2024-06-10 NOTE — Telephone Encounter (Signed)
 [facility] is asking for refills on Tadalafil  5 mg #90 with 3 refills

## 2024-06-12 ENCOUNTER — Encounter: Payer: Self-pay | Admitting: Family Medicine

## 2024-06-12 ENCOUNTER — Ambulatory Visit (INDEPENDENT_AMBULATORY_CARE_PROVIDER_SITE_OTHER): Admitting: Family Medicine

## 2024-06-12 VITALS — BP 128/70 | HR 78 | Resp 16 | Ht 68.0 in | Wt 196.0 lb

## 2024-06-12 DIAGNOSIS — E559 Vitamin D deficiency, unspecified: Secondary | ICD-10-CM

## 2024-06-12 DIAGNOSIS — E782 Mixed hyperlipidemia: Secondary | ICD-10-CM

## 2024-06-12 DIAGNOSIS — M810 Age-related osteoporosis without current pathological fracture: Secondary | ICD-10-CM

## 2024-06-12 DIAGNOSIS — Z8739 Personal history of other diseases of the musculoskeletal system and connective tissue: Secondary | ICD-10-CM

## 2024-06-12 DIAGNOSIS — E790 Hyperuricemia without signs of inflammatory arthritis and tophaceous disease: Secondary | ICD-10-CM

## 2024-06-12 DIAGNOSIS — R739 Hyperglycemia, unspecified: Secondary | ICD-10-CM | POA: Diagnosis not present

## 2024-06-12 DIAGNOSIS — E291 Testicular hypofunction: Secondary | ICD-10-CM

## 2024-06-12 MED ORDER — ALLOPURINOL 300 MG PO TABS: 300.0000 mg | ORAL_TABLET | Freq: Every morning | ORAL | 4 refills | Status: AC

## 2024-06-12 NOTE — Patient Instructions (Signed)
 Larry Crawford  Please review the attached list of medications and notify my office if there are any errors.   . Please bring all of your medications to every appointment so we can make sure that our medication list is the same as yours.

## 2024-06-12 NOTE — Progress Notes (Signed)
 Established patient visit   Patient: Larry Crawford   DOB: [birth year]   83 y.o. Male  MRN: [id number] Visit Date: 06/12/2024  Today's healthcare provider: Nancyann Perry, MD   Chief Complaint  Patient presents with   Medical Management of Chronic Issues   Subjective    HPI  Discussed the use of AI scribe software for clinical note transcription with the patient, who gave verbal consent to proceed.  History of Present Illness   Larry Crawford is an 83 year old male with prediabetes, hypertension, hypotestosteronism, and hyperlipidemia who presents for a follow-up visit and prescription refill.  He was prescribed a new cholesterol medication by cardiology, an injection every six months, which led to the development of 'knots' in various locations. He is currently under the care of an endocrinologist and has an upcoming biopsy scheduled. An ultrasound has already been performed.  His testosterone  levels were found to be low at [facility]. endocrinology, and he has been receiving testosterone  shots, which have raised his levels to about 600. He states he recently had a prostate exam, which was smooth but enlarged, and is scheduled for an MRI on Monday.  He is currently taking allopurinol , initially at 300 mg, but reduced to 150 mg to see if the lower dose was sufficient. However, he experienced a recurrence of gout symptoms at the lower dose and requests to return to 300 mg. He is no longer taking Wellbutrin but is on fluoxetine.  He is also here to follow up Fosamax  use which he started two years ago after a low bone density test.  No chest pain, heart flutter, or shortness of breath.     Lab Results  Component Value Date   HGBA1C 6.0% 02/24/2024     Medications: Outpatient Medications Prior to Visit  Medication Sig   alendronate  (FOSAMAX ) 70 MG tablet Take 1 tablet (70 mg total) by mouth once a week. Take with a full glass of water on an empty stomach.   alfuzosin (UROXATRAL)  10 MG 24 hr tablet Take 10 mg by mouth daily.   cetirizine (ZYRTEC) 10 MG tablet Take 10 mg by mouth in the morning.   Coenzyme Q10 (COQ10) 100 MG CAPS Take 100 mg by mouth in the morning.   Cyanocobalamin  (VITAMIN B-12) 5000 MCG SUBL Place 5,000 mcg under the tongue in the morning.   DERMOTIC 0.01 % OIL Apply 1 application  topically See admin instructions. 1 application in the ears once or twice weekly   Elderberry 500 MG CAPS Take by mouth.   famotidine (PEPCID) 20 MG tablet Take 20 mg by mouth daily as needed for heartburn or indigestion.   FLUoxetine HCl (PROZAC PO) Take by mouth daily.   fluticasone  (FLONASE ) 50 MCG/ACT nasal spray Place 1 spray into both nostrils daily as needed for allergies or rhinitis.   HYDROcodone -acetaminophen  (NORCO) 5-325 MG tablet Take 1 tablet by mouth every 6 (six) hours as needed for moderate pain.   L-Arginine 1000 MG TABS Take 1,000 mg by mouth 3 (three) times a week.   Multiple Vitamin (MULTIVITAMIN WITH MINERALS) TABS tablet Take 1 tablet by mouth in the morning.   Multiple Vitamins-Minerals (PRESERVISION AREDS 2 PO) Take 1 tablet by mouth in the morning.   niacin 500 MG tablet Take 500 mg by mouth in the morning.   tadalafil  (CIALIS ) 5 MG tablet TAKE 1 TABLET EVERY MORNING   buPROPion (WELLBUTRIN XL) 150 MG 24 hr tablet Take 150 mg by  mouth daily.   allopurinol  (ZYLOPRIM ) 300 MG tablet Take 0.5 tablets (150 mg total) by mouth in the morning.   No facility-administered medications prior to visit.    Review of Systems  Constitutional:  Negative for appetite change, chills and fever.  Respiratory:  Negative for chest tightness, shortness of breath and wheezing.   Cardiovascular:  Negative for chest pain and palpitations.  Gastrointestinal:  Negative for abdominal pain, nausea and vomiting.       Objective    BP 128/70 (BP Location: Left Arm, Patient Position: Sitting, Cuff Size: Normal)   Pulse 78   Resp 16   Ht 5' 8 (1.727 m)   Wt 196 lb  (88.9 kg)   SpO2 99%   BMI 29.80 kg/m    Physical Exam   General: Appearance:    Well developed, well nourished male in no acute distress  Eyes:    PERRL, conjunctiva/corneas clear, EOM's intact       Lungs:     Clear to auscultation bilaterally, respirations unlabored  Heart:    Normal heart rate. Normal rhythm. No murmurs, rubs, or gallops.    MS:   All extremities are intact.    Neurologic:   Awake, alert, oriented x 3. No apparent focal neurological defect.         Results for orders placed or performed in visit on 06/12/24  Hemoglobin A1c  Result Value Ref Range   Hemoglobin A1C 6.0%     Assessment & Plan     1. History of gout (Primary) Having more flares since reducing allopurinol  to 150mg  a day .   2. Hyperuricemia Increase allopurinol  back up to a full 300mg  tablet every day.   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  - DG Bone Density; Future  4. Hyperglycemia A1c stable most recently checked at [facility].   5. Mixed hyperlipidemia Now managed by cardiology lipid specialist.   6. Hypogonadism, testicular Managed through endocrinology and urology. Schedule for prostate MRI this week.   Other orders - allopurinol  (ZYLOPRIM ) 300 MG tablet; Take 1 tablet (300 mg total) by mouth in the morning.  Dispense: 90 tablet; Refill: 4 - Hemoglobin A1c   Return in about 1 year (around 06/12/2025).        Nancyann Perry, MD  [facility] [phone] (phone) [phone] (fax)  [facility]

## 2024-06-15 ENCOUNTER — Other Ambulatory Visit: Payer: Self-pay | Admitting: Family Medicine

## 2024-06-15 ENCOUNTER — Ambulatory Visit
Admission: RE | Admit: 2024-06-15 | Discharge: 2024-06-15 | Disposition: A | Discharge status: Home / Self Care | Source: Ambulatory Visit | Attending: Urology

## 2024-06-15 DIAGNOSIS — R972 Elevated prostate specific antigen [PSA]: Secondary | ICD-10-CM

## 2024-06-15 DIAGNOSIS — H353131 Nonexudative age-related macular degeneration, bilateral, early dry stage: Secondary | ICD-10-CM | POA: Diagnosis not present

## 2024-06-15 DIAGNOSIS — N529 Male erectile dysfunction, unspecified: Secondary | ICD-10-CM

## 2024-06-15 DIAGNOSIS — N400 Enlarged prostate without lower urinary tract symptoms: Secondary | ICD-10-CM

## 2024-06-15 MED ORDER — GADOPICLENOL 0.5 MMOL/ML IV SOLN
10.0000 mL | Freq: Once | INTRAVENOUS | Status: AC | PRN
  Administered 2024-06-15: 10 mL via INTRAVENOUS

## 2024-06-15 NOTE — Telephone Encounter (Unsigned)
 Copied from CRM [id number]. Topic: Clinical - Medication Refill >> Jun 15, 2024  9:11 AM Willma R wrote: Medication: tadalafil  (CIALIS ) 5 MG tablet *requesting a refill for the years  Has the patient contacted their pharmacy? Yes, call dr  This is the patient's preferred pharmacy:  [facility] - [place], [place] - [address] [place] [address] Phone: [phone] Fax: [phone]  Is this the correct pharmacy for this prescription? Yes If no, delete pharmacy and type the correct one.   Has the prescription been filled recently? No  Is the patient out of the medication? No  Has the patient been seen for an appointment in the last year OR does the patient have an upcoming appointment? Yes  Can we respond through MyChart? Yes  Agent: Please be advised that Rx refills may take up to 3 business days. We ask that you follow-up with your pharmacy.

## 2024-06-16 MED ORDER — TADALAFIL 5 MG PO TABS: 5.0000 mg | ORAL_TABLET | Freq: Every morning | ORAL | 1 refills | Status: AC

## 2024-06-16 NOTE — Telephone Encounter (Signed)
 Requested Prescriptions  Pending Prescriptions Disp Refills   tadalafil  (CIALIS ) 5 MG tablet 90 tablet 1    Sig: Take 1 tablet (5 mg total) by mouth every morning.     Urology: Erectile Dysfunction Agents Passed - 06/16/2024  1:52 PM      Passed - AST in normal range and within 360 days    AST  Date Value Ref Range Status  07/30/2023 19 0 - 40 IU/L Final         Passed - ALT in normal range and within 360 days    ALT  Date Value Ref Range Status  07/30/2023 15 0 - 44 IU/L Final         Passed - Last BP in normal range    BP Readings from Last 1 Encounters:  06/12/24 128/70         Passed - Valid encounter within last 12 months    Recent Outpatient Visits           4 days ago History of gout   [facility] Gasper Nancyann BRAVO, MD

## 2024-06-30 ENCOUNTER — Telehealth: Payer: Self-pay

## 2024-06-30 DIAGNOSIS — E042 Nontoxic multinodular goiter: Secondary | ICD-10-CM | POA: Diagnosis not present

## 2024-06-30 DIAGNOSIS — E230 Hypopituitarism: Secondary | ICD-10-CM | POA: Diagnosis not present

## 2024-06-30 DIAGNOSIS — Z5181 Encounter for therapeutic drug level monitoring: Secondary | ICD-10-CM | POA: Diagnosis not present

## 2024-06-30 DIAGNOSIS — M810 Age-related osteoporosis without current pathological fracture: Secondary | ICD-10-CM | POA: Diagnosis not present

## 2024-06-30 DIAGNOSIS — R7303 Prediabetes: Secondary | ICD-10-CM | POA: Diagnosis not present

## 2024-06-30 NOTE — Telephone Encounter (Signed)
 Called patient to schedule 1 year follow up with Dr. Mona. Patient stated he did not receive Leqvio  infusion in July 2025 and will not take medication again. I asked patient what his symptoms were and he stated he felt off on medication. I offered an appointment to follow up and discuss options and patient stated I am not doing that again. I instructed patient to call our office if any cardiac symptoms arise or if he changes his mind about being seen. Patient verbalized understanding.  Josie RN

## 2024-07-21 DIAGNOSIS — E042 Nontoxic multinodular goiter: Secondary | ICD-10-CM | POA: Diagnosis not present

## 2024-07-30 ENCOUNTER — Telehealth: Payer: Self-pay

## 2024-07-30 NOTE — Telephone Encounter (Signed)
 Copied from CRM #[id number]. Topic: Referral - Question >> Jul 30, 2024 10:12 AM Emylou G wrote: Reason for CRM: Dentist told him yesterday he needs a referral to find out the type tooth implant his?  (([facility] [phone] )) Pls call patient - said was a bit confused on what he needs so he can get his teeth worked on.SABRASABRA

## 2024-08-03 DIAGNOSIS — R3912 Poor urinary stream: Secondary | ICD-10-CM | POA: Diagnosis not present

## 2024-08-03 DIAGNOSIS — R350 Frequency of micturition: Secondary | ICD-10-CM | POA: Diagnosis not present

## 2024-08-03 DIAGNOSIS — R3915 Urgency of urination: Secondary | ICD-10-CM | POA: Diagnosis not present

## 2024-08-03 DIAGNOSIS — N401 Enlarged prostate with lower urinary tract symptoms: Secondary | ICD-10-CM | POA: Diagnosis not present

## 2024-08-03 DIAGNOSIS — R3911 Hesitancy of micturition: Secondary | ICD-10-CM | POA: Diagnosis not present

## 2024-08-03 NOTE — Telephone Encounter (Signed)
 Is this a request to be referred to the [facility]? Or is that who is telling him that he needs a referral.

## 2024-08-11 DIAGNOSIS — R3915 Urgency of urination: Secondary | ICD-10-CM | POA: Diagnosis not present

## 2024-08-11 DIAGNOSIS — R351 Nocturia: Secondary | ICD-10-CM | POA: Diagnosis not present

## 2024-08-11 DIAGNOSIS — R3912 Poor urinary stream: Secondary | ICD-10-CM | POA: Diagnosis not present

## 2024-08-11 DIAGNOSIS — R972 Elevated prostate specific antigen [PSA]: Secondary | ICD-10-CM | POA: Diagnosis not present

## 2024-08-11 DIAGNOSIS — R3914 Feeling of incomplete bladder emptying: Secondary | ICD-10-CM | POA: Diagnosis not present

## 2024-08-11 DIAGNOSIS — E291 Testicular hypofunction: Secondary | ICD-10-CM | POA: Diagnosis not present

## 2024-08-11 DIAGNOSIS — N401 Enlarged prostate with lower urinary tract symptoms: Secondary | ICD-10-CM | POA: Diagnosis not present

## 2024-08-18 DIAGNOSIS — M5126 Other intervertebral disc displacement, lumbar region: Secondary | ICD-10-CM | POA: Diagnosis not present

## 2024-08-18 DIAGNOSIS — M5416 Radiculopathy, lumbar region: Secondary | ICD-10-CM | POA: Diagnosis not present

## 2024-08-18 DIAGNOSIS — M48061 Spinal stenosis, lumbar region without neurogenic claudication: Secondary | ICD-10-CM | POA: Diagnosis not present

## 2024-08-18 DIAGNOSIS — Z79899 Other long term (current) drug therapy: Secondary | ICD-10-CM | POA: Diagnosis not present

## 2024-08-18 DIAGNOSIS — M5380 Other specified dorsopathies, site unspecified: Secondary | ICD-10-CM | POA: Diagnosis not present

## 2024-09-03 DIAGNOSIS — M5459 Other low back pain: Secondary | ICD-10-CM | POA: Diagnosis not present

## 2024-09-03 DIAGNOSIS — M546 Pain in thoracic spine: Secondary | ICD-10-CM | POA: Diagnosis not present

## 2024-09-09 DIAGNOSIS — M546 Pain in thoracic spine: Secondary | ICD-10-CM | POA: Diagnosis not present

## 2024-09-09 DIAGNOSIS — M5459 Other low back pain: Secondary | ICD-10-CM | POA: Diagnosis not present

## 2024-09-16 DIAGNOSIS — M5459 Other low back pain: Secondary | ICD-10-CM | POA: Diagnosis not present

## 2024-09-16 DIAGNOSIS — M546 Pain in thoracic spine: Secondary | ICD-10-CM | POA: Diagnosis not present

## 2024-09-22 DIAGNOSIS — C73 Malignant neoplasm of thyroid gland: Secondary | ICD-10-CM | POA: Diagnosis not present

## 2024-09-23 DIAGNOSIS — M546 Pain in thoracic spine: Secondary | ICD-10-CM | POA: Diagnosis not present

## 2024-09-23 DIAGNOSIS — M5459 Other low back pain: Secondary | ICD-10-CM | POA: Diagnosis not present

## 2024-09-25 ENCOUNTER — Ambulatory Visit
Admission: EM | Admit: 2024-09-25 | Discharge: 2024-09-25 | Disposition: A | Discharge status: Home / Self Care | Attending: Emergency Medicine | Admitting: Emergency Medicine

## 2024-09-25 ENCOUNTER — Encounter: Payer: Self-pay | Admitting: Internal Medicine

## 2024-09-25 DIAGNOSIS — R369 Urethral discharge, unspecified: Secondary | ICD-10-CM | POA: Diagnosis not present

## 2024-09-25 DIAGNOSIS — R300 Dysuria: Secondary | ICD-10-CM | POA: Insufficient documentation

## 2024-09-25 LAB — POCT URINE DIPSTICK
Bilirubin, UA: NEGATIVE
Glucose, UA: NEGATIVE mg/dL
Ketones, POC UA: NEGATIVE mg/dL
Leukocytes, UA: NEGATIVE
Nitrite, UA: NEGATIVE
POC PROTEIN,UA: NEGATIVE
Spec Grav, UA: 1.02 (ref 1.010–1.025)
Urobilinogen, UA: 0.2 U/dL
pH, UA: 6 (ref 5.0–8.0)

## 2024-09-25 MED ORDER — CEPHALEXIN 500 MG PO CAPS: 500.0000 mg | ORAL_CAPSULE | Freq: Two times a day (BID) | ORAL | 0 refills | Status: AC

## 2024-09-25 NOTE — ED Triage Notes (Signed)
 Patient presents to [facility] for penile drainage. Reports yellowish in color. Intermittent dysuria. Reports symptom onset x 2 days. No OTC meds for symptom relief. Concerned with UTI. He is sexually active and agreeable to STD screening.

## 2024-09-25 NOTE — Discharge Instructions (Signed)
 Your urinalysis shows Larry Crawford blood cells but does not show bacteria, your urine will be sent to the lab to determine exactly which bacteria is present, if any changes need to be made to your medications you will be notified  Penile swab checking for gonorrhea chlamydia and trichomoniasis pending 2 to 3 days and you will be notified of positive test results only and additional treatment sent to pharmacy, will have to return to the clinic if positive for gonorrhea as treatment is an injection  Begin use of cephalexin twice daily for 5 days as you are symptomatic  You may use over-the-counter Azo to help minimize your symptoms until antibiotic removes bacteria, this medication will turn your urine orange  Increase your fluid intake through use of water  If symptoms continue to persist after use of medication or recur please follow-up with urgent care or your primary doctor as needed

## 2024-09-25 NOTE — ED Provider Notes (Signed)
 Larry Crawford    CSN: [id number] Arrival date & time: 09/25/24  9176      History   Chief Complaint Chief Complaint  Patient presents with   Penile Discharge    HPI Larry Crawford is a 83 y.o. male.   Patient presents for evaluation of yellow penile discharge and dysuria beginning 2 days ago.  Noticed a small amount of blood after urination while in clinic today.  Has not attempted treatment.  Denies frequency, urgency, abdominal or flank pain, fever, penile or testicle swelling, new rash.  Past Medical History:  Diagnosis Date   Allergy    Anxiety    Arthritis    BPH (benign prostatic hypertrophy)    Cataract    Colon polyp    Depression    Elevated red blood cell count    Erectile dysfunction    GERD (gastroesophageal reflux disease)    Gout    History of chicken pox    History of measles    History of mumps    Hyperlipidemia    IBS (irritable bowel syndrome)    MRSA infection 2000   nasal   Osteoporosis    Skin cancer (melanoma) (HCC) 2021   left arm    Patient Active Problem List   Diagnosis Date Noted   Hyperuricemia 06/12/2024   Anxiety and depression 11/21/2022   Osteoporosis 06/19/2022   Compression fracture of third lumbar vertebra (HCC) 04/11/2022   Closed compression fracture of first lumbar vertebra (HCC) 04/11/2022   Hyperglycemia 12/14/2016   Arthritis 07/16/2016     09/28/2015   Alopecia 09/26/2015   BPH (benign prostatic hyperplasia) 09/26/2015   Bursitis of left shoulder 09/26/2015   History of depression 09/26/2015   Tinnitus 09/26/2015   Vitamin D  deficiency 09/26/2015   ED (erectile dysfunction) of organic origin 11/27/2013   Elevated prostate specific antigen (PSA) 08/25/2012   History of colon polyps 02/21/2010   Hypogonadism, 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  Screening ultrasound at [facility] per pt report   BCC Excised from chest  1984   BUNIONECTOMY Right    great toe   CATARACT EXTRACTION, BILATERAL     CHOLECYSTECTOMY  1997   COLONOSCOPY     COSMETIC SURGERY     EYE SURGERY     FRACTURE SURGERY     INGUINAL HERNIA REPAIR  [phone]   2 on right/1 on left   IR KYPHO LUMBAR INC FX REDUCE BONE BX UNI/BIL CANNULATION INC/IMAGING  02/16/2022   IR RADIOLOGIST EVAL & MGMT  01/30/2022   IR RADIOLOGIST EVAL & MGMT  02/27/2022   JOINT REPLACEMENT  Jan 10 2021   Partial rt knee   KYPHOPLASTY N/A 04/03/2021   Procedure: L1 KYPHOPLASTY AND BONE BIOPDY;  Surgeon: Kathlynn Sharper, MD;  Location: [facility] ORS;  Service: Orthopedics;  Laterality: N/A;   LIPOMA EXCISION  2013   NOSE SURGERY     divided septum   PARTIAL KNEE ARTHROPLASTY Right 01/10/2021   Procedure: UNICOMPARTMENTAL KNEE;  Surgeon: Edie Norleen PARAS, MD;  Location: [facility] ORS;  Service: Orthopedics;  Laterality: Right;   PENILE PROSTHESIS IMPLANT  12/17/2013   POLYPECTOMY     PROSTATE SURGERY     Rezum procedure done in office   RIH  1987   X2   SHOULDER ARTHROSCOPY  1984 and 1987  left X2 for spurs   TONSILLECTOMY AND ADENOIDECTOMY  2002   UMBILICAL HERNIA REPAIR         Home Medications    Prior to Admission medications   Medication Sig Start Date End Date Taking? Authorizing Provider  cephALEXin (KEFLEX) 500 MG capsule Take 1 capsule (500 mg total) by mouth 2 (two) times daily for 5 days. 09/25/24 09/30/24 Yes Kaileb Monsanto, Shelba SAUNDERS, NP  cycloSPORINE (RESTASIS) 0.05 % ophthalmic emulsion  05/26/24  Yes [provider]  DESCOVY 200-25 MG tablet Take 1 tablet by mouth daily. 09/14/24  Yes [provider]  alendronate  (FOSAMAX ) 70 MG tablet Take 1 tablet (70 mg total) by mouth once a week. Take with a full glass of water on an empty stomach. 12/31/23   Gasper Nancyann BRAVO, MD  alfuzosin (UROXATRAL) 10 MG 24 hr tablet Take 10 mg by mouth daily. 06/01/23   Carolee Sherwood JONETTA DOUGLAS, MD   allopurinol  (ZYLOPRIM ) 300 MG tablet Take 1 tablet (300 mg total) by mouth in the morning. 06/12/24   Gasper Nancyann BRAVO, MD  buPROPion (WELLBUTRIN XL) 150 MG 24 hr tablet Take 150 mg by mouth daily. 03/26/23   [provider]  cetirizine (ZYRTEC) 10 MG tablet Take 10 mg by mouth in the morning.    [provider]  Cholecalciferol 125 MCG (5000 UT) TABS Take 5,000 Units by mouth.    [provider]  Coenzyme Q10 (COQ10) 100 MG CAPS Take 100 mg by mouth in the morning.    [provider]  Cyanocobalamin  (VITAMIN B-12) 5000 MCG SUBL Place 5,000 mcg under the tongue in the morning.    [provider]  DERMOTIC 0.01 % OIL Apply 1 application  topically See admin instructions. 1 application in the ears once or twice weekly 09/13/20   [provider]  Elderberry 500 MG CAPS Take by mouth.    [provider]  famotidine (PEPCID) 20 MG tablet Take 20 mg by mouth daily as needed for heartburn or indigestion.    [provider]  FLUoxetine HCl (PROZAC PO) Take by mouth daily.    [provider]  fluticasone  (FLONASE ) 50 MCG/ACT nasal spray Place 1 spray into both nostrils daily as needed for allergies or rhinitis.    [provider]  HYDROcodone -acetaminophen  (NORCO) 5-325 MG tablet Take 1 tablet by mouth every 6 (six) hours as needed for moderate pain. 04/03/21   Kathlynn Sharper, MD  L-Arginine 1000 MG TABS Take 1,000 mg by mouth 3 (three) times a week.    [provider]  Multiple Vitamin (MULTI-VITAMIN) tablet Take 1 tablet by mouth daily.    [provider]  Multiple Vitamin (MULTIVITAMIN WITH MINERALS) TABS tablet Take 1 tablet by mouth in the morning.    [provider]  Multiple Vitamins-Minerals (PRESERVISION AREDS 2 PO) Take 1 tablet by mouth in the morning.    [provider]  niacin 500 MG tablet Take 500 mg by mouth in the morning.    [provider]  Niacin,  Antihyperlipidemic, 500 MG TABS Take 500 mg by mouth.    [provider]  tadalafil  (CIALIS ) 5 MG tablet Take 1 tablet (5 mg total) by mouth every morning. 06/16/24   Gasper Nancyann BRAVO, MD  testosterone  enanthate (DELATESTRYL) 200 MG/ML injection SMARTSIG:Milliliter(s) IM    [provider]  zinc gluconate 50 MG tablet Take 50 mg by mouth daily.    [provider]    Family History Family  History  Problem Relation Age of Onset   Hypothyroidism Mother    Diabetes Brother    Obesity Brother    Cancer Brother        throat    Social History Social History   Tobacco Use   Smoking status: Former    Current packs/day: 0.00    Average packs/day: 0.8 packs/day for 5.0 years (3.8 ttl pk-yrs)    Types: Cigarettes    Start date: 11/19/1953    Quit date: 11/19/1958    Years since quitting: 65.8   Smokeless tobacco: Never  Vaping Use   Vaping status: Never Used  Substance Use Topics   Alcohol use: Yes    Alcohol/week: 0.0 standard drinks of alcohol    Comment: 2 glasses of wine a month   Drug use: No     Allergies   Sulfa antibiotics, Tetracycline, Tetracyclines & related, Statins, Bempedoic acid , Colestipol hcl, Corn-containing products, Pravastatin sodium, Shellfish allergy, Sulfamethoxazole-trimethoprim, Welchol  [colesevelam ], and Zetia  [ezetimibe ]   Review of Systems Review of Systems   Physical Exam Triage Vital Signs ED Triage Vitals [09/25/24 0834]  Encounter Vitals Group     BP (!) 166/85     Girls Systolic BP Percentile      Girls Diastolic BP Percentile      Boys Systolic BP Percentile      Boys Diastolic BP Percentile      Pulse Rate 85     Resp 16     Temp (!) 97.3 F (36.3 C)     Temp Source Temporal     SpO2 98 %     Weight      Height      Head Circumference      Peak Flow      Pain Score      Pain Loc      Pain Education      Exclude from Growth Chart    No data found.  Updated Vital Signs BP (S) (!) 167/70 (BP Location:  Left Arm) Comment: repeat, no hx of HTN  Pulse 85   Temp (!) 97.3 F (36.3 C) (Temporal)   Resp 16   SpO2 98%   Visual Acuity Right Eye Distance:   Left Eye Distance:   Bilateral Distance:    Right Eye Near:   Left Eye Near:    Bilateral Near:     Physical Exam Constitutional:      Appearance: Normal appearance.  Eyes:     Extraocular Movements: Extraocular movements intact.  Pulmonary:     Effort: Pulmonary effort is normal.  Abdominal:     Tenderness: There is no abdominal tenderness. There is no right CVA tenderness, left CVA tenderness or guarding.  Genitourinary:    Comments: deferred Neurological:     Mental Status: He is alert and oriented to person, place, and time. Mental status is at baseline.      UC Treatments / Results  Labs (all labs ordered are listed, but only abnormal results are displayed) Labs Reviewed  URINE CULTURE  POCT URINE DIPSTICK  CYTOLOGY, (ORAL, ANAL, URETHRAL) ANCILLARY ONLY    EKG   Radiology No results found.  Procedures Procedures (including critical care time)  Medications Ordered in UC Medications - No data to display  Initial Impression / Assessment and Plan / UC Course  I have reviewed the triage vital signs and the nursing notes.  Pertinent labs & imaging results that were available during my care of the patient were reviewed  by me and considered in my medical decision making (see chart for details).  Penile discharge, dysuria  Urinalysis negative for leukocytes and nitrates, sent for culture, STI swab pending, will treat per protocol, empirically placed on cephalexin as he is symptomatic, recommended over-the-counter medications and nonpharmacological measures with follow-up as needed Final Clinical Impressions(s) / UC Diagnoses   Final diagnoses:  Penile discharge  Dysuria     Discharge Instructions      Your urinalysis shows Elizibeth Breau blood cells but does not show bacteria, your urine will be sent to the lab  to determine exactly which bacteria is present, if any changes need to be made to your medications you will be notified  Penile swab checking for gonorrhea chlamydia and trichomoniasis pending 2 to 3 days and you will be notified of positive test results only and additional treatment sent to pharmacy, will have to return to the clinic if positive for gonorrhea as treatment is an injection  Begin use of cephalexin twice daily for 5 days as you are symptomatic  You may use over-the-counter Azo to help minimize your symptoms until antibiotic removes bacteria, this medication will turn your urine orange  Increase your fluid intake through use of water  If symptoms continue to persist after use of medication or recur please follow-up with urgent care or your primary doctor as needed     ED Prescriptions     Medication Sig Dispense Auth. Provider   cephALEXin (KEFLEX) 500 MG capsule Take 1 capsule (500 mg total) by mouth 2 (two) times daily for 5 days. 10 capsule Baptiste Littler R, NP      PDMP not reviewed this encounter.   Teresa Shelba SAUNDERS, [place] 09/25/24 [phone]

## 2024-09-26 LAB — URINE CULTURE: Culture: NO GROWTH

## 2024-09-27 ENCOUNTER — Ambulatory Visit: Payer: Self-pay | Admitting: Emergency Medicine

## 2024-09-28 LAB — CYTOLOGY, (ORAL, ANAL, URETHRAL) ANCILLARY ONLY
Chlamydia: NEGATIVE
Comment: NEGATIVE
Comment: NEGATIVE
Comment: NORMAL
Neisseria Gonorrhea: NEGATIVE
Trichomonas: NEGATIVE

## 2024-09-30 DIAGNOSIS — M546 Pain in thoracic spine: Secondary | ICD-10-CM | POA: Diagnosis not present

## 2024-09-30 DIAGNOSIS — M5459 Other low back pain: Secondary | ICD-10-CM | POA: Diagnosis not present

## 2024-10-01 ENCOUNTER — Other Ambulatory Visit: Payer: Self-pay | Admitting: Family Medicine

## 2024-10-01 DIAGNOSIS — M810 Age-related osteoporosis without current pathological fracture: Secondary | ICD-10-CM

## 2024-10-06 DIAGNOSIS — M546 Pain in thoracic spine: Secondary | ICD-10-CM | POA: Diagnosis not present

## 2024-10-06 DIAGNOSIS — M5459 Other low back pain: Secondary | ICD-10-CM | POA: Diagnosis not present

## 2024-10-07 DIAGNOSIS — D225 Melanocytic nevi of trunk: Secondary | ICD-10-CM | POA: Diagnosis not present

## 2024-10-07 DIAGNOSIS — L578 Other skin changes due to chronic exposure to nonionizing radiation: Secondary | ICD-10-CM | POA: Diagnosis not present

## 2024-10-07 DIAGNOSIS — Z08 Encounter for follow-up examination after completed treatment for malignant neoplasm: Secondary | ICD-10-CM | POA: Diagnosis not present

## 2024-10-07 DIAGNOSIS — D492 Neoplasm of unspecified behavior of bone, soft tissue, and skin: Secondary | ICD-10-CM | POA: Diagnosis not present

## 2024-10-07 DIAGNOSIS — L814 Other melanin hyperpigmentation: Secondary | ICD-10-CM | POA: Diagnosis not present

## 2024-10-07 DIAGNOSIS — L2989 Other pruritus: Secondary | ICD-10-CM | POA: Diagnosis not present

## 2024-10-07 DIAGNOSIS — L570 Actinic keratosis: Secondary | ICD-10-CM | POA: Diagnosis not present

## 2024-10-07 DIAGNOSIS — Z85820 Personal history of malignant melanoma of skin: Secondary | ICD-10-CM | POA: Diagnosis not present

## 2024-10-07 DIAGNOSIS — L249 Irritant contact dermatitis, unspecified cause: Secondary | ICD-10-CM | POA: Diagnosis not present

## 2024-10-07 DIAGNOSIS — L821 Other seborrheic keratosis: Secondary | ICD-10-CM | POA: Diagnosis not present

## 2024-10-07 DIAGNOSIS — C44319 Basal cell carcinoma of skin of other parts of face: Secondary | ICD-10-CM | POA: Diagnosis not present

## 2024-10-13 DIAGNOSIS — M546 Pain in thoracic spine: Secondary | ICD-10-CM | POA: Diagnosis not present

## 2024-10-13 DIAGNOSIS — M5459 Other low back pain: Secondary | ICD-10-CM | POA: Diagnosis not present

## 2024-10-19 DIAGNOSIS — E042 Nontoxic multinodular goiter: Secondary | ICD-10-CM | POA: Diagnosis not present

## 2024-10-19 DIAGNOSIS — Z5181 Encounter for therapeutic drug level monitoring: Secondary | ICD-10-CM | POA: Diagnosis not present

## 2024-10-19 DIAGNOSIS — E230 Hypopituitarism: Secondary | ICD-10-CM | POA: Diagnosis not present

## 2024-10-19 DIAGNOSIS — R7303 Prediabetes: Secondary | ICD-10-CM | POA: Diagnosis not present

## 2024-10-21 DIAGNOSIS — C44319 Basal cell carcinoma of skin of other parts of face: Secondary | ICD-10-CM | POA: Diagnosis not present

## 2024-10-22 DIAGNOSIS — M546 Pain in thoracic spine: Secondary | ICD-10-CM | POA: Diagnosis not present

## 2024-10-22 DIAGNOSIS — M5459 Other low back pain: Secondary | ICD-10-CM | POA: Diagnosis not present

## 2024-10-26 DIAGNOSIS — E042 Nontoxic multinodular goiter: Secondary | ICD-10-CM | POA: Diagnosis not present

## 2024-10-26 DIAGNOSIS — R7303 Prediabetes: Secondary | ICD-10-CM | POA: Diagnosis not present

## 2024-10-26 DIAGNOSIS — M810 Age-related osteoporosis without current pathological fracture: Secondary | ICD-10-CM | POA: Diagnosis not present

## 2024-10-26 DIAGNOSIS — Z5181 Encounter for therapeutic drug level monitoring: Secondary | ICD-10-CM | POA: Diagnosis not present

## 2024-10-26 DIAGNOSIS — E230 Hypopituitarism: Secondary | ICD-10-CM | POA: Diagnosis not present

## 2024-11-03 ENCOUNTER — Ambulatory Visit: Payer: Self-pay

## 2024-11-03 VITALS — BP 142/76 | Ht 68.0 in | Wt 194.1 lb

## 2024-11-03 DIAGNOSIS — Z0000 Encounter for general adult medical examination without abnormal findings: Secondary | ICD-10-CM | POA: Diagnosis not present

## 2024-11-03 NOTE — Patient Instructions (Addendum)
 Larry Crawford,  Thank you for taking the time for your Medicare Wellness Visit. I appreciate your continued commitment to your health goals. Please review the care plan we discussed, and feel free to reach out if I can assist you further.  Please note that Annual Wellness Visits do not include a physical exam. Some assessments may be limited, especially if the visit was conducted virtually. If needed, we may recommend an in-person follow-up with your provider.  Ongoing Care Seeing your primary care provider every 3 to 6 months helps us  monitor your health and provide consistent, personalized care.   Referrals If a referral was made during today's visit and you haven't received any updates within two weeks, please contact the referred provider directly to check on the status.  PLEASE CALL TO SCHEDULE YOUR BONE DENSITY SCAN: [phone]  Recommended Screenings:  Health Maintenance  Topic Date Due   Osteoporosis screening with Bone Density Scan  06/06/2024   COVID-19 Vaccine (6 - 2025-26 season) 07/20/2024   Medicare Annual Wellness Visit  11/03/2025   DTaP/Tdap/Td vaccine (3 - Td or Tdap) 12/28/2027   Pneumococcal Vaccine for age over 71  Completed   Flu Shot  Completed   Zoster (Shingles) Vaccine  Completed   Meningitis B Vaccine  Aged Out   Colon Cancer Screening  Discontinued     Vision: Annual vision screenings are recommended for early detection of glaucoma, cataracts, and diabetic retinopathy. These exams can also reveal signs of chronic conditions such as diabetes and high blood pressure.  Dental: Annual dental screenings help detect early signs of oral cancer, gum disease, and other conditions linked to overall health, including heart disease and diabetes.  Please see the attached documents for additional preventive care recommendations.   NEXT AWV 11/09/25 @ 8:50 AM IN PERSON

## 2024-11-03 NOTE — Progress Notes (Signed)
 Chief Complaint  Patient presents with   Medicare Wellness     Subjective:   Larry Crawford is a 83 y.o. male who presents for a Medicare Annual Wellness Visit.  Visit info / Clinical Intake: Medicare Wellness Visit Type:: Subsequent Annual Wellness Visit Persons participating in visit and providing information:: patient Medicare Wellness Visit Mode:: In-person (required for WTM) Interpreter Needed?: No Pre-visit prep was completed: yes AWV questionnaire completed by patient prior to visit?: yes Date:: 10/30/24 Living arrangements:: lives with spouse/significant other Patient's Overall Health Status Rating: good Typical amount of pain: some Does pain affect daily life?: (!) yes Are you currently prescribed opioids?: (!) yes  Dietary Habits and Nutritional Risks How many meals a day?: 3 Eats fruit and vegetables daily?: yes Most meals are obtained by: preparing own meals In the last 2 weeks, have you had any of the following?: none Diabetic:: no  Functional Status Activities of Daily Living (to include ambulation/medication): Independent Ambulation: Independent Medication Administration: Independent Home Management (perform basic housework or laundry): Independent Manage your own finances?: yes Primary transportation is: driving Concerns about vision?: no *vision screening is required for WTM* (reading, driving- Dr.Bell) Concerns about hearing?: no (hearing 'comes and goes')  Fall Screening Falls in the past year?: 0 Number of falls in past year: 0 Was there an injury with Fall?: 0 Fall Risk Category Calculator: 0 Patient Fall Risk Level: Low Fall Risk  Fall Risk Patient at Risk for Falls Due to: No Fall Risks Fall risk Follow up: Falls evaluation completed; Falls prevention discussed  Home and Transportation Safety: All rugs have non-skid backing?: yes All stairs or steps have railings?: yes Grab bars in the bathtub or shower?: (!) no Have non-skid surface  in bathtub or shower?: yes Good home lighting?: yes Regular seat belt use?: yes Hospital stays in the last year:: no  Cognitive Assessment Difficulty concentrating, remembering, or making decisions? : yes Will 6CIT or Mini Cog be Completed: yes What year is it?: 0 points What month is it?: 0 points Give patient an address phrase to remember (5 components): [address] [place], [place] About what time is it?: 0 points Count backwards from 20 to 1: 0 points Say the months of the year in reverse: 0 points Repeat the address phrase from earlier: 0 points 6 CIT Score: 0 points  Advance Directives (For Healthcare) Does Patient Have a Medical Advance Directive?: No Would patient like information on creating a medical advance directive?: No - Patient declined  Reviewed/Updated  Reviewed/Updated: Reviewed All (Medical, Surgical, Family, Medications, Allergies, Care Teams, Patient Goals)    Allergies (verified) Sulfa antibiotics, Tetracycline, Tetracyclines & related, Statins, Bempedoic acid , Colestipol hcl, Corn-containing products, Pravastatin sodium, Shellfish allergy, Sulfamethoxazole-trimethoprim, Welchol  [colesevelam ], and Zetia  [ezetimibe ]   Current Medications (verified) Outpatient Encounter Medications as of 11/03/2024  Medication Sig   alendronate  (FOSAMAX ) 70 MG tablet TAKE 1 TABLET(70 MG) BY MOUTH 1 TIME A WEEK WITH A FULL GLASS OF WATER AND ON AN EMPTY STOMACH   allopurinol  (ZYLOPRIM ) 300 MG tablet Take 1 tablet (300 mg total) by mouth in the morning.   cetirizine (ZYRTEC) 10 MG tablet Take 10 mg by mouth in the morning.   Cholecalciferol 125 MCG (5000 UT) TABS Take 5,000 Units by mouth.   Coenzyme Q10 (COQ10) 100 MG CAPS Take 100 mg by mouth in the morning.   Cyanocobalamin  (VITAMIN B-12) 5000 MCG SUBL Place 5,000 mcg under the tongue in the morning.   cycloSPORINE (RESTASIS) 0.05 %  ophthalmic emulsion    DERMOTIC 0.01 % OIL Apply 1 application  topically See admin  instructions. 1 application in the ears once or twice weekly   DESCOVY 200-25 MG tablet Take 1 tablet by mouth daily.   famotidine (PEPCID) 20 MG tablet Take 20 mg by mouth daily as needed for heartburn or indigestion.   FLUoxetine HCl (PROZAC PO) Take by mouth daily.   fluticasone  (FLONASE ) 50 MCG/ACT nasal spray Place 1 spray into both nostrils daily as needed for allergies or rhinitis.   HYDROcodone -acetaminophen  (NORCO) 5-325 MG tablet Take 1 tablet by mouth every 6 (six) hours as needed for moderate pain.   L-Arginine 1000 MG TABS Take 1,000 mg by mouth 3 (three) times a week.   Multiple Vitamin (MULTIVITAMIN WITH MINERALS) TABS tablet Take 1 tablet by mouth in the morning.   Multiple Vitamins-Minerals (PRESERVISION AREDS 2 PO) Take 1 tablet by mouth in the morning.   niacin 500 MG tablet Take 500 mg by mouth in the morning.   tadalafil  (CIALIS ) 5 MG tablet Take 1 tablet (5 mg total) by mouth every morning.   testosterone  enanthate (DELATESTRYL) 200 MG/ML injection SMARTSIG:Milliliter(s) IM   alfuzosin (UROXATRAL) 10 MG 24 hr tablet Take 10 mg by mouth daily. (Patient not taking: Reported on 11/03/2024)   buPROPion (WELLBUTRIN XL) 150 MG 24 hr tablet Take 150 mg by mouth daily.   Elderberry 500 MG CAPS Take by mouth. (Patient not taking: Reported on 11/03/2024)   Multiple Vitamin (MULTI-VITAMIN) tablet Take 1 tablet by mouth daily.   Niacin, Antihyperlipidemic, 500 MG TABS Take 500 mg by mouth.   zinc gluconate 50 MG tablet Take 50 mg by mouth daily. (Patient not taking: Reported on 11/03/2024)   No facility-administered encounter medications on file as of 11/03/2024.    History: Past Medical History:  Diagnosis Date   Allergy    Anxiety    Arthritis    BPH (benign prostatic hypertrophy)    Cataract    Colon polyp    Depression    Elevated red blood cell count    Erectile dysfunction    GERD (gastroesophageal reflux disease)    Gout    History of chicken pox    History of  measles    History of mumps    Hyperlipidemia    IBS (irritable bowel syndrome)    MRSA infection 2000   nasal   Osteoporosis    Skin cancer (melanoma) (HCC) 2021   left arm   Past Surgical History:  Procedure Laterality Date   aortic ultrasound  2013   normal. Scre[facility] ultrasound [facility] per pt report   BCC Excised from chest  1984   BUNIONECTOMY Right    great toe   CATARACT EXTRACTION, BILATERAL     CHOLECYSTECTOMY  1997   COLONOSCOPY     COSMETIC SURGERY     EYE SURGERY     FRACTURE SURGERY     INGUINAL HERNIA REPAIR  [phone]   2 on right/1 on left   IR KYPHO LUMBAR INC FX REDUCE BONE BX UNI/BIL CANNULATION INC/IMAGING  02/16/2022   IR RADIOLOGIST EVAL & MGMT  01/30/2022   IR RADIOLOGIST EVAL & MGMT  02/27/2022   JOINT REPLACEMENT  Jan 10 2021   Partial rt knee   KYPHOPLASTY N/A 04/03/2021   Procedure: L1 KYPHOPLASTY AND BONE BIOPDY;  Surgeon: Kathlynn Sharper, MD;  Location: [facility] ORS;  Service: Orthopedics;  Laterality: N/A;   LIPOMA EXCISION  2013   NOSE  SURGERY     divided septum   PARTIAL KNEE ARTHROPLASTY Right 01/10/2021   Procedure: UNICOMPARTMENTAL KNEE;  Surgeon: Edie Norleen PARAS, MD;  Location: [facility] ORS;  Service: Orthopedics;  Laterality: Right;   PENILE PROSTHESIS IMPLANT  12/17/2013   POLYPECTOMY     PROSTATE SURGERY     Rezum procedure done in office   RIH  1987   X2   SHOULDER ARTHROSCOPY  1984 and 1987   left X2 for spurs   TONSILLECTOMY AND ADENOIDECTOMY  2002   UMBILICAL HERNIA REPAIR     Family History  Problem Relation Age of Onset   Hypothyroidism Mother    Diabetes Brother    Obesity Brother    Cancer Brother        throat   Social History   Occupational History   Occupation: retired  Tobacco Use   Smoking status: Former    Current packs/day: 0.00    Average packs/day: 0.8 packs/day for 5.0 years (3.8 ttl pk-yrs)    Types: Cigarettes    Start date: 11/19/1953    Quit date: 11/19/1958    Years since quitting: 66.0   Smokeless  tobacco: Never  Vaping Use   Vaping status: Never Used  Substance and Sexual Activity   Alcohol use: Yes    Alcohol/week: 0.0 standard drinks of alcohol    Comment: 2 glasses of wine a month   Drug use: No   Sexual activity: Not on file   Tobacco Counseling Counseling given: Not Answered  SDOH Screenings   Food Insecurity: No Food Insecurity (10/30/2024)  Housing: Low Risk (10/30/2024)  Transportation Needs: No Transportation Needs (10/30/2024)  Utilities: Not At Risk (06/30/2024)   Received from [facility]  Alcohol Screen: Low Risk (06/11/2024)  Depression (PHQ2-9): Medium Risk (06/12/2024)  Financial Resource Strain: Low Risk (10/30/2024)  Physical Activity: Insufficiently Active (10/30/2024)  Social Connections: Moderately Isolated (10/30/2024)  Stress: No Stress Concern Present (10/30/2024)  Tobacco Use: Medium Risk (11/03/2024)  Health Literacy: Adequate Health Literacy (10/29/2023)   See flowsheets for full screening details  Depression Screen PHQ 2 & 9 Depression Scale- Over the past 2 weeks, how often have you been bothered by any of the following problems? Little interest or pleasure in doing things: 1 Feeling down, depressed, or hopeless (PHQ Adolescent also includes...irritable): 1 PHQ-2 Total Score: 2 Trouble falling or staying asleep, or sleeping too much: 1 Feeling tired or having little energy: 2 Poor appetite or overeating (PHQ Adolescent also includes...weight loss): 1 Feeling bad about yourself - or that you are a failure or have let yourself or your family down: 0 Trouble concentrating on things, such as reading the newspaper or watching television (PHQ Adolescent also includes...like school work): 1 Moving or speaking so slowly that other people could have noticed. Or the opposite - being so fidgety or restless that you have been moving around a lot more than usual: 0 Thoughts that you would be better off dead, or of hurting yourself in  some way: 0 PHQ-9 Total Score: 7 If you checked off any problems, how difficult have these problems made it for you to do your work, take care of things at home, or get along with other people?: Somewhat difficult     Goals Addressed             This Visit's Progress    Cut out extra servings               Objective:  Today's Vitals   11/03/24 0854  BP: (!) 142/76  Weight: 194 lb 1.6 oz (88 kg)  Height: 5' 8 (1.727 m)   Body mass index is 29.51 kg/m.  Hearing/Vision screen No results found. Immunizations and Health Maintenance Health Maintenance  Topic Date Due   Bone Density Scan  06/06/2024   Influenza Vaccine  06/19/2024   COVID-19 Vaccine (6 - 2025-26 season) 07/20/2024   Medicare Annual Wellness (AWV)  11/03/2025   DTaP/Tdap/Td (3 - Td or Tdap) 12/28/2027   Pneumococcal Vaccine: 50+ Years  Completed   Zoster Vaccines- Shingrix  Completed   Meningococcal B Vaccine  Aged Out   Colonoscopy  Discontinued        Assessment/Plan:  This is a routine wellness examination for Damiel.  Patient Care Team: Gasper Nancyann BRAVO, MD as PCP - General (Family Medicine) Darliss Rogue, MD as PCP - Cardiology (Cardiology) Carolee Manus DASEN., MD as Consulting Physician (Ophthalmology) Carolee Sherwood JONETTA DOUGLAS, MD as Consulting Physician (Urology) Poggi, Norleen PARAS, MD as Consulting Physician (Orthopedic Surgery) Avanell Katz, MD as Referring Physician (Physical Medicine and Rehabilitation) Mona Vinie BROCKS, MD as Consulting Physician (Cardiology)  I have personally reviewed and noted the following in the patients chart:   Medical and social history Use of alcohol, tobacco or illicit drugs  Current medications and supplements including opioid prescriptions. Functional ability and status Nutritional status Physical activity Advanced directives List of other physicians Hospitalizations, surgeries, and ER visits in previous 12 months Vitals Screenings to include  cognitive, depression, and falls Referrals and appointments  No orders of the defined types were placed in this encounter.  In addition, I have reviewed and discussed with patient certain preventive protocols, quality metrics, and best practice recommendations. A written personalized care plan for preventive services as well as general preventive health recommendations were provided to patient.   Jhonnie GORMAN Das, LPN   [id number]   Return in 1 year (on 11/03/2025).  After Visit Summary: (In Person-Declined) Patient declined AVS at this time.  Nurse Notes: UTD ON SHOTS; HAS ORDER FOR BDS; AGED OUT OF COLONOSCOPY

## 2024-11-06 ENCOUNTER — Encounter: Payer: Self-pay | Admitting: Family Medicine

## 2024-11-06 ENCOUNTER — Ambulatory Visit: Admitting: Family Medicine

## 2024-11-06 VITALS — BP 144/80 | HR 79 | Temp 97.7°F | Ht 68.0 in | Wt 192.2 lb

## 2024-11-06 DIAGNOSIS — E049 Nontoxic goiter, unspecified: Secondary | ICD-10-CM

## 2024-11-06 DIAGNOSIS — E559 Vitamin D deficiency, unspecified: Secondary | ICD-10-CM | POA: Diagnosis not present

## 2024-11-06 DIAGNOSIS — E291 Testicular hypofunction: Secondary | ICD-10-CM

## 2024-11-06 DIAGNOSIS — F32A Depression, unspecified: Secondary | ICD-10-CM | POA: Diagnosis not present

## 2024-11-06 DIAGNOSIS — F419 Anxiety disorder, unspecified: Secondary | ICD-10-CM | POA: Diagnosis not present

## 2024-11-06 DIAGNOSIS — E782 Mixed hyperlipidemia: Secondary | ICD-10-CM | POA: Diagnosis not present

## 2024-11-06 DIAGNOSIS — N6321 Unspecified lump in the left breast, upper outer quadrant: Secondary | ICD-10-CM

## 2024-11-06 DIAGNOSIS — M810 Age-related osteoporosis without current pathological fracture: Secondary | ICD-10-CM | POA: Diagnosis not present

## 2024-11-06 NOTE — Progress Notes (Signed)
 "     Established patient visit   Patient: Larry Crawford   DOB: [birth year]   83 y.o. Male  MRN: [id number] Visit Date: 11/06/2024  Today's healthcare provider: Nancyann Perry, MD    Subjective    Discussed the use of AI scribe software for clinical note transcription with the patient, who gave verbal consent to proceed.  History of Present Illness   Larry Crawford is an 83 year old male who presents for routine follow up of his chronic conditions and with a concern of a lump in his left breast.  He noticed a lump in his left breast approximately four to five months ago, which is sometimes sore.  He has a history of thyroid  issues, having undergone a thyroidectomy in February due to a cancerous nodule. A small nodule on the right side is being monitored with ultrasounds. He associates his thyroid  issues with potential environmental hazards during [place] [employer].  He is taking allopurinol  300 mg for gout, which he initially reduced but had to increase back to 300 mg due to recurrence of symptoms. Since returning to the full dose, he reports no further issues with gout.  He is on Descovy, which he believes is related to a risk of HIV from a previous operation, though he is unsure of the specifics.  He is taking fluoxetine (Prozac) and reports that his mood has been stable. He also uses testosterone  injections, prescribed by his endocrinologist.  He reports no issues with hearing, although he is supposed to wear hearing aids.  He monitors his blood pressure at home, which ranges from 124 to 138 systolic. It was slightly elevated during the nurse's check but is generally better at home.     Lab Results  Component Value Date   HGBA1C 6.0% 02/24/2024   Lab Results  Component Value Date   CHOL 304 (H) 09/21/2022   HDL 40 09/21/2022   LDLCALC 214 (H) 09/21/2022   TRIG 250 (H) 09/21/2022   CHOLHDL 7.6 (H) 09/21/2022   Lab Results  Component Value Date   NA  132 (L) 07/30/2023   K 4.9 07/30/2023   CREATININE 1.37 (H) 07/30/2023   EGFR 51 (L) 07/30/2023   GLUCOSE 107 (H) 07/30/2023    Medications:  Show/hide medication list[1]     Objective    BP (!) 144/80 (BP Location: Left Arm, Patient Position: Sitting, Cuff Size: Normal)   Pulse 79   Temp 97.7 F (36.5 C) (Oral)   Ht 5' 8 (1.727 m)   Wt 192 lb 3.2 oz (87.2 kg)   SpO2 98%   BMI 29.22 kg/m   Physical Exam   General: Appearance:    Well developed, well nourished male in no acute distress  Eyes:    PERRL, conjunctiva/corneas clear, EOM's intact       Lungs:     Clear to auscultation bilaterally, respirations unlabored  Chest:   Discrete pea sized, non tender nodule just lateral and superior to left areola  Heart:    Normal heart rate. Normal rhythm. No murmurs, rubs, or gallops.    MS:   All extremities are intact.    Neurologic:   Awake, alert, oriented x 3. No apparent focal neurological defect.          Assessment & Plan       Left breast mass Palpable mass for 4-5 months, differential includes benign gynecomastia and less likely breast cancer. Possible relation to testosterone  therapy. -  Ordered breast ultrasound.  Mixed hyperlipidemia Due for routine cholesterol check. - Ordered cholesterol panel.  Vitamin D  deficiency Due for routine vitamin D  level check. - Ordered vitamin D  level.  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  Depression and anxiety On fluoxetine with good mood control.  Gout On allopurinol  300 mg, no recent flares since dosage increase.  History of thyroid  cancer, post-thyroidectomy Scheduled for thyroidectomy in February. Small nodule on right side to be monitored. Under care of endocrinologist and thyroid  surgeon.  General health maintenance Routine labs due for vitamin D  and cholesterol. Blood pressure readings at home acceptable. - Refilled Cialis  prescription. - Ordered routine labs including vitamin D  and  cholesterol.   Goiter  Age related osteoporosis, unspecified pathological fracture presence BMD scheduled in January     Nancyann Perry, MD  [facility] [phone] (phone) [phone] (fax)  [facility]     [1]  Outpatient Medications Prior to Visit  Medication Sig Note   alendronate  (FOSAMAX ) 70 MG tablet TAKE 1 TABLET(70 MG) BY MOUTH 1 TIME A WEEK WITH A FULL GLASS OF WATER AND ON AN EMPTY STOMACH    allopurinol  (ZYLOPRIM ) 300 MG tablet Take 1 tablet (300 mg total) by mouth in the morning.    cetirizine (ZYRTEC) 10 MG tablet Take 10 mg by mouth in the morning.    Cholecalciferol 125 MCG (5000 UT) TABS Take 5,000 Units by mouth.    Coenzyme Q10 (COQ10) 100 MG CAPS Take 100 mg by mouth in the morning.    Cyanocobalamin  (VITAMIN B-12) 5000 MCG SUBL Place 5,000 mcg under the tongue in the morning.    cycloSPORINE (RESTASIS) 0.05 % ophthalmic emulsion     DERMOTIC 0.01 % OIL Apply 1 application  topically See admin instructions. 1 application in the ears once or twice weekly    DESCOVY 200-25 MG tablet Take 1 tablet by mouth daily.    famotidine (PEPCID) 20 MG tablet Take 20 mg by mouth daily as needed for heartburn or indigestion.    FLUoxetine HCl (PROZAC PO) Take by mouth daily.    fluticasone  (FLONASE ) 50 MCG/ACT nasal spray Place 1 spray into both nostrils daily as needed for allergies or rhinitis.    HYDROcodone -acetaminophen  (NORCO) 5-325 MG tablet Take 1 tablet by mouth every 6 (six) hours as needed for moderate pain.    L-Arginine 1000 MG TABS Take 1,000 mg by mouth 3 (three) times a week.    Multiple Vitamin (MULTI-VITAMIN) tablet Take 1 tablet by mouth daily.    Multiple Vitamin (MULTIVITAMIN WITH MINERALS) TABS tablet Take 1 tablet by mouth in the morning.    Multiple Vitamins-Minerals (PRESERVISION AREDS 2 PO) Take 1 tablet by mouth in the morning.    niacin 500 MG tablet Take 500 mg by mouth in the morning.    Niacin,  Antihyperlipidemic, 500 MG TABS Take 500 mg by mouth.    tadalafil  (CIALIS ) 5 MG tablet Take 1 tablet (5 mg total) by mouth every morning.    testosterone  enanthate (DELATESTRYL) 200 MG/ML injection SMARTSIG:Milliliter(s) IM    zinc gluconate 50 MG tablet Take 50 mg by mouth daily.    [DISCONTINUED] alfuzosin (UROXATRAL) 10 MG 24 hr tablet Take 10 mg by mouth daily. (Patient not taking: Reported on 11/03/2024) 11/06/2024: dizziness   [DISCONTINUED] buPROPion (WELLBUTRIN XL) 150 MG 24 hr tablet Take 150 mg by mouth daily.    [DISCONTINUED] Elderberry 500 MG CAPS Take by mouth. (Patient not taking: Reported on 11/03/2024)  No facility-administered medications prior to visit.   "

## 2024-11-06 NOTE — Patient Instructions (Signed)
 SABRA  Please review the attached list of medications and notify my office if there are any errors.   . Please bring all of your medications to every appointment so we can make sure that our medication list is the same as yours.

## 2024-11-07 LAB — COMPREHENSIVE METABOLIC PANEL WITH GFR
ALT: 15 IU/L (ref 0–44)
AST: 19 IU/L (ref 0–40)
Albumin: 4.5 g/dL (ref 3.7–4.7)
Alkaline Phosphatase: 77 IU/L (ref 48–129)
BUN/Creatinine Ratio: 15 (ref 10–24)
BUN: 26 mg/dL (ref 8–27)
Bilirubin Total: 0.4 mg/dL (ref 0.0–1.2)
CO2: 25 mmol/L (ref 20–29)
Calcium: 10 mg/dL (ref 8.6–10.2)
Chloride: 97 mmol/L (ref 96–106)
Creatinine, Ser: 1.73 mg/dL — ABNORMAL HIGH (ref 0.76–1.27)
Globulin, Total: 2.3 g/dL (ref 1.5–4.5)
Glucose: 97 mg/dL (ref 70–99)
Potassium: 4.8 mmol/L (ref 3.5–5.2)
Sodium: 135 mmol/L (ref 134–144)
Total Protein: 6.8 g/dL (ref 6.0–8.5)
eGFR: 38 mL/min/1.73 — ABNORMAL LOW

## 2024-11-07 LAB — LIPID PANEL
Chol/HDL Ratio: 6 ratio — ABNORMAL HIGH (ref 0.0–5.0)
Cholesterol, Total: 217 mg/dL — ABNORMAL HIGH (ref 100–199)
HDL: 36 mg/dL — ABNORMAL LOW
LDL Chol Calc (NIH): 145 mg/dL — ABNORMAL HIGH (ref 0–99)
Triglycerides: 196 mg/dL — ABNORMAL HIGH (ref 0–149)
VLDL Cholesterol Cal: 36 mg/dL (ref 5–40)

## 2024-11-07 LAB — CBC
Hematocrit: 46.6 % (ref 37.5–51.0)
Hemoglobin: 16 g/dL (ref 13.0–17.7)
MCH: 31.9 pg (ref 26.6–33.0)
MCHC: 34.3 g/dL (ref 31.5–35.7)
MCV: 93 fL (ref 79–97)
Platelets: 366 x10E3/uL (ref 150–450)
RBC: 5.01 x10E6/uL (ref 4.14–5.80)
RDW: 13.9 % (ref 11.6–15.4)
WBC: 6.7 x10E3/uL (ref 3.4–10.8)

## 2024-11-07 LAB — VITAMIN D 25 HYDROXY (VIT D DEFICIENCY, FRACTURES): Vit D, 25-Hydroxy: 50.6 ng/mL (ref 30.0–100.0)

## 2024-11-08 ENCOUNTER — Ambulatory Visit: Payer: Self-pay | Admitting: Family Medicine

## 2024-11-23 ENCOUNTER — Ambulatory Visit
Admission: RE | Admit: 2024-11-23 | Discharge: 2024-11-23 | Disposition: A | Discharge status: Home / Self Care | Source: Ambulatory Visit | Attending: Family Medicine | Admitting: Family Medicine

## 2024-11-23 ENCOUNTER — Other Ambulatory Visit

## 2024-11-23 DIAGNOSIS — N6321 Unspecified lump in the left breast, upper outer quadrant: Secondary | ICD-10-CM

## 2024-11-23 DIAGNOSIS — M810 Age-related osteoporosis without current pathological fracture: Secondary | ICD-10-CM

## 2024-11-24 ENCOUNTER — Other Ambulatory Visit

## 2024-11-25 ENCOUNTER — Ambulatory Visit: Payer: Self-pay | Admitting: Family Medicine

## 2024-11-27 ENCOUNTER — Telehealth (HOSPITAL_COMMUNITY): Payer: Self-pay | Admitting: Pharmacist

## 2024-11-27 NOTE — Telephone Encounter (Signed)
 Leqvio  treatment plan discontinued as patient does not want to continue treatment  Marvie Calender, PharmD, MPH, BCPS, CPP Clinical Pharmacist

## 2025-11-09 ENCOUNTER — Ambulatory Visit
